# Patient Record
Sex: Male | Born: 1955 | Race: White | Hispanic: No | Marital: Married | Smoking: Never smoker
Health system: Southern US, Community
[De-identification: ages and names within clinical notes are randomized; demographics above are authoritative.]

## PROBLEM LIST (undated history)

## (undated) DIAGNOSIS — E782 Mixed hyperlipidemia: Secondary | ICD-10-CM

## (undated) DIAGNOSIS — I10 Essential (primary) hypertension: Secondary | ICD-10-CM

## (undated) DIAGNOSIS — E669 Obesity, unspecified: Secondary | ICD-10-CM

## (undated) DIAGNOSIS — K219 Gastro-esophageal reflux disease without esophagitis: Secondary | ICD-10-CM

## (undated) DIAGNOSIS — I2510 Atherosclerotic heart disease of native coronary artery without angina pectoris: Secondary | ICD-10-CM

## (undated) HISTORY — DX: Obesity, unspecified: E66.9

## (undated) HISTORY — DX: Atherosclerotic heart disease of native coronary artery without angina pectoris: I25.10

## (undated) HISTORY — DX: Essential (primary) hypertension: I10

## (undated) HISTORY — DX: Mixed hyperlipidemia: E78.2

## (undated) HISTORY — DX: Gastro-esophageal reflux disease without esophagitis: K21.9

---

## 2008-01-18 ENCOUNTER — Ambulatory Visit: Payer: Self-pay | Admitting: Cardiology

## 2008-01-18 ENCOUNTER — Inpatient Hospital Stay (HOSPITAL_COMMUNITY): Admission: EM | Admit: 2008-01-18 | Discharge: 2008-01-21 | Payer: Self-pay | Admitting: Emergency Medicine

## 2008-01-28 ENCOUNTER — Ambulatory Visit: Payer: Self-pay | Admitting: Cardiology

## 2008-02-29 ENCOUNTER — Ambulatory Visit: Payer: Self-pay | Admitting: Cardiology

## 2008-02-29 LAB — CONVERTED CEMR LAB
Bilirubin, Direct: 0.1 mg/dL (ref 0.0–0.3)
Calcium: 9.5 mg/dL (ref 8.4–10.5)
Creatinine, Ser: 0.9 mg/dL (ref 0.4–1.5)
Direct LDL: 87 mg/dL
GFR calc Af Amer: 114 mL/min
Glucose, Bld: 192 mg/dL — ABNORMAL HIGH (ref 70–99)
HDL: 32.4 mg/dL — ABNORMAL LOW (ref >39.0)
Hgb A1c MFr Bld: 10.8 % — ABNORMAL HIGH (ref 4.6–6.0)
Sodium: 139 meq/L (ref 135–145)
Total Bilirubin: 0.9 mg/dL (ref 0.3–1.2)
Total CHOL/HDL Ratio: 4.7
Total Protein: 7 g/dL (ref 6.0–8.3)
Triglycerides: 249 mg/dL (ref 0–149)

## 2008-03-16 ENCOUNTER — Ambulatory Visit: Payer: Self-pay | Admitting: Cardiology

## 2008-06-22 ENCOUNTER — Ambulatory Visit: Payer: Self-pay | Admitting: Cardiology

## 2008-06-27 ENCOUNTER — Ambulatory Visit: Payer: Self-pay | Admitting: Cardiology

## 2008-06-27 ENCOUNTER — Ambulatory Visit: Payer: Self-pay

## 2008-06-27 LAB — CONVERTED CEMR LAB
ALT: 29 units/L (ref 0–53)
AST: 24 units/L (ref 0–37)
Albumin: 3.8 g/dL (ref 3.5–5.2)
Alkaline Phosphatase: 66 units/L (ref 39–117)
BUN: 17 mg/dL (ref 6–23)
Chloride: 101 meq/L (ref 96–112)
Cholesterol: 118 mg/dL (ref 0–200)
Creatinine, Ser: 0.7 mg/dL (ref 0.4–1.5)
GFR calc non Af Amer: 126 mL/min
Glucose, Bld: 212 mg/dL — ABNORMAL HIGH (ref 70–99)
Hgb A1c MFr Bld: 9.1 % — ABNORMAL HIGH (ref 4.6–6.0)
Total Protein: 6.7 g/dL (ref 6.0–8.3)
VLDL: 42 mg/dL — ABNORMAL HIGH (ref 0–40)

## 2008-09-28 ENCOUNTER — Ambulatory Visit: Payer: Self-pay | Admitting: Cardiology

## 2008-10-28 DIAGNOSIS — D72829 Elevated white blood cell count, unspecified: Secondary | ICD-10-CM | POA: Insufficient documentation

## 2008-10-28 DIAGNOSIS — E785 Hyperlipidemia, unspecified: Secondary | ICD-10-CM

## 2008-10-28 DIAGNOSIS — E669 Obesity, unspecified: Secondary | ICD-10-CM

## 2008-10-28 DIAGNOSIS — K219 Gastro-esophageal reflux disease without esophagitis: Secondary | ICD-10-CM

## 2008-10-28 DIAGNOSIS — E1169 Type 2 diabetes mellitus with other specified complication: Secondary | ICD-10-CM | POA: Insufficient documentation

## 2008-12-22 ENCOUNTER — Encounter: Payer: Self-pay | Admitting: Internal Medicine

## 2008-12-22 ENCOUNTER — Inpatient Hospital Stay (HOSPITAL_COMMUNITY): Admission: EM | Admit: 2008-12-22 | Discharge: 2008-12-23 | Payer: Self-pay | Admitting: Emergency Medicine

## 2008-12-22 ENCOUNTER — Ambulatory Visit: Payer: Self-pay | Admitting: Internal Medicine

## 2008-12-22 HISTORY — PX: CARDIAC CATHETERIZATION: SHX172

## 2009-01-06 ENCOUNTER — Ambulatory Visit: Payer: Self-pay | Admitting: Cardiology

## 2009-03-01 ENCOUNTER — Encounter: Payer: Self-pay | Admitting: Cardiology

## 2009-03-02 ENCOUNTER — Telehealth (INDEPENDENT_AMBULATORY_CARE_PROVIDER_SITE_OTHER): Payer: Self-pay | Admitting: *Deleted

## 2009-03-07 ENCOUNTER — Telehealth: Payer: Self-pay | Admitting: Cardiology

## 2009-03-09 ENCOUNTER — Telehealth: Payer: Self-pay | Admitting: Cardiology

## 2009-03-23 ENCOUNTER — Encounter: Payer: Self-pay | Admitting: Cardiology

## 2009-03-29 ENCOUNTER — Ambulatory Visit: Payer: Self-pay | Admitting: Cardiology

## 2009-03-30 LAB — CONVERTED CEMR LAB
Albumin: 4.2 g/dL (ref 3.5–5.2)
BUN: 16 mg/dL (ref 6–23)
CO2: 30 meq/L (ref 19–32)
Calcium: 9.7 mg/dL (ref 8.4–10.5)
Creatinine, Ser: 1 mg/dL (ref 0.4–1.5)
GFR calc non Af Amer: 83.29 mL/min (ref >60)
Glucose, Bld: 225 mg/dL — ABNORMAL HIGH (ref 70–99)
Hgb A1c MFr Bld: 9 % — ABNORMAL HIGH (ref 4.6–6.5)
Sodium: 138 meq/L (ref 135–145)
Total Protein: 7.1 g/dL (ref 6.0–8.3)

## 2009-05-22 ENCOUNTER — Encounter: Payer: Self-pay | Admitting: Cardiology

## 2009-06-08 ENCOUNTER — Telehealth (INDEPENDENT_AMBULATORY_CARE_PROVIDER_SITE_OTHER): Payer: Self-pay | Admitting: *Deleted

## 2009-06-23 DIAGNOSIS — I2510 Atherosclerotic heart disease of native coronary artery without angina pectoris: Secondary | ICD-10-CM

## 2009-07-12 ENCOUNTER — Telehealth (INDEPENDENT_AMBULATORY_CARE_PROVIDER_SITE_OTHER): Payer: Self-pay | Admitting: *Deleted

## 2009-08-24 ENCOUNTER — Ambulatory Visit: Payer: Self-pay | Admitting: Cardiology

## 2009-09-26 ENCOUNTER — Telehealth (INDEPENDENT_AMBULATORY_CARE_PROVIDER_SITE_OTHER): Payer: Self-pay | Admitting: *Deleted

## 2009-12-21 ENCOUNTER — Ambulatory Visit: Payer: Self-pay | Admitting: Cardiology

## 2010-01-05 ENCOUNTER — Telehealth (INDEPENDENT_AMBULATORY_CARE_PROVIDER_SITE_OTHER): Payer: Self-pay | Admitting: *Deleted

## 2010-03-22 ENCOUNTER — Telehealth (INDEPENDENT_AMBULATORY_CARE_PROVIDER_SITE_OTHER): Payer: Self-pay | Admitting: *Deleted

## 2010-04-05 ENCOUNTER — Telehealth: Payer: Self-pay | Admitting: Cardiology

## 2010-07-12 ENCOUNTER — Ambulatory Visit: Payer: Self-pay | Admitting: Cardiology

## 2010-07-12 ENCOUNTER — Encounter: Payer: Self-pay | Admitting: Cardiology

## 2010-07-12 DIAGNOSIS — I10 Essential (primary) hypertension: Secondary | ICD-10-CM

## 2010-08-21 NOTE — Progress Notes (Signed)
   Walk in Patient Form Recieved " Pt left [employer] form for completion" sent to Maryan Puls Mesiemore  March 22, 2010 12:39 PM

## 2010-08-21 NOTE — Assessment & Plan Note (Signed)
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  History of Present Illness: Mr. Mccarrick returns today for evaluation and management of his coronary disease.  Was driving back from [place] and began to have problems with feeling hot and cold and had some nausea. He was admitted to the hospital and told this was probably a virus. He had a stress dobutamine echo which showed ejection fraction of 60% that increased to 85% with greater than 85% of his maximum predicted heart rate being obtained. There was no evidence of ischemia or infarction.  His blood sugars were averaging around 200+ in the hospital. His last hemoglobin A1c that I obtained in September 2002 and was 9%. I reviewed this thoroughly with him today and the importance of tightness. His other risk factors other than his weight seemed to be well treated.  He currently has no symptoms of angina or ischemia  Current Medications (verified): 1)  Aspirin 81 Mg Tbec (Aspirin) .... Take One Tablet By Mouth Daily 2)  Metoprolol Tartrate 25 Mg Tabs (Metoprolol Tartrate) .[name].. 1 Tab Two Times A Day 3)  Plavix 75 Mg Tabs (Clopidogrel Bisulfate) .[name].. 1 Tab Once Daily 4)  Lisinopril 20 Mg Tabs (Lisinopril) .[name].. 1 Tab Once Daily 5)  Glipizide Xl 10 Mg Xr24h-Tab (Glipizide) .[name].. 1 Tab Once Daily 6)  Crestor 40 Mg Tabs (Rosuvastatin Calcium) .... Take One Tablet By Mouth Daily 7)  Metformin Hcl 500 Mg Xr24h-Tab (Metformin Hcl) .... 2 Tabs Two Times A Day  Allergies: 1)  ! Pcn  Past History:  Past Medical History: Last updated: 01/06/2009 CAD      a.  S/P NSTEMI JULY 2009      b.  S/P BMS TO RCA  JULY 2009      c.  S/P XIENCE DES TO OM2 JUNE 2010.  OTW NONOBS DZS AND PATENT RCA STENT, EF 65% HYPERLIPIDEMIA-MIXED (ICD-272.4) OBESITY (ICD-278.00) GERD (ICD-530.81) DM (ICD-250.00) HTN    Family History: Last updated: 01/04/2009  Mother is deceased at the age 9 with the cerebral   aneurysm and history of diabetes.  Father died at the age of 48 with   history of COPD and bone and lung cancer.  He has one sister, who is   currently being treated for lymphoma.   Social History: Last updated: 2009-01-04 + h/o tobacco, now quit  Review of Systems       negative other than history of present illness  Vital Signs:  Patient profile:   55 year old male Height:      70 inches Weight:      267 pounds BMI:     38.45 Pulse rate:   72 / minute Pulse rhythm:   regular BP sitting:   100 / 66  (left arm) Cuff size:   large  Vitals Entered By: Danielle Rankin, CMA (December 21, 2009 8:47 AM)  Physical Exam  General:  anxious, no acute distress otherwise. Head:  long beard Eyes:  PERRLA/EOM intact; conjunctiva and lids normal. Neck:  Neck supple, no JVD. No masses, thyromegaly or abnormal cervical nodes.  Chest Evian Derringer:  no deformities or breast masses noted Lungs:  Clear bilaterally to auscultation and percussion. Heart:  Non-displaced PMI, chest non-tender; regular rate and rhythm, S1, S2 without murmurs, rubs or gallops. Carotid upstroke normal, no bruit. Normal abdominal aortic size, no bruits. Femorals normal pulses, no bruits. Pedals normal pulses. No edema, no varicosities. Msk:  Back normal, normal gait. Muscle strength and tone normal. Pulses:  pulses normal in all 4 extremities Extremities:  No clubbing or cyanosis. Neurologic:  Alert and oriented x 3. Skin:  Intact without lesions or rashes. Psych:  Normal affect.   Impression & Recommendations:  Problem # 1:  CAD, NATIVE VESSEL (ICD-414.01) Assessment Unchanged I reviewed his outside dobutamine stress echo report with him.  His ejection fraction was 60% increasing to 85%. The test was adequate. There was no evidence of ischemia or infarction or Seyed Heffley motion abnormality. The biggest issues continue to try to control his blood sugar. He will continue to work with his primary care to get his hemoglobin A1c at goal. Per his history, his lipids are really low. He is compliant with his medications. No changes made. I will see him back in 6 months. His updated medication list for this problem includes:    Aspirin 81 Mg Tbec (Aspirin) .[name]... Take one tablet by mouth daily    Metoprolol Tartrate 25 Mg Tabs (Metoprolol tartrate) .[name]... 1 tab two times a day    Plavix 75 Mg Tabs (Clopidogrel bisulfate) .[name]... 1 tab once daily    Lisinopril 20 Mg Tabs (Lisinopril) .[name]... 1 tab once daily  Problem # 2:  HYPERLIPIDEMIA-MIXED (ICD-272.4)  His updated medication list for this problem includes:    Crestor 40 Mg Tabs (Rosuvastatin calcium) .[name]... Take one tablet by mouth daily  Problem # 3:  OBESITY (ICD-278.00) Assessment: Unchanged  Problem # 4:  DM (ICD-250.00) Assessment: Deteriorated  His updated medication list for this problem includes:    Aspirin 81 Mg Tbec (Aspirin) .[name]... Take one tablet by mouth daily    Lisinopril 20 Mg Tabs (Lisinopril) .[name]... 1 tab once daily    Glipizide Xl 10 Mg Xr24h-tab (Glipizide) .[name]... 1 tab once daily    Metformin Hcl 500 Mg Xr24h-tab (Metformin hcl) .[name]... 2 tabs two times a day  Patient Instructions: 1)  Your physician recommends that you schedule a follow-up appointment in: 6 months with dr Bush Murdoch 2)  Your physician recommends that you continue on your current medications as directed. Please refer to the Current Medication list given to you today.

## 2010-08-21 NOTE — Progress Notes (Signed)
Summary: calling back   Phone Note Outgoing Call   Call placed by: Ollen Gross, RN, BSN,  March 22, 2010 3:18 PM Call placed to: Patient Details for Reason: Walk-in patient note. Summary of Call: Needs forms completed asap. Initial call taken by: Ollen Gross, RN, BSN,  March 22, 2010 3:19 PM  Follow-up for Phone Call        Spoke with pt's wife to let pt/wife know that we received the forms for requesting assistance with two medications. I will give the forms to Debbie Dr. Vern Claude nurse. She and MD will be here tomorrow. Pt's wife verbalized understanding. Follow-up by: Ollen Gross, RN, BSN,  March 22, 2010 3:24 PM  Additional Follow-up for Phone Call Additional follow up Details #1::        Per pt wife calling back sylbil Charo [phone] .cell [phone] Lorne Skeens  March 23, 2010 10:26 AM    Mr. Peerson wife will pick up forms today. Mylo Red RN

## 2010-08-21 NOTE — Progress Notes (Signed)
   Phone Note Outgoing Call   Call placed by: Scherrie Bateman, LPN,  September 26, 2009 2:55 PM Call placed to: Patient'S  WIFE Summary of Call: CALL PLACED LEFT WORD WITH PT'S WIFE PLAVIX HERE WILL LEAVE AT FRONT DESK FOR PICK UP. Initial call taken by: Scherrie Bateman, LPN,  September 26, 2009 2:56 PM

## 2010-08-21 NOTE — Progress Notes (Signed)
   Phone Note Outgoing Call   Call placed by: Scherrie Bateman, LPN,  January 05, 2010 3:34 PM Call placed to: Patient's wife Summary of Call: PT'S WIFE AWARE [employer] LEFT AT FRONT DESK FOR PICK UP. Initial call taken by: Scherrie Bateman, LPN,  January 05, 2010 3:35 PM

## 2010-08-21 NOTE — Assessment & Plan Note (Signed)
Summary: [id number]/[id number]      Allergies Added: ! PCN  Visit Type:  3 mo f/u Primary Provider:  Yvonne Kendall  CC:  no cardiac complaints today.  History of Present Illness: Mr Nadeem returns today for evaluation manages coronary artery disease.  He's working down in [place] as he says he's bless with work. He is having no symptoms of angina or ischemia. He remains on Plavix. I thought he had a bare-metal stent placed in June but with a drug-eluting stent. A nurse in the hospital followed up with him and told him to stay on his Plavix.  He is a previous bare-metal stent in the right coronary artery and a drug-eluting stent in the circumflex marginal. He will need to be on Plavix until June of 2011.  His last cholesterol was 122, HDL 34 LDL 43. His hemoglobin A1c is high and his physician in [place] is working on this.  Current Medications (verified): 1)  Aspirin 81 Mg Tbec (Aspirin) .... Take One Tablet By Mouth Daily 2)  Metoprolol Tartrate 25 Mg Tabs (Metoprolol Tartrate) .[name].. 1 Tab Two Times A Day 3)  Plavix 75 Mg Tabs (Clopidogrel Bisulfate) .[name].. 1 Tab Once Daily 4)  Lisinopril 20 Mg Tabs (Lisinopril) .[name].. 1 Tab Once Daily 5)  Glipizide Xl 10 Mg Xr24h-Tab (Glipizide) .[name].. 1 Tab Once Daily 6)  Crestor 40 Mg Tabs (Rosuvastatin Calcium) .... Take One Tablet By Mouth Daily 7)  Metformin Hcl 500 Mg Xr24h-Tab (Metformin Hcl) .... Take One Tablet By Mouth Twice A Day  Allergies (verified): 1)  ! Pcn  Past History:  Past Medical History: Last updated: 01/06/2009 CAD      a.  S/P NSTEMI JULY 2009      b.  S/P BMS TO RCA JULY 2009      c.  S/P XIENCE DES TO OM2 JUNE 2010.  OTW NONOBS DZS AND PATENT RCA STENT, EF 65% HYPERLIPIDEMIA-MIXED (ICD-272.4) OBESITY (ICD-278.00) GERD (ICD-530.81) DM (ICD-250.00) HTN    Family History: Last updated: 01-03-2009  Mother is deceased at the age 34 with the cerebral   aneurysm and history of diabetes.  Father died at the age of 40 with   history  of COPD and bone and lung cancer.  He has one sister, who is   currently being treated for lymphoma.   Social History: Last updated: 01/03/09 + h/o tobacco, now quit  Review of Systems       negative other than history of present illness  Vital Signs:  Patient profile:   55 year old male Height:      70 inches Weight:      272 pounds BMI:     39.17 Pulse rate:   66 / minute Pulse rhythm:   regular BP sitting:   100 / 70  (left arm) Cuff size:   large  Vitals Entered By: Danielle Rankin, CMA (August 24, 2009 2:34 PM)  Physical Exam  General:  obese.  long beard Head:  normocephalic and atraumatic Eyes:  PERRLA/EOM intact; conjunctiva and lids normal. Neck:  Neck supple, no JVD. No masses, thyromegaly or abnormal cervical nodes. Lungs:  Clear bilaterally to auscultation and percussion. Heart:  Non-displaced PMI, chest non-tender; regular rate and rhythm, S1, S2 without murmurs, rubs or gallops. Carotid upstroke normal, no bruit. Normal abdominal aortic size, no bruits. Femorals normal pulses, no bruits. Pedals normal pulses. No edema, no varicosities. Msk:  Back normal, normal gait. Muscle strength and tone normal. Pulses:  pulses normal in  all 4 extremities Extremities:  No clubbing or cyanosis. Neurologic:  Alert and oriented x 3. Skin:  Intact without lesions or rashes. Psych:  Normal affect.   Impression & Recommendations:  Problem # 1:  CAD, NATIVE VESSEL (ICD-414.01)  His updated medication list for this problem includes:    Aspirin 81 Mg Tbec (Aspirin) .[name]... Take one tablet by mouth daily    Metoprolol Tartrate 25 Mg Tabs (Metoprolol tartrate) .[name]... 1 tab two times a day    Plavix 75 Mg Tabs (Clopidogrel bisulfate) .[name]... 1 tab once daily    Lisinopril 20 Mg Tabs (Lisinopril) .[name]... 1 tab once daily  Patient Instructions: 1)  Your physician recommends that you schedule a follow-up appointment in: JUNE 2011 WITH DR Jeffrey Graefe 2)  Your physician recommends that you  continue on your current medications as directed. Please refer to the Current Medication list given to you today.

## 2010-08-21 NOTE — Progress Notes (Signed)
Summary: Plavix- BMS   Phone Note Outgoing Call   Call placed by: Sherri Rad, RN, BSN,  April 05, 2010 3:42 PM Call placed to: Patient Summary of Call: I spoke with the pt's wife. I made her aware that the pt's Plavix has been received from the company and is at the front desk for p/u.  Initial call taken by: Sherri Rad, RN, BSN,  April 05, 2010 3:43 PM

## 2010-08-23 NOTE — Assessment & Plan Note (Signed)
Summary: 6 MO F/U ./CY    Visit Type:  6 mo f/u Primary Chad Maddox:  Yvonne Kendall  CC:  pt denies any cardiac complaints today.  History of Present Illness: Mr Word comes in today for followup of his coronary disease. He has had no angina or ischemic symptoms. He states he comply with his medications. His weight is not changed. He is in [place] most of time working. He has not been in 9 weeks. He is going back to day.  He denies any orthopnea, PND or edema  Clinical Reports Reviewed:  Cardiac Cath:  12/22/2008: Cardiac Cath Findings:   Left ventricular angiogram was performed in the RAO projection       shows normal left ventricular systolic function with no wall motion       abnormalities.  Ejection fraction is 65%.      IMPRESSION:   1. Triple-vessel coronary artery disease.   2. Patent stent in the proximal right coronary artery.   3. Severe stenosis of the second obtuse marginal branch.   4. Successful percutaneous coronary intervention with placement of a       Xience drug-eluting stent in the second obtuse marginal branch.   5. Normal left ventricular systolic function.      RECOMMENDATIONS:  I recommend continue aspirin and Plavix for at least   the next year.  The patient will also be continued on his statin and his   beta blocker.  He will be monitored closely in the telemetry unit   tonight and will be discharged home tomorrow if he is stable.               Verne Carrow, MD   Electronically Signed         01/20/2008: Cardiac Cath Findings:   Cardiac Findings:  , LVEF Status: LV Gram - 65 % EF, LV Wall Motion: Normal  Dominance: Right  Tubular 50% proximal lesion in LAD  Tubular 70% mid lesion in LAD  Tubular 95% ostial lesion in DIAG2  Tubular 40% proximal lesion in CIRC  Tubular 70% mid lesion in CIRC  Tubular 95% proximal lesion in RCA  Tubular 30% distal lesion in RCA  Conclusions:  1.  NSTEMI with 3V CAD wtih 50% proximal and 70% mid LAD  stensosis with 90%  subbranch Dx2, 40% proximal and 70% distal CFX stenosis, and 95% proximal and  30% distal RCA stenosis.  2.  Normal LVF with EF 65%  3.  Successfull PCI proximal RCA lesion with a BMS with improvement in  stenosis from 95% to 0%.  Recommendations:  Plavix for one year.  Intensive secondary prevention -- large plaque burden.  Signed By: Charlies Constable  MD On 01/20/2008 10:08:46  Charlies Constable  MD  cc Dr Juanito Doom  [facility]  Dr Charlies Constable  Nuclear Study:  06/27/2008:  Excerise capacity: Adenosine with low level exercise  Blood Pressure response:  Clinical symptoms:  ECG impression: Baseline: NSR; No significant ST segment change with adenosine and low-level exercise.  Overall impression: Normal stress nuclear study  Arvilla Meres, MD   Current Medications (verified): 1)  Aspirin 81 Mg Tbec (Aspirin) .... Take One Tablet By Mouth Daily 2)  Metoprolol Tartrate 25 Mg Tabs (Metoprolol Tartrate) .[name].. 1 Tab Two Times A Day 3)  Plavix 75 Mg Tabs (Clopidogrel Bisulfate) .[name].. 1 Tab Once Daily 4)  Lisinopril 20 Mg Tabs (Lisinopril) .[name].. 1 Tab Once Daily 5)  Glipizide Xl 10 Mg Xr24h-Tab (Glipizide) .Marland KitchenMarland KitchenMarland Kitchen  1 Tab Once Daily 6)  Crestor 40 Mg Tabs (Rosuvastatin Calcium) .... Take One Tablet By Mouth Daily 7)  Metformin Hcl 500 Mg Xr24h-Tab (Metformin Hcl) .... 2 Tabs Two Times A Day 8)  Nitrostat 0.4 Mg Subl (Nitroglycerin) .[name].. 1 Tablet Under Tongue At Onset of Chest Pain; You May Repeat Every 5 Minutes For Up To 3 Doses.  Allergies: 1)  ! Pcn  Past History:  Past Medical History: Last updated: 01/06/2009 CAD      a.  S/P NSTEMI JULY 2009      b.  S/P BMS TO RCA JULY 2009      c.  S/P XIENCE DES TO OM2 JUNE 2010.  OTW NONOBS DZS AND PATENT RCA STENT, EF 65% HYPERLIPIDEMIA-MIXED (ICD-272.4) OBESITY (ICD-278.00) GERD (ICD-530.81) DM (ICD-250.00) HTN    Family History: Last updated: Jan 09, 2009  Mother is deceased at the age 26 with the cerebral    aneurysm and history of diabetes.  Father died at the age of 67 with   history of COPD and bone and lung cancer.  He has one sister, who is   currently being treated for lymphoma.   Social History: Last updated: January 09, 2009 + h/o tobacco, now quit  Review of Systems       negative other than history of present illness  Vital Signs:  Patient profile:   55 year old male Height:      70 inches Weight:      276.25 pounds BMI:     39.78 Pulse rate:   85 / minute Pulse rhythm:   regular BP sitting:   132 / 86  (left arm) Cuff size:   large  Vitals Entered By: Danielle Rankin, CMA (July 12, 2010 10:09 AM)  Physical Exam  General:  obese.  no acute distress Head:  long beard Eyes:  PERRLA/EOM intact; conjunctiva and lids normal. Neck:  Neck supple, no JVD. No masses, thyromegaly or abnormal cervical nodes. Lungs:  Clear bilaterally to auscultation and percussion. Heart:  PMI nondisplaced, regular rate and rhythm, normal S1-S2, no carotid bruits Msk:  Back normal, normal gait. Muscle strength and tone normal. Pulses:  pulses normal in all 4 extremities Extremities:  No clubbing or cyanosis. Neurologic:  Alert and oriented x 3. Skin:  Intact without lesions or rashes. Psych:  Normal affect.   Problems:  Medical Problems Added: 1)  Dx of Hypertension, Benign  (ICD-401.1)  Impression & Recommendations:  Problem # 1:  CAD, NATIVE VESSEL (ICD-414.01) Assessment Unchanged  His updated medication list for this problem includes:    Aspirin 81 Mg Tbec (Aspirin) .[name]... Take one tablet by mouth daily    Metoprolol Tartrate 25 Mg Tabs (Metoprolol tartrate) .[name]... 1 tab two times a day    Plavix 75 Mg Tabs (Clopidogrel bisulfate) .[name]... 1 tab once daily    Lisinopril 20 Mg Tabs (Lisinopril) .[name]... 1 tab once daily    Nitrostat 0.4 Mg Subl (Nitroglycerin) .[name]... 1 tablet under tongue at onset of chest pain; you may repeat every 5 minutes for up to 3 doses.  Orders: EKG w/ Interpretation  (93000)  Problem # 2:  OBESITY (ICD-278.00) Assessment: Unchanged  Problem # 3:  HYPERTENSION, BENIGN (ICD-401.1) Assessment: Unchanged  His updated medication list for this problem includes:    Aspirin 81 Mg Tbec (Aspirin) .[name]... Take one tablet by mouth daily    Metoprolol Tartrate 25 Mg Tabs (Metoprolol tartrate) .[name]... 1 tab two times a day    Lisinopril 20 Mg Tabs (  Lisinopril) .[name]... 1 tab once daily  Patient Instructions: 1)  Your physician recommends that you schedule a follow-up appointment in: 6 months with Dr. Daleen Squibb 2)  Your physician recommends that you continue on your current medications as directed. Please refer to the Current Medication list given to you today.

## 2010-10-29 LAB — COMPREHENSIVE METABOLIC PANEL
ALT: 30 U/L (ref 0–53)
AST: 21 U/L (ref 0–37)
CO2: 30 mEq/L (ref 19–32)
Calcium: 9.5 mg/dL (ref 8.4–10.5)
Chloride: 98 mEq/L (ref 96–112)
Creatinine, Ser: 1.07 mg/dL (ref 0.4–1.5)
GFR calc Af Amer: >60 mL/min (ref >60)
GFR calc non Af Amer: >60 mL/min (ref >60)
Glucose, Bld: 160 mg/dL — ABNORMAL HIGH (ref 70–99)
Sodium: 134 mEq/L — ABNORMAL LOW (ref 135–145)
Total Bilirubin: 0.7 mg/dL (ref 0.3–1.2)

## 2010-10-29 LAB — BASIC METABOLIC PANEL
BUN: 17 mg/dL (ref 6–23)
Calcium: 9.3 mg/dL (ref 8.4–10.5)
GFR calc non Af Amer: >60 mL/min (ref >60)
Glucose, Bld: 197 mg/dL — ABNORMAL HIGH (ref 70–99)

## 2010-10-29 LAB — CBC
HCT: 48.9 % (ref 39.0–52.0)
Hemoglobin: 15.7 g/dL (ref 13.0–17.0)
MCHC: 32.1 g/dL (ref 30.0–36.0)
MCV: 80.2 fL (ref 78.0–100.0)
Platelets: 216 10*3/uL (ref 150–400)
RDW: 14.6 % (ref 11.5–15.5)

## 2010-10-29 LAB — GLUCOSE, CAPILLARY
Glucose-Capillary: 143 mg/dL — ABNORMAL HIGH (ref 70–99)
Glucose-Capillary: 193 mg/dL — ABNORMAL HIGH (ref 70–99)
Glucose-Capillary: 205 mg/dL — ABNORMAL HIGH (ref 70–99)

## 2010-10-29 LAB — POCT I-STAT, CHEM 8
Chloride: 101 mEq/L (ref 96–112)
Glucose, Bld: 205 mg/dL — ABNORMAL HIGH (ref 70–99)
HCT: 49 % (ref 39.0–52.0)
Hemoglobin: 16.7 g/dL (ref 13.0–17.0)
Potassium: 4.2 mEq/L (ref 3.5–5.1)
Sodium: 132 mEq/L — ABNORMAL LOW (ref 135–145)

## 2010-10-29 LAB — LIPID PANEL: VLDL: 53 mg/dL — ABNORMAL HIGH (ref 0–40)

## 2010-10-29 LAB — CARDIAC PANEL(CRET KIN+CKTOT+MB+TROPI)
Relative Index: INVALID (ref 0.0–2.5)
Troponin I: 0.02 ng/mL (ref 0.00–0.06)
Troponin I: 0.02 ng/mL (ref 0.00–0.06)

## 2010-12-04 NOTE — Discharge Summary (Signed)
NAME:  Chad Maddox, Chad Maddox NO.:  [id number]   MEDICAL RECORD NO.:  [id number]          PATIENT TYPE:  INP   LOCATION:  3713                         FACILITY:  [facility]   PHYSICIAN:  Ladell Pier, M.D.   DATE OF BIRTH:  [birth year]   DATE OF ADMISSION:  01/18/2008  DATE OF DISCHARGE:  01/21/2008                               DISCHARGE SUMMARY   DISCHARGE DIAGNOSES:  1. Chest pain/coronary artery disease.  2. New-onset diabetes.  3. Hypertension.  4. Dyslipidemia.  5. Remote tobacco use.  6. Obesity.  7. Leukocytosis.   DISCHARGE MEDICATIONS:  1. Aspirin 81 mg daily.  2. Crestor 40 mg nightly.  3. Metoprolol 25 mg b.i.d.  4. Plavix 75 mg daily.  5. Lisinopril 20 mg daily.  6. Glucotrol XL 10 mg daily.  7. Prilosec OTC 20 mg daily.  8. Plavix for one year.   FOLLOW-UP APPOINTMENTS:  The patient will follow up with his wife and  primary care doctor.  He has not seen a doctor in 30 years.  Follow-up  with cardiology to be made.   PROCEDURES:  The patient had cardiac catheterization on July 1 by Dr.  Juanda Chance.  Cardiac catheterization showed three-vessel coronary artery  disease, 50% proximal and 70% mid LAD stenosis, 90% sub branch, 40%  proximal, 70% distal circumflex stenosis, and 95% proximal, and 30%  distal RCA stenosis, normal LVEF.  PCI of the proximal RCA lesion with  BMS bare metal stent, with improvement in stenosis from 95 to 0%.   CONSULTANTS:  Cardiology, Dr. Daleen Squibb.   HISTORY OF PRESENT ILLNESS:  The patient is a 55 year old white male  with no significant past medical history presented to the emergency room  with chest pain.  The symptoms have been on and off for 2 days.  He had  no cough, fevers, or chills.   PAST MEDICAL HISTORY, FAMILY HISTORY, SOCIAL HISTORY, MEDICATIONS,  ALLERGIES, REVIEW OF SYSTEMS:  Per admission H&P.   PHYSICAL EXAMINATION ON DISCHARGE:  Temperature 97.7, pulse 76,  respirations 20, blood pressure 126/85, pulse ox 98% on  room air.  CBG  199.  HEENT:  Head is normocephalic, atraumatic.  Pupils equal, round, and  reactive to light.  Throat without erythema.  CARDIOVASCULAR:  Regular rate and rhythm.  LUNGS:  Clear bilaterally.  ABDOMEN:  Positive bowel sounds.  EXTREMITIES:  Without edema.   HOSPITAL COURSE:  1. Chest pain:  The patient was admitted to the hospital, had blood      work done that showed elevated cardiac enzymes.  Cardiology was      consulted.  He was placed on the appropriate medication, had      cardiac catheterization with stenting done.  The patient was doing      fine with procedures and will be discharged home to follow up with      primary care physician and cardiology outpatient.  2. Dyslipidemia:  On admitting the patient and doing lab work, he was      found to have elevated cholesterol, triglycerides with 716, total      cholesterol was  306.  The patient was started on Crestor.  He      should follow up with his primary care doctor and cardiology for      further management and to recheck his liver function tests.  3. Hypertension:  The patient's blood pressure was also noted to be      elevated inpatient.  He was started on metoprolol and ACE inhibitor      as well.  His blood pressure is well controlled prior to discharge.  4. Obesity:  Discussed with him diet and exercise.  5. Gastroesophageal reflux disease:  He had some problem with      indigestion and was told to take over-the-counter Prilosec.  6. Leukocytosis.  The lab also showed some mild leukocytosis.  He will      follow up for CBC check with primary care physician.   DISCHARGE LABORATORIES:  Sodium 137, potassium 4.24102, CO2 28, glucose  237, BUN 12, and creatinine 21, WBC 12.8, hemoglobin 14.9, and platelets  231.  Blood cultures negative x2.  TSH 1.620, hemoglobin A1c of 12.2.  Homocysteine level of 10.5.  total cholesterol 306, triglyceride of 716,  HDL of 35.  D-dimer less than 0.22.  Cardiac panel, CK 86,  MB 5.0,  troponin 0.09.  Chest x-ray showed no active cardiopulmonary process.      Ladell Pier, M.D.  Electronically Signed     [place]/MEDQ  D:  01/21/2008  T:  01/22/2008  Job:  [id number]   cc:   Thomas C. Wall, MD, [facility]

## 2010-12-04 NOTE — H&P (Signed)
NAME:  Chad Maddox, Chad Maddox NO.:  [id number]   MEDICAL RECORD NO.:  [id number]          PATIENT TYPE:  OBV   LOCATION:  4733                         FACILITY:  [facility]   PHYSICIAN:  Beckey Rutter, MD  DATE OF BIRTH:  1955-07-29   DATE OF ADMISSION:  01/18/2008  DATE OF DISCHARGE:                              HISTORY & PHYSICAL   PRIMARY CARE PHYSICIAN:  Unassigned [facility].   CHIEF COMPLAINT:  Chest tightness.   HISTORY OF PRESENT ILLNESS:  This is a 55 year old pleasant Caucasian  male with no significant past medical history who presented today with  chest discomfort.  The patient could not give a good account to his  chest discomfort, but he described it as if he expanded his lungs and he  feels discomfort afterwards.  These symptoms have been on and off for  the last 2 days, it stayed for 30 minutes, has no association whatsoever  and definitely not associated with exertion.  The patient denied cough,  fever, or any other symptoms.   PAST MEDICAL HISTORY:  The patient has not seen a doctor for the last 30  years.  He remembered he has walking pneumonia at that time in his  early 49s, but otherwise, no new medical history.   FAMILY HISTORY:  His father died of lung cancer, mother of brain  aneurysm.   SOCIAL HISTORY:  Lives with his wife.  Denied drug abuse, ethanol abuse,  or smoking.   MEDICATION ALLERGIES:  PENICILLIN.   MEDICATIONS:  None.   REVIEW OF SYSTEMS:  Twelve-point review of system is noncontributory.   PHYSICAL EXAMINATION:  VITAL SIGNS:  On examination, temperature 97.2,  pulse 112, and blood pressure 159/95.  HEAD:  Atraumatic and normocephalic.  EYES:  PERRL.  MOUTH:  Moist.  No ulcer.  NECK:  Supple.  No JVD.  CARDIAC:  First and second heart sounds audible.  No added sounds.  LUNGS:  Bilateral air entry.  No rhonchi or wheezes.  No adventitious  sounds.  ABDOMEN:  Obese and nontender.  Bowel sounds present.  EXTREMITIES:  No  lower extremities edema.  NEUROLOGIC:  Alert and oriented x3.  Moving all his extremities and  given history.  GENITOURINARY:  No CVA tenderness.   LABS AND X-RAY:  His troponin is 0.05, myoglobin is 44.5, and CK-MB is  2.6.  Microscopic urine showing granular cast with RBCs 0-5.  Urine is  yellow and clear.  Negative nitrate and leukocyte esterase.  D-dimer is  less than 0.22 negative.  Second cardiac markers showing 0.05 troponin  and myoglobin is 50.9.  White blood count is 16.5, hemoglobin is 16.4,  hematocrit 50.5, platelet count is 663, and neutrophil percentage is 82.  Sodium is 133, potassium 4.0, chloride 102, glucose 313, BUN 11, and  creatinine 0.7.  Chest x-ray was showing heart, mediastinal contours  normal.  Lungs, clear except for probable small calcified granuloma  projecting over the right midlung.  Osseous structure intact in one  view.   ASSESSMENT:  1. This is 55 year old with chest pressure.  Need to be ruled out  for      acute coronary syndrome since he has no stratification or history      of chest pain like this before.  2. Obesity.  3. Hypertension.  4. Diabetes seems to be newly discovered/new onset.   ASSESSMENT:  1. The patient will be admitted to telemetry to rule out acute      coronary syndrome/myocardial infarction.  2. For diabetes, we will check hemoglobin A1c.  We will have the      patient see registered dietitian and diabetic educator during the      hospital course.  3. Hypertension.  We will probably start the patient on beta-      blocker/ACE inhibitor during this hospital and we will continue to      monitor the blood pressure and adjust the management according to      the hospital course.  4. Obesity.  The patient is counseled and he will have registered      dietitian consultation during the stay.  5. For gastrointestinal prophylaxis, I will consider Protonix.  6. For deep venous thrombosis prophylaxis, I will start Lovenox.  The       patient will be started on aspirin today.      Beckey Rutter, MD  Electronically Signed     EME/MEDQ  D:  01/18/2008  T:  01/19/2008  Job:  [id number]
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
## 2010-12-04 NOTE — Assessment & Plan Note (Signed)
[facility]                            CARDIOLOGY OFFICE NOTE   ZEKIEL, TORIAN                       MRN:          [id number]  DATE:06/22/2008                            DOB:          1955-10-14    Mr. Glander comes in today for followup.  He is extremely frustrated  that he cannot lose weight.  He went over his diet in detail today with  me and he obviously is being extremely careful.  Unfortunately, his  weight has increased from 267 to 272.   He had initial drop in his hemoglobin A1c from 12.2 to 10.8.  He is due  a repeat.   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  He  states he is blessed to have a job.   He has had no angina and no further dyspnea on exertion.  Please see my  note from March 16, 2008 for problem list.   His meds were unchanged since last visit.  Please refer to his  medication list and to my note.   His blood pressure is currently 118/80, pulse is 84 and regular.  His  weight is 272.  HEENT, he has his full beard.  No change, otherwise.  Carotids are full without bruits.  Lungs are clear to auscultation and  percussion.  Heart reveals soft S1 and S2, poorly appreciated PMI.  Abdominal exam is protuberant and good bowel sounds.  Organomegaly could  not be adequately assessed.  Extremities reveal no edema.  Pulses are  intact.   He has had an excellent response to Crestor 40, going from 306 total  cholesterol to 152.  His triglycerides dropped from 716 to 249.  His HDL  stayed low at 32.4, and his LDL dropped to 87.   ASSESSMENT AND PLAN:  1. Adenosine rest stress Myoview.  Follow up on patency of his bare-      metal stent.  2. Continue Plavix for now.  If cost becomes an issue, we can probably      stop it, if his stress Myoview is negative.  3. The morning he comes, obtain fasting CMP, lipids, and hemoglobin      A1c.  4. Continue to emphasize diet.  5. See me back again in 6  months.     Thomas C. Daleen Squibb, MD, [facility]  Electronically Signed    TCW/MedQ  DD: 06/22/2008  DT: 06/22/2008  Job #: [id number]

## 2010-12-04 NOTE — Discharge Summary (Signed)
NAME:  Chad Maddox, Chad Maddox NO.:  [id number]   MEDICAL RECORD NO.:  [id number]          PATIENT TYPE:  INP   LOCATION:  4740                         FACILITY:  [facility]   PHYSICIAN:  Thomas C. Wall, MD, FACCDATE OF BIRTH:  [birth year]   DATE OF ADMISSION:  12/22/2008  DATE OF DISCHARGE:  12/23/2008                               DISCHARGE SUMMARY   DISCHARGE DIAGNOSES:  1. Coronary artery disease, status post cardiac catheterization this      admission.  The patient found to have triple-vessel coronary artery      disease with a patent stent in the proximal right coronary artery      and severe stenosis of the second obtuse marginal branch.  He      underwent successful percutaneous coronary intervention with      placement of a Xience drug-eluting stent in the second obtuse      marginal branch.  The patient was noted to have normal left      ventricular systolic function.  2. Poorly controlled diabetes with the initiation of metformin at time      of discharge.  The patient instructed to start medication on June      2010, secondary to recent dye use.   PAST MEDICAL HISTORY:  1. Coronary artery disease.  2. Previous bare metal stent to the RCA.  3. Dyslipidemia.  4. Obesity.  5. GERD.  6. Diabetes.  7. Hypertension.   [name] was seen in the office by Dr. Johney Frame on December 22, 2008, for  chest discomfort.  The patient walked in for evaluation of chest  discomfort and was transferred to [facility] for further evaluation.  The patient's EKG without change from previous EKGs consistent with ST  elevation in II, III, aVF, and V4 through V6.  Sinus rhythm at 76.  The  patient's pain was stable.  EKG without change from previous, therefore,  a code STEMI was not called.   Lab work confirmed no myocardial infarction event.  The patient  proceeded to the cath lab with results as stated above.  The patient  tolerated procedure without complications.  Dr. Daleen Squibb in to see  the  patient on day of discharge.  The patient's hemoglobin A1c back in March  was 9.5, and CBGs this admission showing numbers close to the 200,  therefore cardiac rehabilitation education for diabetic teaching was  provided along with heart-healthy diet and Dr. Daleen Squibb prescribed metformin  500 mg b.i.d. for treatment.  The patient to follow up in the office  with one of the physician extenders as Dr. Daleen Squibb does not have an  appointment available until late August, so he will be seen on January 06, 2009, at 8:45.  He has been given the post cardiac catheterization  discharge instructions.  Prescription for  metformin 500 mg b.i.d. to start on December 25, 2008.  To resume Plavix 75,  aspirin 81, Crestor 80, glipizide 10, lisinopril 20, metoprolol 25  b.i.d., and nitroglycerin as needed.  There is not a primary care  physician listed to CC to.   Time  of dictation is over 30 minutes with the patient education.      Dorian Pod, ACNP      Jesse Sans. Daleen Squibb, MD, [facility]  Electronically Signed    MB/MEDQ  D:  12/23/2008  T:  12/24/2008  Job:  [id number]

## 2010-12-04 NOTE — Assessment & Plan Note (Signed)
[facility]                            CARDIOLOGY OFFICE NOTE   Chad Maddox, Chad Maddox                       MRN:          [id number]  DATE:09/28/2008                            DOB:          03/08/1956    Chad Maddox comes in today for followup.  He has been working jobs down  in [place] and [place].  He says I am blessed to have work, and  he is right.   He is having no symptoms of angina.  His stress Myoview on June 27, 2008, showed no ischemia, EF of 57%.  He remains on Plavix which he says  he is able to afford.   His medicines are unchanged since his last visit.   He says his blood sugars have been under better control.  His weight has  not changed; however, he has not lost any weight.   PHYSICAL EXAMINATION:  VITAL SIGNS:  His blood pressure today is 122/84,  his pulse is 82 and regular, weight is 273.  HEENT:  Unchanged.  He has a heavy beard.  NECK:  Could not be assessed because of his beard except he has no  carotid bruits.  Thyroid is not enlarged.  LUNGS:  Clear to auscultation and percussion.  HEART:  A regular rate and rhythm.  No gallop.  ABDOMEN:  Soft, good bowel sounds.  EXTREMITIES:  No cyanosis, clubbing, or edema.  Pulses are intact.   ASSESSMENT AND PLAN:  Chad Maddox is doing well.  We will check a  hemoglobin A1c.  I have made no changes in his medical program.  We will  plan on seeing him back again in 6 months.     Thomas C. Daleen Squibb, MD, [facility]  Electronically Signed    TCW/MedQ  DD: 09/28/2008  DT: 09/28/2008  Job #: [id number]   cc:   Dewayne Shorter, MD

## 2010-12-04 NOTE — Cardiovascular Report (Signed)
NAME:  Chad Maddox, Chad Maddox NO.:  [id number]   MEDICAL RECORD NO.:  [id number]          PATIENT TYPE:  INP   LOCATION:  4740                         FACILITY:  [facility]   PHYSICIAN:  Verne Carrow, MDDATE OF BIRTH:  1955-12-14   DATE OF PROCEDURE:  12/22/2008  DATE OF DISCHARGE:                            CARDIAC CATHETERIZATION   PRIMARY CARDIOLOGIST:  Maisie Fus C. Wall, MD, [facility]   PROCEDURES PERFORMED:  1. Left heart catheterization.  2. Selective coronary angiography.  3. Left ventricular angiogram.  4. Percutaneous coronary intervention with placement of a single drug-      eluting stent in the second obtuse marginal coronary artery.  5. Placement of an Angio-Seal femoral artery closure device.   OPERATOR:  Verne Carrow, MD   INDICATIONS:  Chest pain in a patient with known coronary artery disease  status post placement of a bare metal stent in the right coronary artery  in July 2009.   PROCEDURE IN DETAIL:  The patient was brought to the inpatient cardiac  catheterization laboratory after signing informed consent for the  procedure.  The right groin was prepped and draped in a sterile fashion.  Lidocaine 1% was used for local anesthesia.  A 5-French sheath was  inserted into the right femoral artery without difficulty.  Standard  diagnostic catheters were used to perform selective coronary  angiography.  A pigtail catheter was used to perform a left ventricular  angiogram.   At this point of the procedure, we elected to proceed to intervention of  the 99% stenosis of the second obtuse marginal coronary artery.  The  sheath was up-sized to a 6-French sheath.  The patient was given a bolus  of Angiomax and was started on a drip.  He was given an additional 300  mg of Plavix on the cath table.  The patient has been taking Plavix on a  daily basis for the last year.  An XB 3.5 guiding catheter was used to  selectively engage the left main coronary  artery.  Once the ACT was  greater than 200 a cougar intracoronary wire was passed down into the  circumflex artery and into the second obtuse marginal branch beyond the  area of tightest stenosis.  A 2.0 x 15-mm balloon was inflated in the  area of tightest stenosis.  A 2.5 x 23 mm Xience drug-eluting stent was  then deployed in the second obtuse marginal coronary artery.  A 2.5 x 20-  mm noncompliant balloon was then inflated inside the stent to 14  atmospheres.  This gave [place] a diameter of 2.55 mm.  There was an  excellent angiographic result.  The patient tolerated the procedure  well.  An Angio-Seal femoral artery closure device was placed in the  right femoral artery for hemostasis.  The patient was taken to the  holding area in stable condition.   HEMODYNAMIC FINDINGS:  Central aortic pressure 113/72, left ventricular  pressure 103/6, left ventricular end-diastolic pressure 12.   ANGIOGRAPHIC FINDINGS:  1. The left main coronary artery had no evidence of disease.  2. The left anterior descending coronary artery  is a large vessel that      courses to the apex and has diffuse disease.  There is a 50% lesion      in the proximal LAD and a 60% lesion in the mid LAD.  The distal      LAD has diffuse plaque of 20-30%.  The first diagonal is a large      bifurcating vessel that has plaque throughout its entire portion.      There is an inferior subbranch off of this vessel but has an ostial      95% stenosis.  This appears grossly unchanged since his last      catheterization.  3. The circumflex artery has an ostial 30% stenosis and a mid 30%      stenosis.  The first obtuse marginal is very small in caliber.  The      second obtuse marginal is a moderate-sized vessel that has a long      99% stenosis in the midportion of the vessel.  There are two small      sub branches that come off from the area of tightest stenosis in      the second obtuse marginal branch.  There was TIMI I flow  down the      most distal branch prior to the intervention.  4. The right coronary artery has a stent in the proximal segment that      shows minimal in-stent restenosis of 20%.  The mid and distal right      coronary artery have serial 30% lesions.  The posterior descending      artery is a small vessel that has an ostial 70% stenosis.  The      posterolateral segment is large and bifurcating with minimal plaque      disease only.  5. Left ventricular angiogram was performed in the RAO projection      shows normal left ventricular systolic function with no wall motion      abnormalities.  Ejection fraction is 65%.   IMPRESSION:  1. Triple-vessel coronary artery disease.  2. Patent stent in the proximal right coronary artery.  3. Severe stenosis of the second obtuse marginal branch.  4. Successful percutaneous coronary intervention with placement of a      Xience drug-eluting stent in the second obtuse marginal branch.  5. Normal left ventricular systolic function.   RECOMMENDATIONS:  I recommend continue aspirin and Plavix for at least  the next year.  The patient will also be continued on his statin and his  beta blocker.  He will be monitored closely in the telemetry unit  tonight and will be discharged home tomorrow if he is stable.      Verne Carrow, MD  Electronically Signed     CM/MEDQ  D:  12/22/2008  T:  12/23/2008  Job:  [id number]   cc:   Thomas C. Wall, MD, [facility]

## 2010-12-04 NOTE — Assessment & Plan Note (Signed)
[facility]                            CARDIOLOGY OFFICE NOTE   RAKEEN, GAILLARD                       MRN:          [id number]  DATE:01/28/2008                            DOB:          12-22-1955    Mr. Nies comes today for followup after being discharged from the  hospital with a presentation of unstable angina and elevated cardiac  markers.   He underwent cardiac catheterization, which showed 3-vessel disease with  a 50% proximal, 77% mid LAD, 90% sub-branch, 40% proximal, 70% distal  circumflex, and 95% proximal right coronary artery with a 30% distal  right coronary artery stenosis.  He had normal left ventricular systolic  function.  He underwent a bare metal stent placement to the right  coronary artery with success.   He was also found out to be a new diabetic with a hemoglobin A1c of  12.2.  He had a severe mixed hyperlipidemia with a total cholesterol of  306, triglycerides of 716, and HDL of 35.  He is grossly overweight as  well.  He had not been to a doctor in 30 years.   He has really developed a much more serious approach to his heath since  discharge.  He has not lost much weight yet.  However, his blood sugars  are running about 190.  He brings in a daily log.  In addition, he is  taking all of his medications.  Fortunately, he does not smoke.   He denies any angina or recurrent symptoms.  He fell like he had balloon  blew up inside and he could get a deep breath.   He is currently on aspirin 81 mg a day, Crestor 40 mg daily, metoprolol  25 mg p.o. b.i.d., Plavix 75 mg a day, lisinopril 20 mg a day, Glucotrol  XL 10 mg a day, Prilosec over the counter which he would like not to be  taking, and Plavix 75 mg a day for a year.   His review of systems otherwise are negative.   He looks remarkably better today than he did in the hospital.  His blood  pressure is 141/92.  His pulse is 77 and regular.  His weight is 266.  HEENT:  He has a long heavy beard.  PERRL.  Extraocular is intact.  Sclerae are clear.  Face symmetry is normal.  He is extremely pleasant.  Carotid upstrokes are equal bilaterally without bruits.  No JVD.  Thyroid is not enlarged.  Trachea is midline.  Lungs are clear.  Heart  reveals a poorly appreciated PMI.  There is normal S1 and S2.  Abdominal  exam is protuberant.  Good bowel sounds.  No midline bruit.  Extremities  reveal no edema.  Pulses are intact.  Neuro exam is intact.  Skin shows  no ecchymosis.   I have had a 20-minute plus discussion with Mr. Njie and his wife.  They seemed very serious about improving his health.   PLAN:  1. Continue current medications.  2. Continue weight reduction.  3. Continued blood sugar log.  4. Comprehensive metabolic panel, hemoglobin  A1c, and fasting lipids      in about 4 weeks.  5. I will have him return to see me in about 6 weeks.  6. We will probably have to uptitrate some of his medications      depending on his response to diet, sodium restriction and taking      his current meds.   All questions were answered.  In addition, EKG was obtained, which  showed no significant change and no acute changes.     Thomas C. Daleen Squibb, MD, [facility]  Electronically Signed    TCW/MedQ  DD: 01/28/2008  DT: 01/29/2008  Job #: [id number]

## 2010-12-04 NOTE — Assessment & Plan Note (Signed)
[facility]                            CARDIOLOGY OFFICE NOTE   DAK, SZUMSKI                       MRN:          [id number]  DATE:03/16/2008                            DOB:          10/31/1955    Mr. Graybeal comes in today for further management of the following  issues.  1. Multivessel coronary artery disease.  2. Status post bare-metal stent in the right coronary artery.  3. Sustained left ventricular function.  4. New onset diabetes.  5. Obesity.  6. Severe mixed hyperlipidemia.   He had a brain response to Crestor 40.  Specifically, his cholesterol  dropped from 306-152, his triglycerides went from 716-249, HDL is still  low at 32.4, his LDL cholesterol was 87.  Because of watching his diet  extremely closely, his hemoglobin A1c has dropped from 12.2-10.8.  His  LFTs were normal.  His chemistry profile was normal.  Otherwise his  blood sugar 192.   He is currently having no angina.  He has no orthopnea, PND, or  peripheral edema.  He is very frustrated.  His weight has remained  stable at 267.   PHYSICAL EXAMINATION:  His blood pressure is remarkably better today at  127/87, his pulse 74 and regular.   Dr. Yvonne Kendall had switched him from Crestor to simvastatin 80 to save him  some money.  He is having muscle aches in his hips and down his legs and  shins.   MEDICATIONS:  He is currently on,  1. Aspirin 81 mg a day.  2. Metoprolol 25 b.i.d.  3. Plavix 75 mg a day.  4. Lisinopril 20 mg a day.  5. Simvastatin 80 mg a day.  6. Glipizide 10 mg a day.   PHYSICAL EXAMINATION:  VITAL SIGNS:  His blood pressure today is 127/87,  his pulse is 74 and regular, his weight is 267.  HEENT:  Unchanged.  NECK:  Carotids are full without bruits.  No JVD.  Thyroid is not  enlarged.  Trachea is midline.  LUNGS:  Clear.  HEART:  Poorly appreciated PMI.  He has normal S1 and S2.  ABDOMEN:  Protuberant, good bowel sounds.  There is no  tenderness.  EXTREMITIES:  No edema.  Pulses are intact.  There is no muscle  tenderness.  NEURO:  Intact.   Mr. Wenker is doing remarkably well with therapeutic lifestyle  changes.  I have asked to continue with his diet though he is not losing  weight and is discouraged.  He does feel lot better in general.  Cause  of his myalgias, I will switch him from simvastatin back to Crestor 40  mg per day.  I have not made any other changes in his medications.  I  will plan on seeing him back in 3 months.     Thomas C. Daleen Squibb, MD, [facility]  Electronically Signed    TCW/MedQ  DD: 03/16/2008  DT: 03/17/2008  Job #: [id number]   cc:   Adelene Amas. Yvonne Kendall, MD

## 2011-01-22 ENCOUNTER — Telehealth: Payer: Self-pay | Admitting: *Deleted

## 2011-01-22 NOTE — Telephone Encounter (Signed)
Wife is aware Plavix is here. Mylo Red RN

## 2011-02-25 ENCOUNTER — Other Ambulatory Visit: Payer: Self-pay | Admitting: *Deleted

## 2011-04-18 LAB — CBC
HCT: 45.4
Hemoglobin: 15.6
Hemoglobin: 14.9
MCHC: 32.5
MCHC: 33.1
MCHC: 33.6
MCHC: 32.8
MCHC: 32.8
MCV: 77.6 — ABNORMAL LOW
MCV: 77.7 — ABNORMAL LOW
Platelets: 252
Platelets: 262
RBC: 5.8
RBC: 6.54 — ABNORMAL HIGH
RBC: 5.85 — ABNORMAL HIGH
RDW: 13.8
RDW: 14
RDW: 13.7
WBC: 16.5 — ABNORMAL HIGH

## 2011-04-18 LAB — POCT CARDIAC MARKERS
CKMB, poc: 2.6
CKMB, poc: 2.7
Myoglobin, poc: 44.5
Myoglobin, poc: 50.9
Troponin i, poc: <0.05

## 2011-04-18 LAB — CARDIAC PANEL(CRET KIN+CKTOT+MB+TROPI)
CK, MB: 4.6 — ABNORMAL HIGH
CK, MB: 4.6 — ABNORMAL HIGH
CK, MB: 5 — ABNORMAL HIGH
Relative Index: INVALID
Relative Index: INVALID
Relative Index: INVALID
Total CK: 78
Total CK: 78
Total CK: 86
Troponin I: 0.15 — ABNORMAL HIGH

## 2011-04-18 LAB — D-DIMER, QUANTITATIVE: D-Dimer, Quant: <0.22

## 2011-04-18 LAB — POCT I-STAT, CHEM 8
Glucose, Bld: 313 — ABNORMAL HIGH
HCT: 55 — ABNORMAL HIGH
Hemoglobin: 18.7 — ABNORMAL HIGH
Potassium: 4

## 2011-04-18 LAB — BASIC METABOLIC PANEL
CO2: 28
Chloride: 102
GFR calc Af Amer: >60
Glucose, Bld: 237 — ABNORMAL HIGH
Potassium: 4.2
Sodium: 137

## 2011-04-18 LAB — COMPREHENSIVE METABOLIC PANEL
ALT: 26
Albumin: 3.4 — ABNORMAL LOW
Calcium: 8.9
GFR calc Af Amer: >60
Glucose, Bld: 270 — ABNORMAL HIGH
Sodium: 136
Total Protein: 6

## 2011-04-18 LAB — PROTIME-INR
INR: 1
Prothrombin Time: 13.1

## 2011-04-18 LAB — DIFFERENTIAL
Basophils Relative: 0
Lymphocytes Relative: 13
Lymphs Abs: 2.2
Monocytes Relative: 4
Neutro Abs: 13.6 — ABNORMAL HIGH
Neutrophils Relative %: 82 — ABNORMAL HIGH

## 2011-04-18 LAB — URINALYSIS, ROUTINE W REFLEX MICROSCOPIC
Bilirubin Urine: NEGATIVE
Hgb urine dipstick: NEGATIVE
Protein, ur: NEGATIVE
Urobilinogen, UA: 0.2

## 2011-04-18 LAB — LIPID PANEL
HDL: 35 — ABNORMAL LOW
LDL Cholesterol: UNDETERMINED
Total CHOL/HDL Ratio: 8.7
Triglycerides: 716 — ABNORMAL HIGH
VLDL: UNDETERMINED

## 2011-04-18 LAB — CULTURE, BLOOD (ROUTINE X 2): Culture: NO GROWTH

## 2011-04-18 LAB — PHOSPHORUS: Phosphorus: 3.6

## 2011-04-18 LAB — HEPARIN LEVEL (UNFRACTIONATED): Heparin Unfractionated: 0.14 — ABNORMAL LOW

## 2011-04-18 LAB — MAGNESIUM: Magnesium: 2

## 2011-05-02 ENCOUNTER — Telehealth: Payer: Self-pay | Admitting: Cardiology

## 2011-05-02 NOTE — Telephone Encounter (Signed)
Please refill metoprolol tart 25 mg to [facility] in [employer] [place] [phone] That is where he is at working

## 2011-05-03 ENCOUNTER — Other Ambulatory Visit: Payer: Self-pay | Admitting: Cardiology

## 2011-05-06 ENCOUNTER — Telehealth: Payer: Self-pay | Admitting: Cardiology

## 2011-05-06 ENCOUNTER — Other Ambulatory Visit: Payer: Self-pay | Admitting: *Deleted

## 2011-05-06 MED ORDER — LISINOPRIL 20 MG PO TABS: 20.0000 mg | ORAL_TABLET | Freq: Every day | ORAL | Status: DC

## 2011-05-06 MED ORDER — METOPROLOL TARTRATE 25 MG PO TABS: 25.0000 mg | ORAL_TABLET | Freq: Two times a day (BID) | ORAL | Status: DC

## 2011-05-06 NOTE — Telephone Encounter (Signed)
Requesting refill of lisinopril, [facility]

## 2011-07-11 ENCOUNTER — Encounter: Payer: Self-pay | Admitting: *Deleted

## 2011-07-12 ENCOUNTER — Ambulatory Visit (INDEPENDENT_AMBULATORY_CARE_PROVIDER_SITE_OTHER): Payer: Self-pay | Admitting: Cardiology

## 2011-07-12 ENCOUNTER — Other Ambulatory Visit: Payer: Self-pay | Admitting: *Deleted

## 2011-07-12 ENCOUNTER — Encounter: Payer: Self-pay | Admitting: Cardiology

## 2011-07-12 VITALS — BP 118/86 | HR 74 | Ht 70.0 in | Wt 266.0 lb

## 2011-07-12 DIAGNOSIS — E119 Type 2 diabetes mellitus without complications: Secondary | ICD-10-CM

## 2011-07-12 DIAGNOSIS — I10 Essential (primary) hypertension: Secondary | ICD-10-CM

## 2011-07-12 DIAGNOSIS — E669 Obesity, unspecified: Secondary | ICD-10-CM

## 2011-07-12 DIAGNOSIS — E785 Hyperlipidemia, unspecified: Secondary | ICD-10-CM

## 2011-07-12 DIAGNOSIS — I2510 Atherosclerotic heart disease of native coronary artery without angina pectoris: Secondary | ICD-10-CM

## 2011-07-12 MED ORDER — ROSUVASTATIN CALCIUM 40 MG PO TABS: 40.0000 mg | ORAL_TABLET | Freq: Every day | ORAL | Status: DC

## 2011-07-12 MED ORDER — CLOPIDOGREL BISULFATE 75 MG PO TABS: 75.0000 mg | ORAL_TABLET | Freq: Every day | ORAL | Status: DC

## 2011-07-12 NOTE — Patient Instructions (Signed)
Your physician recommends that you schedule a follow-up appointment in: 1 year  

## 2011-07-12 NOTE — Assessment & Plan Note (Signed)
Stable. No change in treatment. Get hemoglobin A1c below 7%. See [place] back in the office in a year.

## 2011-07-12 NOTE — Progress Notes (Signed)
HPI Mr. Becvar comes in today for his annual evaluation and management of his coronary disease. In working manually in [place] on here. His had no angina or ischemic symptoms. He is compliant with his medications. His primary care physician checks his blood work once a year. His hemoglobin A1c is and then above 7%. He is very sensitive to any kind of natural sugar including fruit. He avoids complex carbs.  Past Medical History  Diagnosis Date  . Coronary artery disease     s/p NSTEMI 01/2008  -- s/p BMS to RCA 01/2008  --  s/p XIENCE DES TO OM2 12/2008  --  OTW NONOBS DZS AND PATENT RCA STENT, EF 60%   . Hypertension   . Hyperlipidemia, mixed   . Diabetes mellitus   . Obesity   . GERD (gastroesophageal reflux disease)     Current Outpatient Prescriptions  Medication Sig Dispense Refill  . aspirin 81 MG tablet Take 81 mg by mouth daily.        . clopidogrel (PLAVIX) 75 MG tablet Take 1 tablet (75 mg total) by mouth daily.  30 tablet  11  . glipiZIDE (GLUCOTROL XL) 10 MG 24 hr tablet Take 10 mg by mouth daily.        [name] lisinopril (PRINIVIL,ZESTRIL) 20 MG tablet Take 1 tablet (20 mg total) by mouth daily.  90 tablet  3  . metFORMIN (GLUCOPHAGE-XR) 500 MG 24 hr tablet Take 1,000 mg by mouth 2 (two) times daily.        . metoprolol tartrate (LOPRESSOR) 25 MG tablet Take 1 tablet (25 mg total) by mouth 2 (two) times daily.  180 tablet  3  . nitroGLYCERIN (NITROSTAT) 0.4 MG SL tablet Place 0.4 mg under the tongue every 5 (five) minutes as needed.        . rosuvastatin (CRESTOR) 40 MG tablet Take 40 mg by mouth daily.          Allergies  Allergen Reactions  . Penicillins     Family History  Problem Relation Age of Onset  . Cerebral aneurysm Mother   . Diabetes Mother   . COPD Father   . Bone cancer Father   . Lung cancer Father   . Lymphoma Sister     History   Social History  . Marital Status: Married    Spouse Name: N/A    Number of Children: N/A  . Years of Education: N/A    Occupational History  . Not on file.   Social History Main Topics  . Smoking status: Never Smoker   . Smokeless tobacco: Not on file  . Alcohol Use: No  . Drug Use: No  . Sexually Active: Not on file   Other Topics Concern  . Not on file   Social History Narrative  . No narrative on file    ROS ALL NEGATIVE EXCEPT THOSE NOTED IN HPI  PE  General Appearance: well developed, well nourished in no acute distress, overweight HEENT: symmetrical face, PERRLA, good dentition  Neck: no JVD, thyromegaly, or adenopathy, trachea midline Chest: symmetric without deformity Cardiac: PMI non-displaced, RRR, Soft S.1, S2, no gallop or murmur Lung: clear to ausculation and percussion Vascular: all pulses full without bruits  Abdominal: nondistended, nontender, good bowel sounds, no HSM, no bruits Extremities: no cyanosis, clubbing or edema, no sign of DVT, no varicosities  Skin: normal color, no rashes Neuro: alert and oriented x 3, non-focal Pysch: normal affect  EKG Normal sinus rhythm, normal EKG BMET  Component Value Date/Time   NA 138 03/29/2009 0931   K 5.1 03/29/2009 0931   CL 100 03/29/2009 0931   CO2 30 03/29/2009 0931   GLUCOSE 225* 03/29/2009 0931   BUN 16 03/29/2009 0931   CREATININE 1.0 03/29/2009 0931   CALCIUM 9.7 03/29/2009 0931   GFRNONAA 83.29 03/29/2009 0931   GFRAA  Value: >60        The eGFR has been calculated using the MDRD equation. This calculation has not been validated in all clinical situations. eGFR's persistently <60 mL/min signify possible Chronic Kidney Disease. 12/23/2008 0604    Lipid Panel     Component Value Date/Time   CHOL  Value: 158        ATP III CLASSIFICATION:  <200     mg/dL   Desirable  981-191  mg/dL   Borderline High  >=478    mg/dL   High        08/31/5619 0604   TRIG 265* 12/23/2008 0604   HDL 29* 12/23/2008 0604   CHOLHDL 5.4 12/23/2008 0604   VLDL 53* 12/23/2008 0604   LDLCALC  Value: 76        Total Cholesterol/HDL:CHD Risk Coronary Heart Disease  Risk Table                     Men   Women  1/2 Average Risk   3.4   3.3  Average Risk       5.0   4.4  2 X Average Risk   9.6   7.1  3 X Average Risk  23.4   11.0        Use the calculated Patient Ratio above and the CHD Risk Table to determine the patient's CHD Risk.        ATP III CLASSIFICATION (LDL):  <100     mg/dL   Optimal  308-657  mg/dL   Near or Above                    Optimal  130-159  mg/dL   Borderline  846-962  mg/dL   High  >952     mg/dL   Very High 02/23/1323 4010    CBC    Component Value Date/Time   WBC 13.9* 12/23/2008 0604   RBC 6.09* 12/23/2008 0604   HGB 15.7 12/23/2008 0604   HCT 48.9 12/23/2008 0604   PLT 216 12/23/2008 0604   MCV 80.2 12/23/2008 0604   MCHC 32.1 12/23/2008 0604   RDW 14.6 12/23/2008 0604   LYMPHSABS 2.2 01/18/2008 1315   MONOABS 0.7 01/18/2008 1315   EOSABS 0.0 01/18/2008 1315   BASOSABS 0.0 01/18/2008 1315

## 2011-07-19 ENCOUNTER — Other Ambulatory Visit: Payer: Self-pay | Admitting: Cardiology

## 2011-07-19 MED ORDER — ROSUVASTATIN CALCIUM 40 MG PO TABS: 40.0000 mg | ORAL_TABLET | Freq: Every day | ORAL | Status: DC

## 2011-07-19 NOTE — Telephone Encounter (Signed)
I will forward this message to [facility] LPN who is doing follow-up for Dr Daleen Squibb.

## 2011-07-19 NOTE — Telephone Encounter (Signed)
New problem:  Status of paperwork for assistance on crestor.

## 2011-07-19 NOTE — Telephone Encounter (Signed)
Paperwork completed and sent in with new Rx.

## 2012-04-15 ENCOUNTER — Other Ambulatory Visit: Payer: Self-pay | Admitting: Cardiology

## 2012-04-15 MED ORDER — CLOPIDOGREL BISULFATE 75 MG PO TABS: 75.0000 mg | ORAL_TABLET | Freq: Every day | ORAL | Status: DC

## 2012-04-16 MED ORDER — CLOPIDOGREL BISULFATE 75 MG PO TABS: 75.0000 mg | ORAL_TABLET | Freq: Every day | ORAL | Status: DC

## 2012-06-24 ENCOUNTER — Ambulatory Visit: Payer: Self-pay | Admitting: Cardiology

## 2012-07-06 ENCOUNTER — Other Ambulatory Visit: Payer: Self-pay | Admitting: Cardiology

## 2012-07-06 MED ORDER — METOPROLOL TARTRATE 25 MG PO TABS: 25.0000 mg | ORAL_TABLET | Freq: Two times a day (BID) | ORAL | Status: DC

## 2012-07-06 MED ORDER — LISINOPRIL 20 MG PO TABS: 20.0000 mg | ORAL_TABLET | Freq: Every day | ORAL | Status: DC

## 2012-07-21 ENCOUNTER — Telehealth: Payer: Self-pay | Admitting: Cardiology

## 2012-07-21 NOTE — Telephone Encounter (Signed)
Pt needs reorder of crestor with the patient assistance program fax [phone] pt id# [phone]

## 2012-08-07 ENCOUNTER — Encounter: Payer: Self-pay | Admitting: Cardiology

## 2012-08-13 MED ORDER — ROSUVASTATIN CALCIUM 40 MG PO TABS: 40.0000 mg | ORAL_TABLET | Freq: Every day | ORAL | Status: DC

## 2012-08-13 NOTE — Telephone Encounter (Signed)
I spoke with [employer] representative and new order for Crestor faxed to them Pt has already reapplied LMOVM does pt need samples? Mylo Red RN

## 2012-08-25 ENCOUNTER — Ambulatory Visit: Payer: Self-pay | Admitting: Cardiology

## 2012-09-09 ENCOUNTER — Telehealth: Payer: Self-pay | Admitting: Cardiology

## 2012-09-29 ENCOUNTER — Telehealth: Payer: Self-pay | Admitting: *Deleted

## 2012-09-29 NOTE — Telephone Encounter (Signed)
Wife is aware [employer] forms are completed and will be faxed in today Mylo Red RN

## 2012-10-05 ENCOUNTER — Other Ambulatory Visit: Payer: Self-pay | Admitting: Cardiology

## 2012-10-08 ENCOUNTER — Ambulatory Visit (INDEPENDENT_AMBULATORY_CARE_PROVIDER_SITE_OTHER): Payer: Self-pay | Admitting: Cardiology

## 2012-10-08 ENCOUNTER — Encounter: Payer: Self-pay | Admitting: Cardiology

## 2012-10-08 VITALS — BP 120/80 | HR 81 | Ht 69.0 in | Wt 261.4 lb

## 2012-10-08 DIAGNOSIS — I10 Essential (primary) hypertension: Secondary | ICD-10-CM

## 2012-10-08 DIAGNOSIS — E669 Obesity, unspecified: Secondary | ICD-10-CM

## 2012-10-08 DIAGNOSIS — I2510 Atherosclerotic heart disease of native coronary artery without angina pectoris: Secondary | ICD-10-CM

## 2012-10-08 DIAGNOSIS — E119 Type 2 diabetes mellitus without complications: Secondary | ICD-10-CM

## 2012-10-08 DIAGNOSIS — E785 Hyperlipidemia, unspecified: Secondary | ICD-10-CM

## 2012-10-08 MED ORDER — LISINOPRIL 20 MG PO TABS: 20.0000 mg | ORAL_TABLET | Freq: Every day | ORAL | Status: DC

## 2012-10-08 NOTE — Patient Instructions (Addendum)
Your physician discussed the importance of regular exercise and recommended that you start or continue a regular exercise program for good health.  Your physician encouraged you to lose weight for better health.  Your physician recommends that you continue on your current medications as directed. Please refer to the Current Medication list given to you today.  Your physician wants you to follow-up in: 1 year with Dr. Daleen Squibb. You will receive a reminder letter in the mail two months in advance. If you don't receive a letter, please call our office to schedule the follow-up appointment.  Decrease your sweets, and white carbohydrates such as: pastas, rice, and potatoes.   Basic Carbohydrate Counting Basic carbohydrate counting is a way to plan meals. It is done by counting the amount of carbohydrate in foods. Foods that have carbohydrates are starches (grains, beans, starchy vegetables) and sweets. Eating carbohydrates increases blood glucose (sugar) levels. People with diabetes use carbohydrate counting to help keep their blood glucose at a normal level.  COUNTING CARBOHYDRATES IN FOODS The first step in counting carbohydrates is to learn how many carbohydrate servings you should have in every meal. A dietitian can plan this for you. After learning the amount of carbohydrates to include in your meal plan, you can start to choose the carbohydrate-containing foods you want to eat.  There are 2 ways to identify the amount of carbohydrates in the foods you eat.  Read the Nutrition Facts panel on food labels. You need 2 pieces of information from the Nutrition Facts panel to count carbohydrates this way:  Serving size.  Total carbohydrate (in grams). Decide how many servings you will be eating. If it is 1 serving, you will be eating the amount of carbohydrate listed on the panel. If you will be eating 2 servings, you will be eating double the amount of carbohydrate listed on the panel.   Learn serving  sizes. A serving size of most carbohydrate-containing foods is about 15 grams (g). Listed below are single serving sizes of common carbohydrate-containing foods:  1 slice bread.   cup unsweetened, dry cereal.   cup hot cereal.   cup rice.   cup mashed potatoes.   cup pasta.  1 cup fresh fruit.   cup canned fruit.  1 cup milk (whole, 2%, or skim).   cup starchy vegetables (peas, corn, or potatoes). Counting carbohydrates this way is similar to looking on the Nutrition Facts panel. Decide how many servings you will eat first. Multiply the number of servings you eat by 15 g. For example, if you have 2 cups of strawberries, you had 2 servings. That means you had 30 g of carbohydrate (2 servings x 15 g = 30 g). CALCULATING CARBOHYDRATES IN A MEAL Sample dinner  3 oz chicken breast.   cup brown rice.   cup corn.  1 cup fat-free milk.  1 cup strawberries with sugar-free whipped topping. Carbohydrate calculation First, identify the foods that contain carbohydrate:  Rice.  Corn.  Milk.  Strawberries. Calculate the number of servings eaten:  2 servings rice.  1 serving corn.  1 serving milk.  1 serving strawberries. Multiply the number of servings by 15 g:  2 servings rice x 15 g = 30 g.  1 serving corn x 15 g = 15 g.  1 serving milk x 15 g = 15 g.  1 serving strawberries x 15 g = 15 g. Add the amounts to find the total carbohydrates eaten: 30 g + 15 g + 15  g + 15 g = 75 g carbohydrate eaten at dinner. Document Released: 07/08/2005 Document Revised: 09/30/2011 Document Reviewed: 05/24/2011 [facility] Patient Information 2013 [place], [place].

## 2012-10-08 NOTE — Progress Notes (Signed)
HPI [name] returns today for evaluation and management his coronary artery disease. He has been working [place] [employer] [place]. Unfortunately he's been very sedentary and has gained 20 pounds.  His hemoglobin A1c runs about 8%. He talks a good game but I'm not sure he is compliant with his diet.  His labs are followed by primary care with Dr Yvonne Kendall.  He denies any angina or chest pain. Denies any claudication. He seems to be compliant with his meds.  Past Medical History  Diagnosis Date  . Coronary artery disease     s/p NSTEMI 01/2008  -- s/p BMS to RCA 01/2008  --  s/p XIENCE DES TO OM2 12/2008  --  OTW NONOBS DZS AND PATENT RCA STENT, EF 60%   . Hypertension   . Hyperlipidemia, mixed   . Diabetes mellitus   . Obesity   . GERD (gastroesophageal reflux disease)     Current Outpatient Prescriptions  Medication Sig Dispense Refill  . aspirin 81 MG tablet Take 81 mg by mouth daily.        . clopidogrel (PLAVIX) 75 MG tablet Take 1 tablet (75 mg total) by mouth daily.  30 tablet  5  . glipiZIDE (GLUCOTROL XL) 10 MG 24 hr tablet Take 10 mg by mouth daily.        [name] lisinopril (PRINIVIL,ZESTRIL) 20 MG tablet TAKE ONE TABLET BY MOUTH EVERY DAY  30 tablet  0  . metFORMIN (GLUCOPHAGE-XR) 500 MG 24 hr tablet Take 1,000 mg by mouth 2 (two) times daily.        . metoprolol tartrate (LOPRESSOR) 25 MG tablet Take 1 tablet (25 mg total) by mouth 2 (two) times daily.  180 tablet  0  . nitroGLYCERIN (NITROSTAT) 0.4 MG SL tablet Place 0.4 mg under the tongue every 5 (five) minutes as needed.        . rosuvastatin (CRESTOR) 40 MG tablet Take 1 tablet (40 mg total) by mouth daily.  90 tablet  3   No current facility-administered medications for this visit.    Allergies  Allergen Reactions  . Penicillins     Family History  Problem Relation Age of Onset  . Cerebral aneurysm Mother   . Diabetes Mother   . COPD Father   . Bone cancer Father   . Lung cancer Father   . Lymphoma  Sister     History   Social History  . Marital Status: Married    Spouse Name: N/A    Number of Children: N/A  . Years of Education: N/A   Occupational History  . Not on file.   Social History Main Topics  . Smoking status: Never Smoker   . Smokeless tobacco: Not on file  . Alcohol Use: No  . Drug Use: No  . Sexually Active: Not on file   Other Topics Concern  . Not on file   Social History Narrative  . No narrative on file    ROS ALL NEGATIVE EXCEPT THOSE NOTED IN HPI  PE  General Appearance: well developed, well nourished in no acute distress, overweight HEENT: symmetrical face, PERRLA, good dentition  Neck: no JVD, thyromegaly, or adenopathy, trachea midline Chest: symmetric without deformity Cardiac: PMI non-displaced, RRR, normal S1, S2, no gallop or murmur Lung: clear to ausculation and percussion Vascular: all pulses full without bruits  Abdominal: nondistended, nontender, good bowel sounds, no HSM, no bruits Extremities: no cyanosis, clubbing or edema, no sign of DVT, no varicosities  Skin: normal color, no rashes Neuro: alert and oriented x 3, non-focal Pysch: normal affect  EKG Normal sinus rhythm, mild earlier polarization.  BMET    Component Value Date/Time   NA 138 03/29/2009 0931   K 5.1 03/29/2009 0931   CL 100 03/29/2009 0931   CO2 30 03/29/2009 0931   GLUCOSE 225* 03/29/2009 0931   BUN 16 03/29/2009 0931   CREATININE 1.0 03/29/2009 0931   CALCIUM 9.7 03/29/2009 0931   GFRNONAA 83.29 03/29/2009 0931   GFRAA  Value: >60        The eGFR has been calculated using the MDRD equation. This calculation has not been validated in all clinical situations. eGFR's persistently <60 mL/min signify possible Chronic Kidney Disease. 12/23/2008 0604    Lipid Panel     Component Value Date/Time   CHOL  Value: 158        ATP III CLASSIFICATION:  <200     mg/dL   Desirable  161-096  mg/dL   Borderline High  >=045    mg/dL   High        4/0/9811 0604   TRIG 265* 12/23/2008 0604    HDL 29* 12/23/2008 0604   CHOLHDL 5.4 12/23/2008 0604   VLDL 53* 12/23/2008 0604   LDLCALC  Value: 76        Total Cholesterol/HDL:CHD Risk Coronary Heart Disease Risk Table                     Men   Women  1/2 Average Risk   3.4   3.3  Average Risk       5.0   4.4  2 X Average Risk   9.6   7.1  3 X Average Risk  23.4   11.0        Use the calculated Patient Ratio above and the CHD Risk Table to determine the patient's CHD Risk.        ATP III CLASSIFICATION (LDL):  <100     mg/dL   Optimal  914-782  mg/dL   Near or Above                    Optimal  130-159  mg/dL   Borderline  956-213  mg/dL   High  >086     mg/dL   Very High 11/26/8467 6295    CBC    Component Value Date/Time   WBC 13.9* 12/23/2008 0604   RBC 6.09* 12/23/2008 0604   HGB 15.7 12/23/2008 0604   HCT 48.9 12/23/2008 0604   PLT 216 12/23/2008 0604   MCV 80.2 12/23/2008 0604   MCHC 32.1 12/23/2008 0604   RDW 14.6 12/23/2008 0604   LYMPHSABS 2.2 01/18/2008 1315   MONOABS 0.7 01/18/2008 1315   EOSABS 0.0 01/18/2008 1315   BASOSABS 0.0 01/18/2008 1315

## 2012-10-08 NOTE — Assessment & Plan Note (Signed)
Stable. Continue secondary preventative therapy. I have urged him to lose weight and get his hemoglobin A1c below 7%. No change in medications. Return the office in a year.

## 2013-01-05 ENCOUNTER — Other Ambulatory Visit: Payer: Self-pay | Admitting: Cardiology

## 2013-04-27 ENCOUNTER — Other Ambulatory Visit: Payer: Self-pay | Admitting: Cardiology

## 2013-04-27 ENCOUNTER — Other Ambulatory Visit: Payer: Self-pay | Admitting: *Deleted

## 2013-04-27 MED ORDER — CLOPIDOGREL BISULFATE 75 MG PO TABS: ORAL_TABLET | ORAL | Status: DC

## 2013-10-19 ENCOUNTER — Encounter: Payer: Self-pay | Admitting: Cardiology

## 2013-10-19 ENCOUNTER — Ambulatory Visit (INDEPENDENT_AMBULATORY_CARE_PROVIDER_SITE_OTHER): Payer: Self-pay | Admitting: Cardiology

## 2013-10-19 VITALS — BP 126/83 | HR 78 | Ht 69.0 in | Wt 261.4 lb

## 2013-10-19 DIAGNOSIS — E785 Hyperlipidemia, unspecified: Secondary | ICD-10-CM

## 2013-10-19 DIAGNOSIS — I10 Essential (primary) hypertension: Secondary | ICD-10-CM

## 2013-10-19 DIAGNOSIS — I2510 Atherosclerotic heart disease of native coronary artery without angina pectoris: Secondary | ICD-10-CM

## 2013-10-19 DIAGNOSIS — E669 Obesity, unspecified: Secondary | ICD-10-CM

## 2013-10-19 MED ORDER — METOPROLOL TARTRATE 25 MG PO TABS: 25.0000 mg | ORAL_TABLET | Freq: Two times a day (BID) | ORAL | Status: DC

## 2013-10-19 MED ORDER — ROSUVASTATIN CALCIUM 40 MG PO TABS: 40.0000 mg | ORAL_TABLET | Freq: Every day | ORAL | Status: DC

## 2013-10-19 MED ORDER — CLOPIDOGREL BISULFATE 75 MG PO TABS: ORAL_TABLET | ORAL | Status: DC

## 2013-10-19 MED ORDER — LISINOPRIL 20 MG PO TABS: 20.0000 mg | ORAL_TABLET | Freq: Every day | ORAL | Status: DC

## 2013-10-19 NOTE — Progress Notes (Signed)
Chad Maddox Date of Birth: 12/01/1955 Medical Record #[id number]  History of Present Illness: [name] is seen to establish cardiac care. He is a former patient of Dr. Daleen SquibbWall. He has a history of CAD s/p NSTEMI in 2009 with BMS of the RCA. In 2010 he had stenting of the OM2 with a DES. He reports he has done very well since then without recurrent chest pain or SOB. He is concerned about his inactivity. His job requires him to sit for 12 hours/day. His walking is limited by hip pain from an old rodeo accident. His labs are followed by his primary care in [place], Dr. Dewayne ShorterJames Foushee. He also has a history of DM type 2, HTN, and hyperlipidemia. He reports good control of BP and lipids.   Current Outpatient Prescriptions on File Prior to Visit  Medication Sig Dispense Refill  . aspirin 81 MG tablet Take 81 mg by mouth daily.        [name]. glipiZIDE (GLUCOTROL XL) 10 MG 24 hr tablet Take 10 mg by mouth daily.        . metFORMIN (GLUCOPHAGE-XR) 500 MG 24 hr tablet Take 1,000 mg by mouth 2 (two) times daily.        . nitroGLYCERIN (NITROSTAT) 0.4 MG SL tablet Place 0.4 mg under the tongue every 5 (five) minutes as needed.         No current facility-administered medications on file prior to visit.    Allergies  Allergen Reactions  . Penicillins     Past Medical History  Diagnosis Date  . Coronary artery disease     s/p NSTEMI 01/2008  -- s/p BMS to RCA 01/2008  --  s/p XIENCE DES TO OM2 12/2008  --  OTW NONOBS DZS AND PATENT RCA STENT, EF 60%   . Hypertension   . Hyperlipidemia, mixed   . Diabetes mellitus   . Obesity   . GERD (gastroesophageal reflux disease)     Past Surgical History  Procedure Laterality Date  . Cardiac catheterization  12/22/2008     Triple-vessel coronary artery disease -- Patent stent in the proximal right coronary artery -- Severe stenosis of the second obtuse marginal branch -- Successful percutaneous coronary intervention with placement of a Xience  drug-eluting stent in the second obtuse marginal branch.  -- Normal left ventricular systolic function    History  Smoking status  . Never Smoker   Smokeless tobacco  . Not on file    History  Alcohol Use No    Family History  Problem Relation Age of Onset  . Cerebral aneurysm Mother   . Diabetes Mother   . COPD Father   . Bone cancer Father   . Lung cancer Father   . Lymphoma Sister     Review of Systems: The review of systems is positive for chronic right hip pain.  All other systems were reviewed and are negative.  Physical Exam: BP 126/83  Pulse 78  Ht 5\' 9"  (1.753 m)  Wt 261 lb 6.4 oz (118.57 kg)  BMI 38.58 kg/m2 He is a pleasant obese WM in NAD.  HEENT: [place]/AT, PERRLA, EOMI, sclera are clear. Oropharynx is clear. He has a heavy beard. Neck: no JVD, adenopathy, thyromegaly, or bruits. Lungs: clear. CV: RRR, normal S1-2, no gallop, rub, click. No murmur. PMI is normal. Abd: obese soft , NT, BS+, no masses or HSM. Ext: no cyanosis or edema. Pulses 2+ Skin: warm and dry Neuro: alert and oriented x 3, CN  II-XII intact.  LABORATORY DATA: Ecg: NSR rate 78, Mild diffuse ST elevation-chronic.  Assessment / Plan: 1. CAD s/p BMS of RCA 2009, s/p DES OM2 2010. Asymptomatic. Continue risk factor modification. Needs to increase aerobic activity and lose weight. I will follow up in 1 year.  2. HTN- controlled.  3. DM type 2  4. Hyperlipidemia. Will request last lab results from Dr. Yvonne Kendall.  Prescriptions refilled today.

## 2013-10-19 NOTE — Patient Instructions (Signed)
Continue your current therapy  You need to focus on increasing your aerobic activity and lose weight.  I will see you in one year.

## 2013-10-20 ENCOUNTER — Telehealth: Payer: Self-pay | Admitting: Cardiology

## 2013-10-20 NOTE — Telephone Encounter (Signed)
Returned call to patient's wife she will check with husband on best time for him to come to office for fasting lab work.Stated she will call me back later today.

## 2013-10-20 NOTE — Telephone Encounter (Signed)
New message  ° ° °Returning Cheryls call  °

## 2013-11-01 ENCOUNTER — Other Ambulatory Visit: Payer: Self-pay

## 2013-11-01 DIAGNOSIS — E785 Hyperlipidemia, unspecified: Secondary | ICD-10-CM

## 2013-11-01 DIAGNOSIS — I10 Essential (primary) hypertension: Secondary | ICD-10-CM

## 2013-11-01 DIAGNOSIS — I2510 Atherosclerotic heart disease of native coronary artery without angina pectoris: Secondary | ICD-10-CM

## 2013-11-01 NOTE — Telephone Encounter (Signed)
Patient called spoke to wife Dr.Jordan received lab work from Dr.Fooshee,only lab Hgba1c 2012.Dr.Jordan advised needs fasting lipid,hepaticpanels,bmet.Wife stated husband out of town travels a lot.Wife stated she will have husband call me back to schedule fasting lab appointment.

## 2014-02-23 ENCOUNTER — Telehealth: Payer: Self-pay | Admitting: Cardiology

## 2014-02-23 NOTE — Telephone Encounter (Signed)
Walk In pt Form " [place] & [place]" paper Dropped off this was Sent interoffice to Chery/Dr/Jordan [facility]  8.5.15/km

## 2014-03-23 ENCOUNTER — Telehealth: Payer: Self-pay | Admitting: *Deleted

## 2014-03-23 ENCOUNTER — Telehealth: Payer: Self-pay | Admitting: Cardiology

## 2014-03-23 NOTE — Telephone Encounter (Signed)
Patients wife called and she stated that she just spoke with an [employer] representative on the phone and they stated that they never received the patient assistance forms for crestor. Please advise. Thanks, MI

## 2014-03-23 NOTE — Telephone Encounter (Signed)
Mrs.Paulhus is calling to follow up on an application for her husband Crestor thru [employer] and has not heard anything as of yet . The application was at the  [facility] , but Angelica Chessman states that she sent it to you on 02/23/14. Pleae cll her at Home -[phone] after 1:50pm she says call her cell phone which is [phone]..  Thanks

## 2014-03-23 NOTE — Telephone Encounter (Signed)
See previous 03/23/14 note.

## 2014-03-23 NOTE — Telephone Encounter (Signed)
Returned call to patient's wife [employer] patient assistant form for crestor refaxed to fax # [phone].

## 2014-03-30 ENCOUNTER — Telehealth: Payer: Self-pay

## 2014-03-30 NOTE — Telephone Encounter (Signed)
Received call from patient's wife.She stated [employer] needed a new prescription for crestor 40 mg faxed to them.Crestor 40 mg daily # 90 refills x 3 faxed to [employer] at fax # [phone].

## 2014-04-01 ENCOUNTER — Telehealth: Payer: Self-pay | Admitting: Cardiology

## 2014-04-01 NOTE — Telephone Encounter (Signed)
Returned call to patient's wife Music therapist paper work re faxed to [phone].

## 2014-04-01 NOTE — Telephone Encounter (Signed)
Pt's wife called in stating that [employer] will be faxed over a form for Chad Maddox to fill out and Ms. Bridge was told that the doctor HAS to sign off on the form , a stamp is not acceptable. Please call  Thanks

## 2014-04-06 NOTE — Telephone Encounter (Signed)
Received call from patient's wife she stated [employer] received paper work and medication being shipped.

## 2014-07-18 ENCOUNTER — Other Ambulatory Visit: Payer: Self-pay | Admitting: *Deleted

## 2014-07-18 MED ORDER — CLOPIDOGREL BISULFATE 75 MG PO TABS: ORAL_TABLET | ORAL | Status: DC

## 2014-10-13 ENCOUNTER — Ambulatory Visit: Payer: Self-pay | Admitting: Cardiology

## 2014-10-20 ENCOUNTER — Ambulatory Visit: Payer: Self-pay | Admitting: Cardiology

## 2014-12-29 ENCOUNTER — Other Ambulatory Visit: Payer: Self-pay | Admitting: *Deleted

## 2014-12-29 MED ORDER — METOPROLOL TARTRATE 25 MG PO TABS: 25.0000 mg | ORAL_TABLET | Freq: Two times a day (BID) | ORAL | Status: DC

## 2015-01-10 ENCOUNTER — Other Ambulatory Visit: Payer: Self-pay | Admitting: Cardiology

## 2015-01-10 MED ORDER — LISINOPRIL 20 MG PO TABS: 20.0000 mg | ORAL_TABLET | Freq: Every day | ORAL | Status: DC

## 2015-01-27 ENCOUNTER — Encounter: Payer: Self-pay | Admitting: Cardiology

## 2015-01-27 ENCOUNTER — Ambulatory Visit (INDEPENDENT_AMBULATORY_CARE_PROVIDER_SITE_OTHER): Payer: Self-pay | Admitting: Cardiology

## 2015-01-27 VITALS — BP 110/80 | HR 72 | Ht 70.0 in | Wt 237.7 lb

## 2015-01-27 DIAGNOSIS — E119 Type 2 diabetes mellitus without complications: Secondary | ICD-10-CM

## 2015-01-27 DIAGNOSIS — I2510 Atherosclerotic heart disease of native coronary artery without angina pectoris: Secondary | ICD-10-CM

## 2015-01-27 DIAGNOSIS — I10 Essential (primary) hypertension: Secondary | ICD-10-CM

## 2015-01-27 DIAGNOSIS — E669 Obesity, unspecified: Secondary | ICD-10-CM

## 2015-01-27 DIAGNOSIS — E1169 Type 2 diabetes mellitus with other specified complication: Secondary | ICD-10-CM

## 2015-01-27 MED ORDER — ROSUVASTATIN CALCIUM 40 MG PO TABS: 40.0000 mg | ORAL_TABLET | Freq: Every day | ORAL | Status: DC

## 2015-01-27 NOTE — Progress Notes (Signed)
Chad Maddox Date of Birth: 05-29-1956 Medical Record #[id number]  History of Present Illness: [name] is seen for follow up CAD. He has a history of CAD s/p NSTEMI in 2009 with BMS of the RCA. In 2010 he had stenting of the OM2 with a DES. He reports he has done very well since then without recurrent chest pain or SOB.  His labs are followed by his primary care in [place], Dr. Dewayne Shorter. He also has a history of DM type 2, HTN, and hyperlipidemia. He reports his A1c was over 10. He was started on Invokana. Since then he states he has felt great. He has lost 11 lbs. He is sleeping better and his aches have resolved. No complaints today.  Current Outpatient Prescriptions on File Prior to Visit  Medication Sig Dispense Refill  . aspirin 81 MG tablet Take 81 mg by mouth daily.      . clopidogrel (PLAVIX) 75 MG tablet TAKE ONE TABLET BY MOUTH EVERY DAY 30 tablet 5  . glipiZIDE (GLUCOTROL XL) 10 MG 24 hr tablet Take 10 mg by mouth daily.      [name] lisinopril (PRINIVIL,ZESTRIL) 20 MG tablet Take 1 tablet (20 mg total) by mouth daily. 90 tablet 0  . metFORMIN (GLUCOPHAGE-XR) 500 MG 24 hr tablet Take 1,000 mg by mouth 2 (two) times daily.      . metoprolol tartrate (LOPRESSOR) 25 MG tablet Take 1 tablet (25 mg total) by mouth 2 (two) times daily. 180 tablet 0  . nitroGLYCERIN (NITROSTAT) 0.4 MG SL tablet Place 0.4 mg under the tongue every 5 (five) minutes as needed.       No current facility-administered medications on file prior to visit.    Allergies  Allergen Reactions  . Penicillins     Past Medical History  Diagnosis Date  . Coronary artery disease     s/p NSTEMI 01/2008  -- s/p BMS to RCA 01/2008  --  s/p XIENCE DES TO OM2 12/2008  --  OTW NONOBS DZS AND PATENT RCA STENT, EF 60%   . Hypertension   . Hyperlipidemia, mixed   . Diabetes mellitus   . Obesity   . GERD (gastroesophageal reflux disease)     Past Surgical History  Procedure Laterality Date  . Cardiac  catheterization  12/22/2008     Triple-vessel coronary artery disease -- Patent stent in the proximal right coronary artery -- Severe stenosis of the second obtuse marginal branch -- Successful percutaneous coronary intervention with placement of a Xience drug-eluting stent in the second obtuse marginal branch.  -- Normal left ventricular systolic function    History  Smoking status  . Never Smoker   Smokeless tobacco  . Not on file    History  Alcohol Use No    Family History  Problem Relation Age of Onset  . Cerebral aneurysm Mother   . Diabetes Mother   . COPD Father   . Bone cancer Father   . Lung cancer Father   . Lymphoma Sister     Review of Systems: The review of systems is positive for chronic right hip pain.  All other systems were reviewed and are negative.  Physical Exam: BP 110/80 mmHg  Pulse 72  Ht  (1.778 m)  Wt 107.82 kg (237 lb 11.2 oz)  BMI 34.11 kg/m2 He is a pleasant obese WM in NAD.  HEENT: [place]/AT, PERRLA, EOMI, sclera are clear. Oropharynx is clear. He has a heavy beard. Neck: no JVD, adenopathy,  thyromegaly, or bruits. Lungs: clear. CV: RRR, normal S1-2, no gallop, rub, click. No murmur. PMI is normal. Abd: obese soft , NT, BS+, no masses or HSM. Ext: no cyanosis or edema. Pulses 2+ Skin: warm and dry Neuro: alert and oriented x 3, CN II-XII intact.  LABORATORY DATA:  Ecg today shows NSR with a normal Ecg. I have personally reviewed and interpreted this study.   Assessment / Plan: 1. CAD s/p BMS of RCA 2009, s/p DES OM2 2010. Asymptomatic. Continue risk factor modification. Continue current medications.  I will follow up in 1 year.  2. HTN- controlled.  3. DM type 2- it sounds like he has had an excellent response to Invokana.   4. Hyperlipidemia. Continue high dose Crestor.

## 2015-01-27 NOTE — Patient Instructions (Signed)
Continue your current therapy  I will see you in one year   

## 2015-01-30 ENCOUNTER — Other Ambulatory Visit: Payer: Self-pay

## 2015-01-30 MED ORDER — ROSUVASTATIN CALCIUM 40 MG PO TABS: 40.0000 mg | ORAL_TABLET | Freq: Every day | ORAL | Status: DC

## 2015-02-20 ENCOUNTER — Other Ambulatory Visit: Payer: Self-pay | Admitting: Cardiology

## 2015-04-01 ENCOUNTER — Other Ambulatory Visit: Payer: Self-pay | Admitting: Cardiology

## 2015-04-11 ENCOUNTER — Other Ambulatory Visit: Payer: Self-pay | Admitting: Cardiology

## 2015-05-26 ENCOUNTER — Telehealth: Payer: Self-pay

## 2015-05-30 NOTE — Telephone Encounter (Signed)
Fax received regarding Patient Assistance for Crestor. [employer] is eliminating this program completely, as of Nov 1. Patient will need to get the generic through his private insurance.

## 2015-11-27 ENCOUNTER — Other Ambulatory Visit: Payer: Self-pay | Admitting: Cardiology

## 2015-11-27 NOTE — Telephone Encounter (Signed)
Rx Refill

## 2016-02-09 ENCOUNTER — Other Ambulatory Visit: Payer: Self-pay | Admitting: Cardiology

## 2016-03-27 ENCOUNTER — Other Ambulatory Visit: Payer: Self-pay | Admitting: Cardiology

## 2016-04-02 ENCOUNTER — Telehealth: Payer: Self-pay | Admitting: *Deleted

## 2016-04-02 ENCOUNTER — Other Ambulatory Visit: Payer: Self-pay | Admitting: *Deleted

## 2016-04-02 MED ORDER — LISINOPRIL 20 MG PO TABS: 20.0000 mg | ORAL_TABLET | Freq: Every day | ORAL | 0 refills | Status: DC

## 2016-04-02 MED ORDER — CLOPIDOGREL BISULFATE 75 MG PO TABS: 75.0000 mg | ORAL_TABLET | Freq: Every day | ORAL | 0 refills | Status: DC

## 2016-04-02 NOTE — Telephone Encounter (Signed)
To clarify, protocol is to not fill for more then 30 days due to being overdue for his yearly appt UNLESS we get clearance from the Doctor, thus why these calls are sent to the RN's for them to make the decision or to verify with the Doctor and give us the green light, otherwise no we will not fill it. Just wanted to clear that up, thanks :)

## 2016-04-02 NOTE — Telephone Encounter (Signed)
Spoke with wife and per wife they pay out of pocket for Clopidogrel fpr 30 days is $12 and $25 for 6 months supply. Patient scheduled for his 1 year appointment in May but nothing available until later this month. Refilled for 6 month supply, patient has no noted no shows

## 2016-04-02 NOTE — Telephone Encounter (Signed)
Approved    Disp Refills Start End  clopidogrel (PLAVIX) 75 MG tablet 180 tablet 0 04/02/2016   Sig - Route:  Take 1 tablet (75 mg total) by mouth daily. - Oral  Class:  Normal  DAW:  No  Comment:  D/C 30 day Rx  Authorizing Provider:  Peter M SwazilandJordan, MD  Ordering User:  Burnell BlanksMelinda B Pratt

## 2016-04-02 NOTE — Telephone Encounter (Signed)
EXPLAINED THAT PT CAN ONLY GET A 30 DAY REFILL AND ASK FOR MORE REFILLS AT APPT FOR PLAVIX, SHE WAS NOT HAPPY ABOUT THAT, I SENT HER TO SCHEDULING TO SEE IF SHE COULD GET HIS APPT EARLIER. SHE CAN CHOOSE THEN ABOUT HOW SHE WANTS TO PAY FOR PT'S MEDICATION. I HAVE EXPLAINED THE POLICY TO HER AND THAT WE CAN ONLY GIVE PT 30 DAYS UNTIL HIS APPT. FOR HE IS OVERDUE HIS YEARLY.

## 2016-04-02 NOTE — Telephone Encounter (Signed)
Awilda BillBrandy J Delanda Bulluck, CMA   Conversation  (Newest Message First)  Me      04/02/16 12:13 PM  Note    EXPLAINED THAT PT CAN ONLY GET A 30 DAY REFILL AND ASK FOR MORE REFILLS AT APPT FOR PLAVIX, SHE WAS NOT HAPPY ABOUT THAT, I SENT HER TO SCHEDULING TO SEE IF SHE COULD GET HIS APPT EARLIER. SHE CAN CHOOSE THEN ABOUT HOW SHE WANTS TO PAY FOR PT'S MEDICATION. I HAVE EXPLAINED THE POLICY TO HER AND THAT WE CAN ONLY GIVE PT 30 DAYS UNTIL HIS APPT. FOR HE IS OVERDUE HIS YEARLY.

## 2016-04-02 NOTE — Telephone Encounter (Signed)
called to follow up about appt & refill, they couldnt get the appt moved, i have sent messages to the RN and pt aware of this, she expressed understanding.

## 2016-04-08 ENCOUNTER — Encounter: Payer: Self-pay | Admitting: Cardiology

## 2016-04-18 NOTE — Progress Notes (Signed)
[name] Date of Birth: 08/14/1955 Medical Record #[id number]  History of Present Illness: [name] is seen for follow up CAD. He has a history of CAD s/p NSTEMI in 2009 with BMS of the RCA. In 2010 he had stenting of the OM2 with a DES. He reports he has done very well since then without recurrent chest pain or SOB.  His labs are followed by his primary care in [place], Dr. Dewayne Shorter. He also has a history of DM type 2, HTN, and hyperlipidemia. He reports his A1c was  9. He was started on Invokana. He did have right shoulder surgery 3 weeks ago but states they we unable to fix it.   Current Outpatient Prescriptions on File Prior to Visit  Medication Sig Dispense Refill  . aspirin 81 MG tablet Take 81 mg by mouth daily.      . canagliflozin (INVOKANA) 100 MG TABS tablet Take 100 mg by mouth daily.    . clopidogrel (PLAVIX) 75 MG tablet Take 1 tablet (75 mg total) by mouth daily. 180 tablet 0  . glipiZIDE (GLUCOTROL XL) 10 MG 24 hr tablet Take 10 mg by mouth daily.      [name] lisinopril (PRINIVIL,ZESTRIL) 20 MG tablet Take 1 tablet (20 mg total) by mouth daily. 30 tablet 0  . metFORMIN (GLUCOPHAGE-XR) 500 MG 24 hr tablet Take 1,000 mg by mouth 2 (two) times daily.      . metoprolol tartrate (LOPRESSOR) 25 MG tablet TAKE ONE TABLET BY MOUTH TWICE DAILY 180 tablet 3  . nitroGLYCERIN (NITROSTAT) 0.4 MG SL tablet Place 0.4 mg under the tongue every 5 (five) minutes as needed.      . rosuvastatin (CRESTOR) 40 MG tablet TAKE 1 TABLET BY MOUTH ONCE DAILY 90 tablet 0   No current facility-administered medications on file prior to visit.     Allergies  Allergen Reactions  . Penicillins     Past Medical History:  Diagnosis Date  . Coronary artery disease    s/p NSTEMI 01/2008  -- s/p BMS to RCA 01/2008  --  s/p XIENCE DES TO OM2 12/2008  --  OTW NONOBS DZS AND PATENT RCA STENT, EF 60%   . Diabetes mellitus   . GERD (gastroesophageal reflux disease)   . Hyperlipidemia, mixed   .  Hypertension   . Obesity     Past Surgical History:  Procedure Laterality Date  . CARDIAC CATHETERIZATION  12/22/2008    Triple-vessel coronary artery disease -- Patent stent in the proximal right coronary artery -- Severe stenosis of the second obtuse marginal branch -- Successful percutaneous coronary intervention with placement of a Xience drug-eluting stent in the second obtuse marginal branch.  -- Normal left ventricular systolic function    History  Smoking Status  . Never Smoker  Smokeless Tobacco  . Never Used    History  Alcohol Use No    Family History  Problem Relation Age of Onset  . Cerebral aneurysm Mother   . Diabetes Mother   . COPD Father   . Bone cancer Father   . Lung cancer Father   . Lymphoma Sister     Review of Systems: The review of systems is positive for chronic right hip pain.  All other systems were reviewed and are negative.  Physical Exam: BP 124/90   Pulse 89   Ht 5\' 10"  (1.778 m)   Wt 232 lb (105.2 kg)   BMI 33.29 kg/m  He is a pleasant obese WM  in NAD.  HEENT: [place]/AT, PERRLA, EOMI, sclera are clear. Oropharynx is clear. He has a heavy beard. Neck: no JVD, adenopathy, thyromegaly, or bruits. Lungs: clear. CV: RRR, normal S1-2, no gallop, rub, click. No murmur. PMI is normal. Abd: obese soft , NT, BS+, no masses or HSM. Ext: no cyanosis or edema. Pulses 2+ Skin: warm and dry Neuro: alert and oriented x 3, CN II-XII intact.  LABORATORY DATA:  Ecg today shows NSR with PACs. Nonspecific TWA.  I have personally reviewed and interpreted this study.   Assessment / Plan: 1. CAD s/p BMS of RCA 2009, s/p DES OM2 2010. Asymptomatic. Continue risk factor modification. Continue current medications except he may stop Plavix.  I will follow up in 1 year.  2. HTN- controlled.  3. DM type 2-per primary care.   4. Hyperlipidemia. Continue high dose Crestor. Labs are followed by primary care.

## 2016-04-19 ENCOUNTER — Other Ambulatory Visit: Payer: Self-pay

## 2016-04-19 ENCOUNTER — Encounter: Payer: Self-pay | Admitting: Cardiology

## 2016-04-19 ENCOUNTER — Ambulatory Visit (INDEPENDENT_AMBULATORY_CARE_PROVIDER_SITE_OTHER): Payer: Self-pay | Admitting: Cardiology

## 2016-04-19 VITALS — BP 124/90 | HR 89 | Ht 70.0 in | Wt 232.0 lb

## 2016-04-19 DIAGNOSIS — I10 Essential (primary) hypertension: Secondary | ICD-10-CM

## 2016-04-19 DIAGNOSIS — E785 Hyperlipidemia, unspecified: Secondary | ICD-10-CM | POA: Insufficient documentation

## 2016-04-19 DIAGNOSIS — I2510 Atherosclerotic heart disease of native coronary artery without angina pectoris: Secondary | ICD-10-CM

## 2016-04-19 DIAGNOSIS — E669 Obesity, unspecified: Secondary | ICD-10-CM

## 2016-04-19 DIAGNOSIS — E1169 Type 2 diabetes mellitus with other specified complication: Secondary | ICD-10-CM

## 2016-04-19 DIAGNOSIS — E119 Type 2 diabetes mellitus without complications: Secondary | ICD-10-CM

## 2016-04-19 MED ORDER — NITROGLYCERIN 0.4 MG SL SUBL: 0.4000 mg | SUBLINGUAL_TABLET | SUBLINGUAL | 11 refills | Status: AC | PRN

## 2016-04-19 MED ORDER — ROSUVASTATIN CALCIUM 40 MG PO TABS: 40.0000 mg | ORAL_TABLET | Freq: Every day | ORAL | 3 refills | Status: DC

## 2016-04-19 MED ORDER — METOPROLOL TARTRATE 25 MG PO TABS: 25.0000 mg | ORAL_TABLET | Freq: Two times a day (BID) | ORAL | 3 refills | Status: DC

## 2016-04-19 MED ORDER — LISINOPRIL 20 MG PO TABS: 20.0000 mg | ORAL_TABLET | Freq: Every day | ORAL | 3 refills | Status: DC

## 2016-04-19 NOTE — Patient Instructions (Addendum)
Stop taking Plavix  Continue your current therapy  I will see you in one year

## 2017-04-28 ENCOUNTER — Ambulatory Visit: Payer: Self-pay | Admitting: Cardiology

## 2017-05-02 ENCOUNTER — Other Ambulatory Visit: Payer: Self-pay | Admitting: Cardiology

## 2017-05-02 NOTE — Telephone Encounter (Signed)
REFILL 

## 2017-05-21 NOTE — Progress Notes (Deleted)
Chad Maddox Date of Birth: [birth year] Medical Record #[id number]  History of Present Illness: [name] is seen for follow up CAD. He has a history of CAD s/p NSTEMI in 2009 with BMS of the RCA. In 2010 he had stenting of the OM2 with a DES. He reports he has done very well since then without recurrent chest pain or SOB.  His labs are followed by his primary care in [place], Dr. Dewayne Shorter. He also has a history of DM type 2, HTN, and hyperlipidemia. He reports his A1c was  9. He was started on Invokana. He did have right shoulder surgery 3 weeks ago but states they we unable to fix it.   Current Outpatient Prescriptions on File Prior to Visit  Medication Sig Dispense Refill  . aspirin 81 MG tablet Take 81 mg by mouth daily.      . canagliflozin (INVOKANA) 100 MG TABS tablet Take 100 mg by mouth daily.    [name] glipiZIDE (GLUCOTROL XL) 10 MG 24 hr tablet Take 10 mg by mouth daily.      [name] lisinopril (PRINIVIL,ZESTRIL) 20 MG tablet Take 1 tablet (20 mg total) by mouth daily. 90 tablet 3  . metFORMIN (GLUCOPHAGE-XR) 500 MG 24 hr tablet Take 1,000 mg by mouth 2 (two) times daily.      . metoprolol tartrate (LOPRESSOR) 25 MG tablet Take 1 tablet (25 mg total) by mouth 2 (two) times daily. 180 tablet 3  . nitroGLYCERIN (NITROSTAT) 0.4 MG SL tablet Place 1 tablet (0.4 mg total) under the tongue every 5 (five) minutes as needed. 25 tablet 11  . rosuvastatin (CRESTOR) 40 MG tablet Take 1 tablet by mouth every day 90 tablet 0   No current facility-administered medications on file prior to visit.     Allergies  Allergen Reactions  . Penicillins     Past Medical History:  Diagnosis Date  . Coronary artery disease    s/p NSTEMI 01/2008  -- s/p BMS to RCA 01/2008  --  s/p XIENCE DES TO OM2 12/2008  --  OTW NONOBS DZS AND PATENT RCA STENT, EF 60%   . Diabetes mellitus   . GERD (gastroesophageal reflux disease)   . Hyperlipidemia, mixed   . Hypertension   . Obesity     Past Surgical  History:  Procedure Laterality Date  . CARDIAC CATHETERIZATION  12/22/2008    Triple-vessel coronary artery disease -- Patent stent in the proximal right coronary artery -- Severe stenosis of the second obtuse marginal branch -- Successful percutaneous coronary intervention with placement of a Xience drug-eluting stent in the second obtuse marginal branch.  -- Normal left ventricular systolic function    History  Smoking Status  . Never Smoker  Smokeless Tobacco  . Never Used    History  Alcohol Use No    Family History  Problem Relation Age of Onset  . Cerebral aneurysm Mother   . Diabetes Mother   . COPD Father   . Bone cancer Father   . Lung cancer Father   . Lymphoma Sister     Review of Systems: The review of systems is positive for chronic right hip pain.  All other systems were reviewed and are negative.  Physical Exam: There were no vitals taken for this visit. He is a pleasant obese WM in NAD.  HEENT: [place]/AT, PERRLA, EOMI, sclera are clear. Oropharynx is clear. He has a heavy beard. Neck: no JVD, adenopathy, thyromegaly, or bruits. Lungs: clear. CV: RRR, normal  S1-2, no gallop, rub, click. No murmur. PMI is normal. Abd: obese soft , NT, BS+, no masses or HSM. Ext: no cyanosis or edema. Pulses 2+ Skin: warm and dry Neuro: alert and oriented x 3, CN II-XII intact.  LABORATORY DATA:  Ecg today shows NSR with PACs. Nonspecific TWA.  I have personally reviewed and interpreted this study.   Assessment / Plan: 1. CAD s/p BMS of RCA 2009, s/p DES OM2 2010. Asymptomatic. Continue risk factor modification. Continue current medications except he may stop Plavix.  I will follow up in 1 year.  2. HTN- controlled.  3. DM type 2-per primary care.   4. Hyperlipidemia. Continue high dose Crestor. Labs are followed by primary care.

## 2017-05-27 ENCOUNTER — Ambulatory Visit: Payer: Self-pay | Admitting: Cardiology

## 2017-06-02 ENCOUNTER — Ambulatory Visit: Payer: Self-pay | Admitting: Physician Assistant

## 2017-06-02 NOTE — Progress Notes (Deleted)
Cardiology Office Note    Date:  06/02/2017   ID:  Chad Maddox, DOB 14-Feb-1956, MRN [id number]  PCP:  System, Pcp Not In  Cardiologist:  ***   No chief complaint on file.   History of Present Illness:  Chad DrainDavid Nissley is a 61 y.o. male ***  Yes EKG  Past Medical History:  Diagnosis Date  . Coronary artery disease    s/p NSTEMI 01/2008  -- s/p BMS to RCA 01/2008  --  s/p XIENCE DES TO OM2 12/2008  --  OTW NONOBS DZS AND PATENT RCA STENT, EF 60%   . Diabetes mellitus   . GERD (gastroesophageal reflux disease)   . Hyperlipidemia, mixed   . Hypertension   . Obesity     Past Surgical History:  Procedure Laterality Date  . CARDIAC CATHETERIZATION  12/22/2008    Triple-vessel coronary artery disease -- Patent stent in the proximal right coronary artery -- Severe stenosis of the second obtuse marginal branch -- Successful percutaneous coronary intervention with placement of a Xience drug-eluting stent in the second obtuse marginal branch.  -- Normal left ventricular systolic function    Current Medications: Outpatient Medications Prior to Visit  Medication Sig Dispense Refill  . aspirin 81 MG tablet Take 81 mg by mouth daily.      . canagliflozin (INVOKANA) 100 MG TABS tablet Take 100 mg by mouth daily.    [name]. glipiZIDE (GLUCOTROL XL) 10 MG 24 hr tablet Take 10 mg by mouth daily.      [name]. lisinopril (PRINIVIL,ZESTRIL) 20 MG tablet Take 1 tablet (20 mg total) by mouth daily. 90 tablet 3  . metFORMIN (GLUCOPHAGE-XR) 500 MG 24 hr tablet Take 1,000 mg by mouth 2 (two) times daily.      . metoprolol tartrate (LOPRESSOR) 25 MG tablet Take 1 tablet (25 mg total) by mouth 2 (two) times daily. 180 tablet 3  . nitroGLYCERIN (NITROSTAT) 0.4 MG SL tablet Place 1 tablet (0.4 mg total) under the tongue every 5 (five) minutes as needed. 25 tablet 11  . rosuvastatin (CRESTOR) 40 MG tablet Take 1 tablet by mouth every day 90 tablet 0   No facility-administered medications prior to visit.       Allergies:   Penicillins   Social History   Socioeconomic History  . Marital status: Married    Spouse name: Not on file  . Number of children: Not on file  . Years of education: Not on file  . Highest education level: Not on file  Social Needs  . Financial resource strain: Not on file  . Food insecurity - worry: Not on file  . Food insecurity - inability: Not on file  . Transportation needs - medical: Not on file  . Transportation needs - non-medical: Not on file  Occupational History  . Not on file  Tobacco Use  . Smoking status: Never Smoker  . Smokeless tobacco: Never Used  Substance and Sexual Activity  . Alcohol use: No  . Drug use: No  . Sexual activity: Not on file  Other Topics Concern  . Not on file  Social History Narrative  . Not on file     Family History:  The patient's ***family history includes Bone cancer in his father; COPD in his father; Cerebral aneurysm in his mother; Diabetes in his mother; Lung cancer in his father; Lymphoma in his sister.   ROS:   Please see the history of present illness.    ROS All other systems reviewed  and are negative.   PHYSICAL EXAM:   VS:  There were no vitals taken for this visit.   GEN: Well nourished, well developed, in no acute distress HEENT: normal Neck: no JVD, carotid bruits, or masses Cardiac: ***RRR; no murmurs, rubs, or gallops,no edema  Respiratory:  clear to auscultation bilaterally, normal work of breathing GI: soft, nontender, nondistended, + BS MS: no deformity or atrophy Skin: warm and dry, no rash Neuro:  Alert and Oriented x 3, Strength and sensation are intact Psych: euthymic mood, full affect  Wt Readings from Last 3 Encounters:  04/19/16 232 lb (105.2 kg)  01/27/15 237 lb 11.2 oz (107.8 kg)  10/19/13 261 lb 6.4 oz (118.6 kg)      Studies/Labs Reviewed:   EKG:  EKG is*** ordered today.  The ekg ordered today demonstrates ***  Recent Labs: No results found for requested labs within  last 8760 hours.   Lipid Panel    Component Value Date/Time   CHOL  12/23/2008 0604    158        ATP III CLASSIFICATION:  <200     mg/dL   Desirable  [id number]  mg/dL   Borderline High  >=045>=240    mg/dL   High          TRIG 409265 (H) 12/23/2008 0604   HDL 29 (L) 12/23/2008 0604   CHOLHDL 5.4 12/23/2008 0604   VLDL 53 (H) 12/23/2008 0604   LDLCALC  12/23/2008 0604    76        Total Cholesterol/HDL:CHD Risk Coronary Heart Disease Risk Table                     Men   Women  1/2 Average Risk   3.4   3.3  Average Risk       5.0   4.4  2 X Average Risk   9.6   7.1  3 X Average Risk  23.4   11.0        Use the calculated Patient Ratio above and the CHD Risk Table to determine the patient's CHD Risk.        ATP III CLASSIFICATION (LDL):  <100     mg/dL   Optimal  [id number]  mg/dL   Near or Above                    Optimal  130-159  mg/dL   Borderline  [id number]  mg/dL   High  >213>190     mg/dL   Very High   LDLDIRECT 64.6 06/27/2008 0925    Additional studies/ records that were reviewed today include:  ***    ASSESSMENT:    No diagnosis found.   PLAN:  In order of problems listed above:  1. ***    Medication Adjustments/Labs and Tests Ordered: Current medicines are reviewed at length with the patient today.  Concerns regarding medicines are outlined above.  Medication changes, Labs and Tests ordered today are listed in the Patient Instructions below. There are no Patient Instructions on file for this visit.   Ramond DialSigned, Kaiden Dardis, [place]  06/02/2017 10:17 AM    [facility] [address], [place], [place]  [address] Phone: [phone]; Fax: [phone]

## 2017-06-03 ENCOUNTER — Encounter: Payer: Self-pay | Admitting: *Deleted

## 2017-08-26 NOTE — Progress Notes (Deleted)
Chad Maddox Date of Birth: 1956/06/05 Medical Record #[id number]  History of Present Illness: [name] is seen for follow up CAD. He has a history of CAD s/p NSTEMI in 2009 with BMS of the RCA. In 2010 he had stenting of the OM2 with a DES. He reports he has done very well since then without recurrent chest pain or SOB.  His labs are [place] in [place], Dr. Dewayne ShorterJames Maddox. He also has a history of DM type 2, HTN, and hyperlipidemia. He reports his A1c was  9. He was started on Invokana. He did have right shoulder surgery 3 weeks ago but states they we unable to fix it.   Current Outpatient Medications on File Prior to Visit  Medication Sig Dispense Refill  . aspirin 81 MG tablet Take 81 mg by mouth daily.      . canagliflozin (INVOKANA) 100 MG TABS tablet Take 100 mg by mouth daily.    [name]. glipiZIDE (GLUCOTROL XL) 10 MG 24 hr tablet Take 10 mg by mouth daily.      [name]. lisinopril (PRINIVIL,ZESTRIL) 20 MG tablet Take 1 tablet (20 mg total) by mouth daily. 90 tablet 3  . metFORMIN (GLUCOPHAGE-XR) 500 MG 24 hr tablet Take 1,000 mg by mouth 2 (two) times daily.      . metoprolol tartrate (LOPRESSOR) 25 MG tablet Take 1 tablet (25 mg total) by mouth 2 (two) times daily. 180 tablet 3  . nitroGLYCERIN (NITROSTAT) 0.4 MG SL tablet Place 1 tablet (0.4 mg total) under the tongue every 5 (five) minutes as needed. 25 tablet 11  . rosuvastatin (CRESTOR) 40 MG tablet Take 1 tablet by mouth every day 90 tablet 0   No current facility-administered medications on file prior to visit.     Allergies  Allergen Reactions  . Penicillins     Past Medical History:  Diagnosis Date  . Coronary artery disease    s/p NSTEMI 01/2008  -- s/p BMS to RCA 01/2008  --  s/p XIENCE DES TO OM2 12/2008  --  OTW NONOBS DZS AND PATENT RCA STENT, EF 60%   . Diabetes mellitus   . GERD (gastroesophageal reflux disease)   . Hyperlipidemia, mixed   . Hypertension   . Obesity     Past Surgical  History:  Procedure Laterality Date  . CARDIAC CATHETERIZATION  12/22/2008    Triple-vessel coronary artery disease -- Patent stent in the proximal right coronary artery -- Severe stenosis of the second obtuse marginal branch -- Successful percutaneous coronary intervention with placement of a Xience drug-eluting stent in the second obtuse marginal branch.  -- Normal left ventricular systolic function    Social History   Tobacco Use  Smoking Status Never Smoker  Smokeless Tobacco Never Used    Social History   Substance and Sexual Activity  Alcohol Use No    Family History  Problem Relation Age of Onset  . Cerebral aneurysm Mother   . Diabetes Mother   . COPD Father   . Bone cancer Father   . Lung cancer Father   . Lymphoma Sister     Review of Systems: The review of systems is positive for chronic right hip pain.  All other systems were reviewed and are negative.  Physical Exam: There were no vitals taken for this visit. He is a pleasant obese WM in NAD.  HEENT: [place]/AT, PERRLA, EOMI, sclera are clear. Oropharynx is clear. He has a heavy beard. Neck: no JVD, adenopathy,  thyromegaly, or bruits. Lungs: clear. CV: RRR, normal S1-2, no gallop, rub, click. No murmur. PMI is normal. Abd: obese soft , NT, BS+, no masses or HSM. Ext: no cyanosis or edema. Pulses 2+ Skin: warm and dry Neuro: alert and oriented x 3, CN II-XII intact.  LABORATORY DATA:  Ecg today shows NSR with PACs. Nonspecific TWA.  I have personally reviewed and interpreted this study.   Assessment / Plan: 1. CAD s/p BMS of RCA 2009, s/p DES OM2 2010. Asymptomatic. Continue risk factor modification. Continue current medications except he may stop Plavix.  I will follow up in 1 year.  2. HTN- controlled.  3. DM type 2-per primary care.   4. Hyperlipidemia. Continue high dose Crestor. Labs are followed by primary care.

## 2017-08-28 ENCOUNTER — Ambulatory Visit: Payer: Self-pay | Admitting: Cardiology

## 2017-11-18 NOTE — Progress Notes (Signed)
[name] Date of Birth: 1955-08-07 Medical Record #[id number]  History of Present Illness: [name] is seen for follow up CAD. Last seen July 2016. He has a history of CAD s/p NSTEMI in 2009 with BMS of the RCA. In 2010 he had stenting of the OM2 with a DES.   He reports he has done very well since then without recurrent chest pain or SOB. He is still working some as a Musician. He is limited by a bad hip and right shoulder.  His labs are followed by his primary care in [place], Dr. Dewayne Shorter. He also has a history of DM type 2, HTN, and hyperlipidemia. He reports his A1c was  9. Never had typical angina before.  Current Outpatient Medications on File Prior to Visit  Medication Sig Dispense Refill  . aspirin 81 MG tablet Take 81 mg by mouth daily.      . canagliflozin (INVOKANA) 100 MG TABS tablet Take 100 mg by mouth daily.    [name] glipiZIDE (GLUCOTROL XL) 10 MG 24 hr tablet Take 10 mg by mouth daily.      [name] lisinopril (PRINIVIL,ZESTRIL) 20 MG tablet Take 1 tablet (20 mg total) by mouth daily. 90 tablet 3  . metFORMIN (GLUCOPHAGE-XR) 500 MG 24 hr tablet Take 1,000 mg by mouth 2 (two) times daily.      . metoprolol tartrate (LOPRESSOR) 25 MG tablet Take 1 tablet (25 mg total) by mouth 2 (two) times daily. 180 tablet 3  . nitroGLYCERIN (NITROSTAT) 0.4 MG SL tablet Place 1 tablet (0.4 mg total) under the tongue every 5 (five) minutes as needed. 25 tablet 11  . rosuvastatin (CRESTOR) 40 MG tablet Take 1 tablet by mouth every day 90 tablet 0   No current facility-administered medications on file prior to visit.     Allergies  Allergen Reactions  . Penicillins     Past Medical History:  Diagnosis Date  . Coronary artery disease    s/p NSTEMI 01/2008  -- s/p BMS to RCA 01/2008  --  s/p XIENCE DES TO OM2 12/2008  --  OTW NONOBS DZS AND PATENT RCA STENT, EF 60%   . Diabetes mellitus   . GERD (gastroesophageal reflux disease)   . Hyperlipidemia, mixed   . Hypertension   .  Obesity     Past Surgical History:  Procedure Laterality Date  . CARDIAC CATHETERIZATION  12/22/2008    Triple-vessel coronary artery disease -- Patent stent in the proximal right coronary artery -- Severe stenosis of the second obtuse marginal branch -- Successful percutaneous coronary intervention with placement of a Xience drug-eluting stent in the second obtuse marginal branch.  -- Normal left ventricular systolic function    Social History   Tobacco Use  Smoking Status Never Smoker  Smokeless Tobacco Never Used    Social History   Substance and Sexual Activity  Alcohol Use No    Family History  Problem Relation Age of Onset  . Cerebral aneurysm Mother   . Diabetes Mother   . COPD Father   . Bone cancer Father   . Lung cancer Father   . Lymphoma Sister     Review of Systems: The review of systems is positive for chronic right hip pain.  All other systems were reviewed and are negative.  Physical Exam: BP 116/80 (BP Location: Left Arm, Patient Position: Sitting, Cuff Size: Normal)   Pulse 69   Ht  (1.778 m)   Wt 228 lb (103.4 kg)  BMI 32.71 kg/m  GENERAL:  Well appearing, obese WM in NAD HEENT:  PERRL, EOMI, sclera are clear. Oropharynx is clear. NECK:  No jugular venous distention, carotid upstroke brisk and symmetric, no bruits, no thyromegaly or adenopathy LUNGS:  Clear to auscultation bilaterally CHEST:  Unremarkable HEART:  RRR,  PMI not displaced or sustained,S1 and S2 within normal limits, no S3, no S4: no clicks, no rubs, no murmurs ABD:  Soft, nontender. BS +, no masses or bruits. No hepatomegaly, no splenomegaly EXT:  2 + pulses throughout, no edema, no cyanosis no clubbing. Tremor right arm SKIN:  Warm and dry.  No rashes NEURO:  Alert and oriented x 3. Cranial nerves II through XII intact. PSYCH:  Cognitively intact    LABORATORY DATA:  Ecg today shows NSR with first degree AV block  I have personally reviewed and interpreted this  study.   Assessment / Plan: 1. CAD s/p BMS of RCA 2009, s/p DES OM2 2010. Asymptomatic but never had typical symptoms. He is on ASA, beta blocker, and high dose statin. Continue risk factor modification. We discussed follow up stress testing. He is unable to walk on a treadmill so we would have to have a pharmacologic nuclear stress test. He has no insurance and cannot afford this. Given lack of symptoms we will continue to monitor clinically.  2. HTN- controlled.  3. DM type 2-per primary care.   4. Hyperlipidemia. Continue high dose Crestor. Labs are followed by primary care.

## 2017-11-20 ENCOUNTER — Other Ambulatory Visit: Payer: Self-pay

## 2017-11-20 ENCOUNTER — Encounter: Payer: Self-pay | Admitting: Cardiology

## 2017-11-20 ENCOUNTER — Ambulatory Visit (INDEPENDENT_AMBULATORY_CARE_PROVIDER_SITE_OTHER): Payer: Self-pay | Admitting: Cardiology

## 2017-11-20 VITALS — BP 116/80 | HR 69 | Ht 70.0 in | Wt 228.0 lb

## 2017-11-20 DIAGNOSIS — I10 Essential (primary) hypertension: Secondary | ICD-10-CM

## 2017-11-20 DIAGNOSIS — I2510 Atherosclerotic heart disease of native coronary artery without angina pectoris: Secondary | ICD-10-CM

## 2017-11-20 DIAGNOSIS — E7800 Pure hypercholesterolemia, unspecified: Secondary | ICD-10-CM

## 2017-11-20 DIAGNOSIS — E669 Obesity, unspecified: Secondary | ICD-10-CM

## 2017-11-20 DIAGNOSIS — E1169 Type 2 diabetes mellitus with other specified complication: Secondary | ICD-10-CM

## 2017-11-20 MED ORDER — ROSUVASTATIN CALCIUM 40 MG PO TABS: 40.0000 mg | ORAL_TABLET | Freq: Every day | ORAL | 3 refills | Status: DC

## 2017-11-20 NOTE — Patient Instructions (Signed)
Continue your current therapy  Keep trying to lose weight and eliminate sugar in your diet.  I will see you in about one year.

## 2018-07-27 ENCOUNTER — Other Ambulatory Visit: Payer: Self-pay

## 2018-07-27 MED ORDER — ROSUVASTATIN CALCIUM 40 MG PO TABS: 40.0000 mg | ORAL_TABLET | Freq: Every day | ORAL | 1 refills | Status: DC

## 2018-07-27 NOTE — Telephone Encounter (Signed)
Rx(s) sent to pharmacy electronically.  

## 2018-11-10 ENCOUNTER — Telehealth: Payer: Self-pay

## 2018-11-10 NOTE — Telephone Encounter (Signed)
Virtual Visit Pre-Appointment Phone Call  Chad Maddox, I am calling you today to discuss your upcoming appointment. We are currently trying to limit exposure to the virus that causes COVID-19 by seeing patients at home rather than in the office."  1. "What is the BEST phone number to call the day of the visit? [phone]  2. "Do you have or have access to (through a family member/friend) a smartphone with video capability that we can use for your visit?" a. If no - list the appointment type as a PHONE visit in appointment notes  3. Confirm consent - "In the setting of the current Covid19 crisis, you are scheduled for a (phone) visit with your provider on ( May 4 ) at (1:20pm).  Just as we do with many in-office visits, in order for you to participate in this visit, we must obtain consent.  If you'd like, I can send this to your mychart (if signed up) or email for you to review.  Otherwise, I can obtain your verbal consent now.  All virtual visits are billed to your insurance company just like a normal visit would be.  By agreeing to a virtual visit, we'd like you to understand that the technology does not allow for your provider to perform an examination, and thus may limit your provider's ability to fully assess your condition. If your provider identifies any concerns that need to be evaluated in person, we will make arrangements to do so.  Finally, though the technology is pretty good, we cannot assure that it will always work on either your or our end, and in the setting of a video visit, we may have to convert it to a phone-only visit.  In either situation, we cannot ensure that we have a secure connection.  Are you willing to proceed?" STAFF: Did the patient verbally acknowledge consent to telehealth visit? Document YES/NO here: YES  4. Advise patient to be prepared - "Two hours prior to your appointment, go ahead and check your blood pressure, pulse, oxygen saturation, and your weight (if  you have the equipment to check those) and write them all down. When your visit starts, your provider will ask you for this information. If you have an Apple Watch or Kardia device, please plan to have heart rate information ready on the day of your appointment. Please have a pen and paper handy nearby the day of the visit as well."  5. Give patient instructions for MyChart download to smartphone OR Doximity/Doxy.me as below if video visit (depending on what platform provider is using)  6. Inform patient they will receive a phone call 15 minutes prior to their appointment time (may be from unknown caller ID) so they should be prepared to answer    TELEPHONE CALL NOTE  Chad Maddox has been deemed a candidate for a follow-up tele-health visit to limit community exposure during the Covid-19 pandemic. I spoke with the patient via phone to ensure availability of phone/video source, confirm preferred email & phone number, and discuss instructions and expectations.  I reminded Chad Maddox to be prepared with any vital sign and/or heart rhythm information that could potentially be obtained via home monitoring, at the time of his visit. I reminded Chad Maddox to expect a phone call prior to his visit.  Benjamine Mola, CMA 11/10/2018 3:12 PM   INSTRUCTIONS FOR DOWNLOADING THE MYCHART APP TO SMARTPHONE  - The patient must first make sure to have activated MyChart and know their login  information - If Apple, go to App Store and type in MyChart in the search bar and download the app. If [employer], ask patient to go to [employer] Play Store and type in [facility] in the search bar and download the app. The app is free but as with any other app downloads, their phone may require them to verify saved payment information or Apple/Android password.  - The patient will need to then log into the app with their MyChart username and password, and select [facility] as their healthcare provider to link the account.  When it is time for your visit, go to the MyChart app, find appointments, and click Begin Video Visit. Be sure to Select Allow for your device to access the Microphone and Camera for your visit. You will then be connected, and your provider will be with you shortly.  **If they have any issues connecting, or need assistance please contact MyChart service desk [phone])**  **If using a computer, in order to ensure the best quality for their visit they will need to use either of the following Internet Browsers: [employer], or Google Chrome**  IF USING DOXIMITY or DOXY.ME - The patient will receive a link just prior to their visit by text.     FULL LENGTH CONSENT FOR TELE-HEALTH VISIT   I hereby voluntarily request, consent and authorize [facility] and its employed or contracted physicians, physician assistants, nurse practitioners or other licensed health care professionals ( Dr. [place] ), to provide me with telemedicine health care services (the "Services") as deemed necessary by the treating Practitioner. I acknowledge and consent to receive the Services by the Practitioner via telemedicine. I understand that the telemedicine visit will involve communicating with the Practitioner through live audiovisual communication technology and the disclosure of certain medical information by electronic transmission. I acknowledge that I have been given the opportunity to request an in-person assessment or other available alternative prior to the telemedicine visit and am voluntarily participating in the telemedicine visit.  I understand that I have the right to withhold or withdraw my consent to the use of telemedicine in the course of my care at any time, without affecting my right to future care or treatment, and that the Practitioner or I may terminate the telemedicine visit at any time. I understand that I have the right to inspect all information obtained and/or recorded in the  course of the telemedicine visit and may receive copies of available information for a reasonable fee.  I understand that some of the potential risks of receiving the Services via telemedicine include:  [name]. Delay or interruption in medical evaluation due to technological equipment failure or disruption; . Information transmitted may not be sufficient (e.g. poor resolution of images) to allow for appropriate medical decision making by the Practitioner; and/or  . In rare instances, security protocols could fail, causing a breach of personal health information.  Furthermore, I acknowledge that it is my responsibility to provide information about my medical history, conditions and care that is complete and accurate to the best of my ability. I acknowledge that Practitioner's advice, recommendations, and/or decision may be based on factors not within their control, such as incomplete or inaccurate data provided by me or distortions of diagnostic images or specimens that may result from electronic transmissions. I understand that the practice of medicine is not an exact science and that Practitioner makes no warranties or guarantees regarding treatment outcomes. I acknowledge that I will receive a copy of this consent  concurrently upon execution via email to the email address I last provided but may also request a printed copy by calling the office of [facility].    I understand that my insurance will be billed for this visit.   I have read or had this consent read to me. . I understand the contents of this consent, which adequately explains the benefits and risks of the Services being provided via telemedicine.  . I have been provided ample opportunity to ask questions regarding this consent and the Services and have had my questions answered to my satisfaction. . I give my informed consent for the services to be provided through the use of telemedicine in my medical care  By participating in this  telemedicine visit I agree to the above.

## 2018-11-18 NOTE — Progress Notes (Deleted)
{Choose 1 Note Type (Telehealth Visit or Telephone Visit):[phone]}   Evaluation Performed:  Follow-up visit  Date:  11/18/2018   ID:  Chad Maddox, DOB [birth year], MRN [id number]  Patient Location: Home Provider Location: Home  PCP:  System, Pcp Not In  Cardiologist:   [place] MD Electrophysiologist:  None   Chief Complaint:  Follow up CAD  History of Present Illness:    Chad Maddox is a 63 y.o. male with He has a history of CAD s/p NSTEMI in 2009 with BMS of the RCA. In 2010 he had stenting of the OM2 with a DES.   He is still working some as a Musician.  His labs are followed by his primary care in [place], Dr. Dewayne Shorter. He also has a history of DM type 2, HTN, and hyperlipidemia. He reports his A1c was  9. Never had typical angina before.  The patient {does/does not:200015} have symptoms concerning for COVID-19 infection (fever, chills, cough, or new shortness of breath).    Past Medical History:  Diagnosis Date  . Coronary artery disease    s/p NSTEMI 01/2008  -- s/p BMS to RCA 01/2008  --  s/p XIENCE DES TO OM2 12/2008  --  OTW NONOBS DZS AND PATENT RCA STENT, EF 60%   . Diabetes mellitus   . GERD (gastroesophageal reflux disease)   . Hyperlipidemia, mixed   . Hypertension   . Obesity    Past Surgical History:  Procedure Laterality Date  . CARDIAC CATHETERIZATION  12/22/2008    Triple-vessel coronary artery disease -- Patent stent in the proximal right coronary artery -- Severe stenosis of the second obtuse marginal branch -- Successful percutaneous coronary intervention with placement of a Xience drug-eluting stent in the second obtuse marginal branch.  -- Normal left ventricular systolic function     No outpatient medications have been marked as taking for the 11/23/18 encounter (Appointment) with [place],  M, MD.     Allergies:   Penicillins   Social History   Tobacco Use  . Smoking status: Never Smoker  . Smokeless tobacco: Never Used   Substance Use Topics  . Alcohol use: No  . Drug use: No     Family Hx: The patient's family history includes Bone cancer in his father; COPD in his father; Cerebral aneurysm in his mother; Diabetes in his mother; Lung cancer in his father; Lymphoma in his sister.  ROS:   Please see the history of present illness.    *** All other systems reviewed and are negative.   Prior CV studies:   The following studies were reviewed today:  none  Labs/Other Tests and Data Reviewed:    EKG:  {EKG/Telemetry Strips [phone]}  Recent Labs: No results found for requested labs within last 8760 hours.   Recent Lipid Panel Lab Results  Component Value Date/Time   CHOL  12/23/2008 06:04 AM    158        ATP III CLASSIFICATION:  <200     mg/dL   Desirable  681-157  mg/dL   Borderline High  >=262    mg/dL   High          TRIG 035 (H) 12/23/2008 06:04 AM   HDL 29 (L) 12/23/2008 06:04 AM   CHOLHDL 5.4 12/23/2008 06:04 AM   LDLCALC  12/23/2008 06:04 AM    76        Total Cholesterol/HDL:CHD Risk Coronary Heart Disease Risk Table  Men   Women  1/2 Average Risk   3.4   3.3  Average Risk       5.0   4.4  2 X Average Risk   9.6   7.1  3 X Average Risk  23.4   11.0        Use the calculated Patient Ratio above and the CHD Risk Table to determine the patient's CHD Risk.        ATP III CLASSIFICATION (LDL):  <100     mg/dL   Optimal  161-096100-129  mg/dL   Near or Above                    Optimal  130-159  mg/dL   Borderline  [id number]  mg/dL   High  >811>190     mg/dL   Very High   LDLDIRECT 64.6 06/27/2008 09:25 AM    Wt Readings from Last 3 Encounters:  11/20/17 228 lb (103.4 kg)  04/19/16 232 lb (105.2 kg)  01/27/15 237 lb 11.2 oz (107.8 kg)     Objective:    Vital Signs:  There were no vitals taken for this visit.   {[facility] Virtual Exam (Optional):[phone]::"VITAL SIGNS:  reviewed"}  ASSESSMENT & PLAN:    1. CAD s/p BMS of RCA 2009, s/p DES  OM2 2010. Asymptomatic but never had typical symptoms. He is on ASA, beta blocker, and high dose statin. Continue risk factor modification. We discussed follow up stress testing. He is unable to walk on a treadmill so we would have to have a pharmacologic nuclear stress test. He has no insurance and cannot afford this. Given lack of symptoms we will continue to monitor clinically.  2. HTN- controlled.  3. DM type 2-per primary care.   4. Hyperlipidemia. Continue high dose Crestor. Labs are followed by primary care.  COVID-19 Education: The signs and symptoms of COVID-19 were discussed with the patient and how to seek care for testing (follow up with PCP or arrange E-visit).  ***The importance of social distancing was discussed today.  Time:   Today, I have spent *** minutes with the patient with telehealth technology discussing the above problems.     Medication Adjustments/Labs and Tests Ordered: Current medicines are reviewed at length with the patient today.  Concerns regarding medicines are outlined above.   Tests Ordered: No orders of the defined types were placed in this encounter.   Medication Changes: No orders of the defined types were placed in this encounter.   Disposition:  Follow up {follow up:15908}  Signed,  [place], MD  11/18/2018 2:34 PM    [facility]

## 2018-11-20 ENCOUNTER — Telehealth: Payer: Self-pay | Admitting: Cardiology

## 2018-11-20 NOTE — Telephone Encounter (Signed)
Mychart pending, pre reg complete 11/20/18 AF

## 2018-11-23 ENCOUNTER — Telehealth: Payer: Medicare Other | Admitting: Cardiology

## 2019-01-21 ENCOUNTER — Telehealth: Payer: Self-pay | Admitting: Cardiology

## 2019-01-21 NOTE — Telephone Encounter (Signed)

## 2019-01-23 NOTE — Progress Notes (Signed)
Eyvonne Left Date of Birth: June 09, 1956 Medical Record #[id number]  History of Present Illness: Mr. Mcmurtrey is seen for follow up CAD. Last seen July 2016. He has a history of CAD s/p NSTEMI in 2009 with BMS of the RCA. In 2010 he had stenting of the OM2 with a DES.   He reports he has done very well since then without recurrent chest pain or SOB. He is still working some as a Production assistant, radio. He is limited by a bad hip and right shoulder. He has chronic pain. 2 recent injuries to his feet for which he is seeing a podiatrist.   His labs are followed by his primary care in [place], Dr. Alice Rieger. He also has a history of DM type 2, HTN, and hyperlipidemia. He reports his A1c was  9. BP has been well controlled.  Current Outpatient Medications on File Prior to Visit  Medication Sig Dispense Refill  . aspirin 81 MG tablet Take 81 mg by mouth daily.      . canagliflozin (INVOKANA) 100 MG TABS tablet Take 100 mg by mouth daily.    [name] glipiZIDE (GLUCOTROL XL) 10 MG 24 hr tablet Take 10 mg by mouth daily.      [name] lisinopril (PRINIVIL,ZESTRIL) 20 MG tablet Take 1 tablet (20 mg total) by mouth daily. 90 tablet 3  . metFORMIN (GLUCOPHAGE-XR) 500 MG 24 hr tablet Take 1,000 mg by mouth 2 (two) times daily.      . metoprolol tartrate (LOPRESSOR) 25 MG tablet Take 1 tablet (25 mg total) by mouth 2 (two) times daily. 180 tablet 3  . nitroGLYCERIN (NITROSTAT) 0.4 MG SL tablet Place 1 tablet (0.4 mg total) under the tongue every 5 (five) minutes as needed. 25 tablet 11  . rosuvastatin (CRESTOR) 40 MG tablet Take 1 tablet (40 mg total) by mouth daily. 90 tablet 1   No current facility-administered medications on file prior to visit.     Allergies  Allergen Reactions  . Penicillins     Past Medical History:  Diagnosis Date  . Coronary artery disease    s/p NSTEMI 01/2008  -- s/p BMS to RCA 01/2008  --  s/p XIENCE DES TO OM2 12/2008  --  OTW NONOBS DZS AND PATENT RCA STENT, EF 60%   . Diabetes mellitus    . GERD (gastroesophageal reflux disease)   . Hyperlipidemia, mixed   . Hypertension   . Obesity     Past Surgical History:  Procedure Laterality Date  . CARDIAC CATHETERIZATION  12/22/2008    Triple-vessel coronary artery disease -- Patent stent in the proximal right coronary artery -- Severe stenosis of the second obtuse marginal branch -- Successful percutaneous coronary intervention with placement of a Xience drug-eluting stent in the second obtuse marginal branch.  -- Normal left ventricular systolic function    Social History   Tobacco Use  Smoking Status Never Smoker  Smokeless Tobacco Never Used    Social History   Substance and Sexual Activity  Alcohol Use No    Family History  Problem Relation Age of Onset  . Cerebral aneurysm Mother   . Diabetes Mother   . COPD Father   . Bone cancer Father   . Lung cancer Father   . Lymphoma Sister     Review of Systems: The review of systems is positive for chronic right hip pain.  All other systems were reviewed and are negative.  Physical Exam: BP 99/65   Pulse 71   Ht 5'  10" (1.778 m)   Wt 231 lb (104.8 kg)   BMI 33.15 kg/m  GENERAL:  Well appearing, obese WM in NAD HEENT:  PERRL, EOMI, sclera are clear. Oropharynx is clear. NECK:  No jugular venous distention, carotid upstroke brisk and symmetric, no bruits, no thyromegaly or adenopathy LUNGS:  Clear to auscultation bilaterally CHEST:  Unremarkable HEART:  RRR,  PMI not displaced or sustained,S1 and S2 within normal limits, no S3, no S4: no clicks, no rubs, no murmurs ABD:  Soft, nontender. BS +, no masses or bruits. No hepatomegaly, no splenomegaly EXT:  2 + pulses throughout, no edema, no cyanosis no clubbing. Tremor right arm NEURO:  Alert and oriented x 3. Cranial nerves II through XII intact. PSYCH:  Cognitively intact    LABORATORY DATA:  Ecg today shows NSR with first degree AV block  Rate 71. Low voltage. I have personally reviewed and interpreted  this study.   Assessment / Plan: 1. CAD s/p BMS of RCA 2009, s/p DES OM2 2010. Asymptomatic. He is on ASA, beta blocker, and high dose statin. Continue risk factor modification. Follow up in one year.  2. HTN- controlled.  3. DM type 2-per primary care.   4. Hyperlipidemia. Continue high dose Crestor. Labs are followed by primary care. I have requested a copy.

## 2019-01-25 ENCOUNTER — Other Ambulatory Visit: Payer: Self-pay

## 2019-01-25 ENCOUNTER — Ambulatory Visit (INDEPENDENT_AMBULATORY_CARE_PROVIDER_SITE_OTHER): Payer: Medicare Other | Admitting: Cardiology

## 2019-01-25 ENCOUNTER — Encounter: Payer: Self-pay | Admitting: Cardiology

## 2019-01-25 VITALS — BP 99/65 | HR 71 | Ht 70.0 in | Wt 231.0 lb

## 2019-01-25 DIAGNOSIS — I10 Essential (primary) hypertension: Secondary | ICD-10-CM

## 2019-01-25 DIAGNOSIS — E7800 Pure hypercholesterolemia, unspecified: Secondary | ICD-10-CM | POA: Diagnosis not present

## 2019-01-25 DIAGNOSIS — I2510 Atherosclerotic heart disease of native coronary artery without angina pectoris: Secondary | ICD-10-CM

## 2019-02-16 ENCOUNTER — Other Ambulatory Visit: Payer: Self-pay | Admitting: Cardiology

## 2019-03-10 ENCOUNTER — Ambulatory Visit: Payer: Medicare Other | Admitting: Orthopedic Surgery

## 2019-04-19 ENCOUNTER — Ambulatory Visit: Payer: Self-pay

## 2019-04-19 ENCOUNTER — Ambulatory Visit (INDEPENDENT_AMBULATORY_CARE_PROVIDER_SITE_OTHER): Payer: Medicare Other | Admitting: Orthopedic Surgery

## 2019-04-19 ENCOUNTER — Encounter: Payer: Self-pay | Admitting: Orthopedic Surgery

## 2019-04-19 DIAGNOSIS — M19012 Primary osteoarthritis, left shoulder: Secondary | ICD-10-CM | POA: Diagnosis not present

## 2019-04-19 DIAGNOSIS — M19011 Primary osteoarthritis, right shoulder: Secondary | ICD-10-CM

## 2019-04-19 DIAGNOSIS — M25512 Pain in left shoulder: Secondary | ICD-10-CM

## 2019-04-19 DIAGNOSIS — M25511 Pain in right shoulder: Secondary | ICD-10-CM | POA: Diagnosis not present

## 2019-04-20 ENCOUNTER — Encounter: Payer: Self-pay | Admitting: Orthopedic Surgery

## 2019-04-20 DIAGNOSIS — M25512 Pain in left shoulder: Secondary | ICD-10-CM

## 2019-04-20 DIAGNOSIS — M19012 Primary osteoarthritis, left shoulder: Secondary | ICD-10-CM

## 2019-04-20 MED ORDER — LIDOCAINE HCL 1 % IJ SOLN
5.0000 mL | INTRAMUSCULAR | Status: AC | PRN
  Administered 2019-04-20: 5 mL

## 2019-04-20 MED ORDER — METHYLPREDNISOLONE ACETATE 40 MG/ML IJ SUSP
40.0000 mg | INTRAMUSCULAR | Status: AC | PRN
  Administered 2019-04-20: 40 mg via INTRA_ARTICULAR

## 2019-04-20 MED ORDER — BUPIVACAINE HCL 0.5 % IJ SOLN
9.0000 mL | INTRAMUSCULAR | Status: AC | PRN
  Administered 2019-04-20: 9 mL via INTRA_ARTICULAR

## 2019-04-20 NOTE — Progress Notes (Signed)
Office Visit Note   Patient: Chad Maddox           Date of Birth: Jan 06, 1956           MRN: [id number] Visit Date: 04/19/2019 Requested by: No referring provider defined for this encounter. PCP: System, Pcp Not In  Subjective: Chief Complaint  Patient presents with   Right Shoulder - Pain   Left Shoulder - Pain    HPI: Chad Maddox is a patient with bilateral shoulder pain left worse than right.  Injured his right shoulder 2 years ago trying to pull seen out of a car.  It sounds like he saw a physician elsewhere who did a surgery on him but told him that he could not fix any of the rotator cuff problems and he was sutured back up.  His right shoulder is currently manageable.  He does also describes left shoulder pain.  Injured it 3 weeks ago.  He is trying to get by until the next 2 weeks until he retires.  He works as a Production assistant, radio.  The pain will wake him from sleep at night.  He does have hemoglobin A1c of nine 1 month ago.  His teeth have no issues.  Unable to do MRI scan due to lumbar spine issues.  Majority of his pain is in the left shoulder.  He has been told that he needs shoulder replacement.              ROS: All systems reviewed are negative as they relate to the chief complaint within the history of present illness.  Patient denies  fevers or chills.   Assessment & Plan: Visit Diagnoses:  1. Bilateral shoulder pain, unspecified chronicity   2. Bilateral shoulder region arthritis     Plan: Impression is bilateral rotator cuff arthropathy in a patient who has significant arthritis and rotator cuff problems as well as functional limitations in both shoulders.  The left one is more painful than the right but both have limited forward flexion and abduction both below 90 degrees.  Plan is injection into the left shoulder with close monitoring of glucose.  CT scan left shoulder preop reverse shoulder replacement thin cut for patient specific instrumentation.  I did tell him he needs to  get his A1c below 9 prior to any type of surgical intervention.  We will see him back after the CT scan.  Follow-Up Instructions: No follow-ups on file.   Orders:  Orders Placed This Encounter  Procedures   XR Shoulder Right   XR Shoulder Left   CT SHOULDER LEFT WO CONTRAST   No orders of the defined types were placed in this encounter.     Procedures: Large Joint Inj: L glenohumeral on 04/20/2019 2:30 PM Indications: diagnostic evaluation and pain Details: 18 G 1.5 in needle, posterior approach  Arthrogram: No  Medications: 9 mL bupivacaine 0.5 %; 40 mg methylPREDNISolone acetate 40 MG/ML; 5 mL lidocaine 1 % Outcome: tolerated well, no immediate complications Procedure, treatment alternatives, risks and benefits explained, specific risks discussed. Consent was given by the patient. Immediately prior to procedure a time out was called to verify the correct patient, procedure, equipment, support staff and site/side marked as required. Patient was prepped and draped in the usual sterile fashion.       Clinical Data: No additional findings.  Objective: Vital Signs: There were no vitals taken for this visit.  Physical Exam:   Constitutional: Patient appears well-developed HEENT:  Head: Normocephalic Eyes:EOM are normal Neck:  Normal range of motion Cardiovascular: Normal rate Pulmonary/chest: Effort normal Neurologic: Patient is alert Skin: Skin is warm Psychiatric: Patient has normal mood and affect    Ortho Exam: Ortho exam demonstrates full active and passive range of motion of the cervical spine.  Forward flexion abduction actively is below 90 degrees in both shoulders.  Little bit worse on the left than the right.  Does have weakness to infraspinatus supraspinatus testing bilaterally.  Subscap strength pretty reasonable in both arms.  No masses lymphadenopathy or skin changes noted in the shoulder girdle region.  Deltoid is functional bilaterally.  Passive range  of motion is maintained above 90 degrees of forward flexion and abduction.  Specialty Comments:  No specialty comments available.  Imaging: No results found.   PMFS History: Patient Active Problem List   Diagnosis Date Noted   Hyperlipidemia 04/19/2016   HYPERTENSION, BENIGN 07/12/2010   CAD, NATIVE VESSEL 06/23/2009   Diabetes mellitus type 2 in obese ([facility]CC) 10/28/2008   OBESITY 10/28/2008   LEUKOCYTOSIS 10/28/2008   GERD 10/28/2008   Past Medical History:  Diagnosis Date   Coronary artery disease    s/p NSTEMI 01/2008  -- s/p BMS to RCA 01/2008  --  s/p XIENCE DES TO OM2 12/2008  --  OTW NONOBS DZS AND PATENT RCA STENT, EF 60%    Diabetes mellitus    GERD (gastroesophageal reflux disease)    Hyperlipidemia, mixed    Hypertension    Obesity     Family History  Problem Relation Age of Onset   Cerebral aneurysm Mother    Diabetes Mother    COPD Father    Bone cancer Father    Lung cancer Father    Lymphoma Sister     Past Surgical History:  Procedure Laterality Date   CARDIAC CATHETERIZATION  12/22/2008    Triple-vessel coronary artery disease -- Patent stent in the proximal right coronary artery -- Severe stenosis of the second obtuse marginal branch -- Successful percutaneous coronary intervention with placement of a Xience drug-eluting stent in the second obtuse marginal branch.  -- Normal left ventricular systolic function   Social History   Occupational History   Not on file  Tobacco Use   Smoking status: Never Smoker   Smokeless tobacco: Never Used  Substance and Sexual Activity   Alcohol use: No   Drug use: No   Sexual activity: Not on file

## 2019-04-29 ENCOUNTER — Other Ambulatory Visit: Payer: Medicare Other

## 2019-05-03 ENCOUNTER — Ambulatory Visit: Payer: Medicare Other | Admitting: Orthopedic Surgery

## 2019-05-04 ENCOUNTER — Ambulatory Visit
Admission: RE | Admit: 2019-05-04 | Discharge: 2019-05-04 | Disposition: A | Discharge status: Home / Self Care | Payer: Medicare Other | Source: Ambulatory Visit | Attending: Orthopedic Surgery | Admitting: Orthopedic Surgery

## 2019-05-04 ENCOUNTER — Other Ambulatory Visit: Payer: Self-pay

## 2019-05-04 DIAGNOSIS — M25511 Pain in right shoulder: Secondary | ICD-10-CM

## 2019-05-04 DIAGNOSIS — M25512 Pain in left shoulder: Secondary | ICD-10-CM

## 2019-05-05 ENCOUNTER — Ambulatory Visit (INDEPENDENT_AMBULATORY_CARE_PROVIDER_SITE_OTHER): Payer: Medicare Other | Admitting: Orthopedic Surgery

## 2019-05-05 ENCOUNTER — Encounter: Payer: Self-pay | Admitting: Orthopedic Surgery

## 2019-05-05 VITALS — Ht 70.0 in | Wt 220.0 lb

## 2019-05-05 DIAGNOSIS — M12812 Other specific arthropathies, not elsewhere classified, left shoulder: Secondary | ICD-10-CM

## 2019-05-05 MED ORDER — TRAMADOL HCL 50 MG PO TABS: 50.0000 mg | ORAL_TABLET | Freq: Three times a day (TID) | ORAL | 0 refills | Status: DC | PRN

## 2019-05-06 ENCOUNTER — Telehealth: Payer: Self-pay | Admitting: Orthopedic Surgery

## 2019-05-06 ENCOUNTER — Telehealth: Payer: Self-pay | Admitting: Surgical

## 2019-05-06 NOTE — Telephone Encounter (Signed)
Called into his [facility] and called [facility] to cancel Rx

## 2019-05-06 NOTE — Telephone Encounter (Signed)
Patient's wife called to request the RX for the Tramadol be sent to the [facility] on [address] in [place].  CB#[phone].  Thank you.

## 2019-05-06 NOTE — Telephone Encounter (Signed)
Received call from Truman Hayward from [employer] needing Diagnosis  code for Rx (Tramadol) The number to contact Truman Hayward is [phone]

## 2019-05-08 ENCOUNTER — Encounter: Payer: Self-pay | Admitting: Orthopedic Surgery

## 2019-05-08 NOTE — Progress Notes (Signed)
Office Visit Note   Patient: Chad Maddox           Date of Birth: [birth year]           MRN: [id number] Visit Date: 05/05/2019 Requested by: No referring provider defined for this encounter. PCP: System, Pcp Not In  Subjective: Chief Complaint  Patient presents with  . Left Shoulder - Follow-up    CT Left Shoulder Review    HPI: Chad Maddox is a patient with left shoulder pain.  Since have seen was had a CT scan which shows arthritis consistent with his diagnosis.  He states his blood glucose has been going up because he has been forced to drink to deal with the pain.  Reports pain all the time as well as pain radiating into the arm.  Denies any neck pain.  Hemoglobin A1c was 9.  Had an injection several weeks ago which did not help.              ROS: All systems reviewed are negative as they relate to the chief complaint within the history of present illness.  Patient denies  fevers or chills.   Assessment & Plan: Visit Diagnoses:  1. Rotator cuff arthropathy of left shoulder     Plan: Impression is left shoulder pain and severe arthritis with no real help with nonoperative measures.  I think Chad Maddox is heading for reverse shoulder replacement.  Hemoglobin A1c is elevated and that will need to be trending down before surgery.  Ultram prescribed.  Weakness in the scan to get patient specific guide constructed and plan for surgery likely within 2 to 3 months if his hemoglobin A1c can go lower.  Risk benefits of surgery are discussed including not limited to infection nerve vessel damage dislocation shoulder stiffness as well as potential need for revision surgery if possible.  Patient understands the risk and benefits as well as the activity and weightlifting restrictions which would be present with that type of implant.  Follow-Up Instructions: No follow-ups on file.   Orders:  No orders of the defined types were placed in this encounter.  Meds ordered this encounter  Medications  .  traMADol (ULTRAM) 50 MG tablet    Sig: Take 1 tablet (50 mg total) by mouth every 8 (eight) hours as needed.    Dispense:  42 tablet    Refill:  0      Procedures: No procedures performed   Clinical Data: No additional findings.  Objective: Vital Signs: Ht 5\' 10"  (1.778 m)   Wt 220 lb (99.8 kg)   BMI 31.57 kg/m   Physical Exam:   Constitutional: Patient appears well-developed HEENT:  Head: Normocephalic Eyes:EOM are normal Neck: Normal range of motion Cardiovascular: Normal rate Pulmonary/chest: Effort normal Neurologic: Patient is alert Skin: Skin is warm Psychiatric: Patient has normal mood and affect    Ortho Exam: Ortho exam demonstrates full active and passive range of motion of the elbow and wrist.  He has limited and painful range of motion of that left elbow but the axillary nerve and deltoid is functional.  Has some sores and scrapes on both arms which are in multiple stages of healing.  Motor sensory function left hand is intact.  Cervical spine range of motion is good.  Specialty Comments:  No specialty comments available.  Imaging: No results found.   PMFS History: Patient Active Problem List   Diagnosis Date Noted  . Hyperlipidemia 04/19/2016  . HYPERTENSION, BENIGN 07/12/2010  . CAD, NATIVE  VESSEL 06/23/2009  . Diabetes mellitus type 2 in obese ([place]) 10/28/2008  . OBESITY 10/28/2008  . LEUKOCYTOSIS 10/28/2008  . GERD 10/28/2008   Past Medical History:  Diagnosis Date  . Coronary artery disease    s/p NSTEMI 01/2008  -- s/p BMS to RCA 01/2008  --  s/p XIENCE DES TO OM2 12/2008  --  OTW NONOBS DZS AND PATENT RCA STENT, EF 60%   . Diabetes mellitus   . GERD (gastroesophageal reflux disease)   . Hyperlipidemia, mixed   . Hypertension   . Obesity     Family History  Problem Relation Age of Onset  . Cerebral aneurysm Mother   . Diabetes Mother   . COPD Father   . Bone cancer Father   . Lung cancer Father   . Lymphoma Sister     Past  Surgical History:  Procedure Laterality Date  . CARDIAC CATHETERIZATION  12/22/2008    Triple-vessel coronary artery disease -- Patent stent in the proximal right coronary artery -- Severe stenosis of the second obtuse marginal branch -- Successful percutaneous coronary intervention with placement of a Xience drug-eluting stent in the second obtuse marginal branch.  -- Normal left ventricular systolic function   Social History   Occupational History  . Not on file  Tobacco Use  . Smoking status: Never Smoker  . Smokeless tobacco: Never Used  Substance and Sexual Activity  . Alcohol use: No  . Drug use: No  . Sexual activity: Not on file

## 2019-08-18 ENCOUNTER — Encounter: Payer: Self-pay | Admitting: Orthopedic Surgery

## 2019-08-18 ENCOUNTER — Ambulatory Visit: Payer: Medicare Other | Admitting: Orthopedic Surgery

## 2019-08-18 ENCOUNTER — Other Ambulatory Visit: Payer: Self-pay

## 2019-08-18 DIAGNOSIS — M12812 Other specific arthropathies, not elsewhere classified, left shoulder: Secondary | ICD-10-CM | POA: Diagnosis not present

## 2019-08-18 DIAGNOSIS — E669 Obesity, unspecified: Secondary | ICD-10-CM | POA: Diagnosis not present

## 2019-08-18 DIAGNOSIS — E1169 Type 2 diabetes mellitus with other specified complication: Secondary | ICD-10-CM | POA: Diagnosis not present

## 2019-08-19 LAB — HEMOGLOBIN A1C
Hgb A1c MFr Bld: 11.7 % of total Hgb — ABNORMAL HIGH (ref <5.7)
Mean Plasma Glucose: 289 (calc)
eAG (mmol/L): 16 (calc)

## 2019-08-19 NOTE — Progress Notes (Signed)
Hemoglobin A1c 11.7.  Needs to be closer to 8 to have elective shoulder surgery done.

## 2019-08-22 ENCOUNTER — Encounter: Payer: Self-pay | Admitting: Orthopedic Surgery

## 2019-08-22 NOTE — Progress Notes (Signed)
Office Visit Note   Patient: Destine Ambroise           Date of Birth: 1955-08-27           MRN: [id number] Visit Date: 08/18/2019 Requested by: No referring provider defined for this encounter. PCP: System, Pcp Not In  Subjective: Chief Complaint  Patient presents with  . Left Shoulder - Pain    HPI: Tabor Denham is a 64 y.o. male who presents to the office complaining of left shoulder pain.  He has a history of severe left shoulder rotator cuff arthropathy.  He has been seen in the past and would benefit from shoulder replacement.  However patient has a history of diabetes and his last A1c was above 9.  He has been working to take care of his blood glucose level.  He notes that he has been eating salads among other foods to try and live a more healthy lifestyle.  However due to the pain he has had to drink 2 to 3 ounces of whiskey often for pain control in order to sleep at night.  He was having difficulty with a foot wound but notes that this is healing well and he has been cleared by his podiatrist.  He localizes the majority of his left shoulder pain to the anterior, posterior aspects of the shoulder with radiation to the bicep.  He has been taking tramadol which has not helped and has taken oxycodone 10 mg which has helped with his pain.  Pain wakes him up at night.  He presents today for recheck of A1c level.  He has had difficulty getting appointment with his primary care physician..                ROS:  All systems reviewed are negative as they relate to the chief complaint within the history of present illness.  Patient denies fevers or chills.  Assessment & Plan: Visit Diagnoses:  1. Diabetes mellitus type 2 in obese (HCC)   2. Rotator cuff arthropathy of left shoulder     Plan: Patient is a 64 year old male who presents complaining of left shoulder pain.  Has a history of severe rotator cuff arthropathy.  He has daily pain that wakes him up at night and makes life generally  miserable.  He would be a good candidate for shoulder placement but he has had difficulty with controlling his blood glucose level.  His last A1c was greater than 9.  We will check his A1c today and if it is below 8 we may proceed with shoulder placement.   However, his A1c resulted to be 11.7.  He will have to continue to work to control his diabetes before we can press forward with shoulder arthroplasty.   Follow-Up Instructions: No follow-ups on file.   Orders:  Orders Placed This Encounter  Procedures  . HgB A1c   No orders of the defined types were placed in this encounter.     Procedures: No procedures performed   Clinical Data: No additional findings.  Objective: Vital Signs: There were no vitals taken for this visit.  Physical Exam:  Constitutional: Patient appears well-developed HEENT:  Head: Normocephalic Eyes:EOM are normal Neck: Normal range of motion Cardiovascular: Normal rate Pulmonary/chest: Effort normal Neurologic: Patient is alert Skin: Skin is warm Psychiatric: Patient has normal mood and affect  Ortho Exam:  Left shoulder Exam Significant pain with passive range of motion of the left shoulder.   Axillary nerve is functioning and deltoid fires.  Bicep is functioning as well.  Limited active and passive range of motion of the left shoulder.  Specialty Comments:  No specialty comments available.  Imaging: No results found.   PMFS History: Patient Active Problem List   Diagnosis Date Noted  . Hyperlipidemia 04/19/2016  . HYPERTENSION, BENIGN 07/12/2010  . CAD, NATIVE VESSEL 06/23/2009  . Diabetes mellitus type 2 in obese ([place]) 10/28/2008  . OBESITY 10/28/2008  . LEUKOCYTOSIS 10/28/2008  . GERD 10/28/2008   Past Medical History:  Diagnosis Date  . Coronary artery disease    s/p NSTEMI 01/2008  -- s/p BMS to RCA 01/2008  --  s/p XIENCE DES TO OM2 12/2008  --  OTW NONOBS DZS AND PATENT RCA STENT, EF 60%   . Diabetes mellitus   . GERD  (gastroesophageal reflux disease)   . Hyperlipidemia, mixed   . Hypertension   . Obesity     Family History  Problem Relation Age of Onset  . Cerebral aneurysm Mother   . Diabetes Mother   . COPD Father   . Bone cancer Father   . Lung cancer Father   . Lymphoma Sister     Past Surgical History:  Procedure Laterality Date  . CARDIAC CATHETERIZATION  12/22/2008    Triple-vessel coronary artery disease -- Patent stent in the proximal right coronary artery -- Severe stenosis of the second obtuse marginal branch -- Successful percutaneous coronary intervention with placement of a Xience drug-eluting stent in the second obtuse marginal branch.  -- Normal left ventricular systolic function   Social History   Occupational History  . Not on file  Tobacco Use  . Smoking status: Never Smoker  . Smokeless tobacco: Never Used  Substance and Sexual Activity  . Alcohol use: No  . Drug use: No  . Sexual activity: Not on file

## 2019-11-29 ENCOUNTER — Ambulatory Visit (INDEPENDENT_AMBULATORY_CARE_PROVIDER_SITE_OTHER): Payer: Medicare Other | Admitting: Surgical

## 2019-11-29 ENCOUNTER — Other Ambulatory Visit: Payer: Self-pay

## 2019-11-29 ENCOUNTER — Encounter: Payer: Self-pay | Admitting: Surgical

## 2019-11-29 ENCOUNTER — Telehealth: Payer: Self-pay

## 2019-11-29 ENCOUNTER — Ambulatory Visit: Payer: Self-pay

## 2019-11-29 DIAGNOSIS — L97512 Non-pressure chronic ulcer of other part of right foot with fat layer exposed: Secondary | ICD-10-CM | POA: Diagnosis not present

## 2019-11-29 DIAGNOSIS — E1169 Type 2 diabetes mellitus with other specified complication: Secondary | ICD-10-CM

## 2019-11-29 DIAGNOSIS — M79671 Pain in right foot: Secondary | ICD-10-CM | POA: Diagnosis not present

## 2019-11-29 DIAGNOSIS — E669 Obesity, unspecified: Secondary | ICD-10-CM | POA: Diagnosis not present

## 2019-11-29 NOTE — Progress Notes (Signed)
Office Visit Note   Patient: Chad Maddox           Date of Birth: 1955-09-02           MRN: [id number] Visit Date: 11/29/2019 Requested by: No referring provider defined for this encounter. PCP: System, Pcp Not In  Subjective: Chief Complaint  Patient presents with  . Right Foot - Pain, Wound Check    HPI: Chad Maddox is a 64 y.o. male who presents to the office complaining of right foot pain.  Patient notes that he stepped on a screw in September of last year causing an ulcer over the plantar aspect of the medial forefoot, just under the sesamoid bones of the great toe.  He has been seeing a podiatrist about 3 times a month since then.  According to the patient, the podiatrist has been debriding the wound.  It initially healed up before recurring in the last month.  He denies any fevers or chills.  He does note some drainage.  He has not been taking any antibiotics.  He has severe rotator cuff arthropathy of the left shoulder for which he is pursuing shoulder replacement.  His last A1c was 11.7 however.  He has been using alcohol to deal with the pain from his shoulder at times..                ROS:  All systems reviewed are negative as they relate to the chief complaint within the history of present illness.  Patient denies fevers or chills.  Assessment & Plan: Visit Diagnoses:  1. Pain in right foot   2. Diabetes mellitus type 2 in obese (HCC)   3. 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  He has had an ulcer that has been healing and recurring since September of last year when he stepped on a screw.  Ordered right foot radiographs which do not show any compelling signs of osteomyelitis.  Discussed this patient's case with Dr. Lajoyce Corners who recommended a right foot MRI scan based on the longstanding nature of the ulcer as well as the depth of the  ulcer.  He does have sensation to the right foot but it is diminished.  He has a 1+ DP pulse of the right foot.  Hemoglobin A1c was checked about 3 months ago and was 11.7.  This will be rechecked today.  Hold off on any shoulder surgery until his A1c is below 8 and this foot ulcer is healed.  Follow-up with Dr. Lajoyce Corners to review right foot MRI scan.  Follow-Up Instructions: No follow-ups on file.   Orders:  Orders Placed This Encounter  Procedures  . XR Foot Complete Right  . MR Foot Right w/o contrast  . HgB A1c   No orders of the defined types were placed in this encounter.     Procedures: No procedures performed   Clinical Data: No additional findings.  Objective: Vital Signs: There were no vitals taken for this visit.  Physical Exam:  Constitutional: Patient appears well-developed HEENT:  Head: Normocephalic Eyes:EOM are normal Neck: Normal range of motion Cardiovascular: Normal rate Pulmonary/chest: Effort normal Neurologic: Patient is alert Skin: Skin is warm Psychiatric: Patient has normal mood and affect  Ortho Exam:  Right foot ulcer located on the plantar aspect of the medial forefoot.  No expressible drainage.  No significant surrounding  erythema.  Central tract that tracks down several millimeters.  Mild tenderness to palpation when probing with a Q-tip.  No significant tenderness to palpation over the overlying skin.  1+ DP pulse of the right foot.  Sensation intact but diminished throughout the right foot.  No palpable PT pulse.  Specialty Comments:  No specialty comments available.  Imaging: No results found.   PMFS History: Patient Active Problem List   Diagnosis Date Noted  . Hyperlipidemia 04/19/2016  . HYPERTENSION, BENIGN 07/12/2010  . CAD, NATIVE VESSEL 06/23/2009  . Diabetes mellitus type 2 in obese ([place]) 10/28/2008  . OBESITY 10/28/2008  . LEUKOCYTOSIS 10/28/2008  . GERD 10/28/2008   Past Medical History:  Diagnosis Date  . Coronary  artery disease    s/p NSTEMI 01/2008  -- s/p BMS to RCA 01/2008  --  s/p XIENCE DES TO OM2 12/2008  --  OTW NONOBS DZS AND PATENT RCA STENT, EF 60%   . Diabetes mellitus   . GERD (gastroesophageal reflux disease)   . Hyperlipidemia, mixed   . Hypertension   . Obesity     Family History  Problem Relation Age of Onset  . Cerebral aneurysm Mother   . Diabetes Mother   . COPD Father   . Bone cancer Father   . Lung cancer Father   . Lymphoma Sister     Past Surgical History:  Procedure Laterality Date  . CARDIAC CATHETERIZATION  12/22/2008    Triple-vessel coronary artery disease -- Patent stent in the proximal right coronary artery -- Severe stenosis of the second obtuse marginal branch -- Successful percutaneous coronary intervention with placement of a Xience drug-eluting stent in the second obtuse marginal branch.  -- Normal left ventricular systolic function   Social History   Occupational History  . Not on file  Tobacco Use  . Smoking status: Never Smoker  . Smokeless tobacco: Never Used  Substance and Sexual Activity  . Alcohol use: No  . Drug use: No  . Sexual activity: Not on file

## 2019-11-29 NOTE — Telephone Encounter (Signed)
Chad Maddox just advised he would like patient to follow up with Dr Lajoyce Corners after MRI scan of foot.

## 2019-11-30 LAB — HEMOGLOBIN A1C
Hgb A1c MFr Bld: 11 % of total Hgb — ABNORMAL HIGH (ref <5.7)
Mean Plasma Glucose: 269 (calc)
eAG (mmol/L): 14.9 (calc)

## 2019-11-30 NOTE — Telephone Encounter (Signed)
Noted and placed note in referral

## 2019-12-01 ENCOUNTER — Ambulatory Visit
Admission: RE | Admit: 2019-12-01 | Discharge: 2019-12-01 | Disposition: A | Discharge status: Home / Self Care | Payer: Medicare Other | Source: Ambulatory Visit | Attending: Surgical | Admitting: Surgical

## 2019-12-01 ENCOUNTER — Other Ambulatory Visit: Payer: Self-pay

## 2019-12-01 DIAGNOSIS — M79671 Pain in right foot: Secondary | ICD-10-CM

## 2019-12-09 ENCOUNTER — Ambulatory Visit: Payer: Medicare Other | Admitting: Orthopedic Surgery

## 2019-12-15 ENCOUNTER — Emergency Department (HOSPITAL_COMMUNITY): Payer: Medicare Other

## 2019-12-15 ENCOUNTER — Other Ambulatory Visit: Payer: Self-pay

## 2019-12-15 ENCOUNTER — Ambulatory Visit: Payer: Medicare Other | Admitting: Orthopedic Surgery

## 2019-12-15 ENCOUNTER — Inpatient Hospital Stay (HOSPITAL_COMMUNITY)
Admission: EM | Admit: 2019-12-15 | Discharge: 2019-12-18 | DRG: 872 | Disposition: A | Discharge status: Home Health | Payer: Medicare Other | Attending: Internal Medicine | Admitting: Internal Medicine

## 2019-12-15 ENCOUNTER — Encounter (HOSPITAL_COMMUNITY): Payer: Self-pay

## 2019-12-15 ENCOUNTER — Encounter: Payer: Self-pay | Admitting: Orthopedic Surgery

## 2019-12-15 VITALS — Temp 100.6°F | Ht 70.0 in | Wt 220.0 lb

## 2019-12-15 DIAGNOSIS — L97519 Non-pressure chronic ulcer of other part of right foot with unspecified severity: Secondary | ICD-10-CM | POA: Diagnosis present

## 2019-12-15 DIAGNOSIS — E1169 Type 2 diabetes mellitus with other specified complication: Secondary | ICD-10-CM | POA: Diagnosis not present

## 2019-12-15 DIAGNOSIS — Z880 Allergy status to penicillin: Secondary | ICD-10-CM | POA: Diagnosis not present

## 2019-12-15 DIAGNOSIS — K219 Gastro-esophageal reflux disease without esophagitis: Secondary | ICD-10-CM | POA: Diagnosis present

## 2019-12-15 DIAGNOSIS — Z833 Family history of diabetes mellitus: Secondary | ICD-10-CM | POA: Diagnosis not present

## 2019-12-15 DIAGNOSIS — I493 Ventricular premature depolarization: Secondary | ICD-10-CM | POA: Diagnosis present

## 2019-12-15 DIAGNOSIS — I10 Essential (primary) hypertension: Secondary | ICD-10-CM | POA: Diagnosis present

## 2019-12-15 DIAGNOSIS — M12812 Other specific arthropathies, not elsewhere classified, left shoulder: Secondary | ICD-10-CM | POA: Diagnosis not present

## 2019-12-15 DIAGNOSIS — Z20822 Contact with and (suspected) exposure to covid-19: Secondary | ICD-10-CM | POA: Diagnosis present

## 2019-12-15 DIAGNOSIS — L03115 Cellulitis of right lower limb: Secondary | ICD-10-CM | POA: Diagnosis present

## 2019-12-15 DIAGNOSIS — A419 Sepsis, unspecified organism: Secondary | ICD-10-CM | POA: Diagnosis present

## 2019-12-15 DIAGNOSIS — L97512 Non-pressure chronic ulcer of other part of right foot with fat layer exposed: Secondary | ICD-10-CM | POA: Diagnosis not present

## 2019-12-15 DIAGNOSIS — Z955 Presence of coronary angioplasty implant and graft: Secondary | ICD-10-CM

## 2019-12-15 DIAGNOSIS — Z825 Family history of asthma and other chronic lower respiratory diseases: Secondary | ICD-10-CM | POA: Diagnosis not present

## 2019-12-15 DIAGNOSIS — I4891 Unspecified atrial fibrillation: Secondary | ICD-10-CM | POA: Diagnosis not present

## 2019-12-15 DIAGNOSIS — R509 Fever, unspecified: Secondary | ICD-10-CM

## 2019-12-15 DIAGNOSIS — B999 Unspecified infectious disease: Secondary | ICD-10-CM

## 2019-12-15 DIAGNOSIS — Z79899 Other long term (current) drug therapy: Secondary | ICD-10-CM

## 2019-12-15 DIAGNOSIS — I491 Atrial premature depolarization: Secondary | ICD-10-CM | POA: Diagnosis not present

## 2019-12-15 DIAGNOSIS — B954 Other streptococcus as the cause of diseases classified elsewhere: Secondary | ICD-10-CM | POA: Diagnosis present

## 2019-12-15 DIAGNOSIS — K429 Umbilical hernia without obstruction or gangrene: Secondary | ICD-10-CM | POA: Diagnosis present

## 2019-12-15 DIAGNOSIS — Z91013 Allergy to seafood: Secondary | ICD-10-CM | POA: Diagnosis not present

## 2019-12-15 DIAGNOSIS — E669 Obesity, unspecified: Secondary | ICD-10-CM | POA: Diagnosis present

## 2019-12-15 DIAGNOSIS — Z801 Family history of malignant neoplasm of trachea, bronchus and lung: Secondary | ICD-10-CM | POA: Diagnosis not present

## 2019-12-15 DIAGNOSIS — Z807 Family history of other malignant neoplasms of lymphoid, hematopoietic and related tissues: Secondary | ICD-10-CM | POA: Diagnosis not present

## 2019-12-15 DIAGNOSIS — Z7984 Long term (current) use of oral hypoglycemic drugs: Secondary | ICD-10-CM

## 2019-12-15 DIAGNOSIS — G8929 Other chronic pain: Secondary | ICD-10-CM | POA: Diagnosis present

## 2019-12-15 DIAGNOSIS — Z7982 Long term (current) use of aspirin: Secondary | ICD-10-CM

## 2019-12-15 DIAGNOSIS — E1165 Type 2 diabetes mellitus with hyperglycemia: Secondary | ICD-10-CM | POA: Diagnosis present

## 2019-12-15 DIAGNOSIS — I440 Atrioventricular block, first degree: Secondary | ICD-10-CM | POA: Diagnosis present

## 2019-12-15 DIAGNOSIS — E11621 Type 2 diabetes mellitus with foot ulcer: Secondary | ICD-10-CM | POA: Diagnosis present

## 2019-12-15 DIAGNOSIS — E782 Mixed hyperlipidemia: Secondary | ICD-10-CM | POA: Diagnosis present

## 2019-12-15 DIAGNOSIS — Z9114 Patient's other noncompliance with medication regimen: Secondary | ICD-10-CM

## 2019-12-15 DIAGNOSIS — I2510 Atherosclerotic heart disease of native coronary artery without angina pectoris: Secondary | ICD-10-CM | POA: Diagnosis present

## 2019-12-15 DIAGNOSIS — I252 Old myocardial infarction: Secondary | ICD-10-CM

## 2019-12-15 LAB — URINALYSIS, ROUTINE W REFLEX MICROSCOPIC
Bacteria, UA: NONE SEEN
Bilirubin Urine: NEGATIVE
Glucose, UA: >=500 mg/dL — AB
Hgb urine dipstick: NEGATIVE
Ketones, ur: 20 mg/dL — AB
Leukocytes,Ua: NEGATIVE
Nitrite: NEGATIVE
Protein, ur: NEGATIVE mg/dL
Specific Gravity, Urine: 1.027 (ref 1.005–1.030)
pH: 5 (ref 5.0–8.0)

## 2019-12-15 LAB — COMPREHENSIVE METABOLIC PANEL
ALT: 22 U/L (ref 0–44)
AST: 19 U/L (ref 15–41)
Albumin: 3.9 g/dL (ref 3.5–5.0)
Alkaline Phosphatase: 70 U/L (ref 38–126)
Anion gap: 14 (ref 5–15)
BUN: 25 mg/dL — ABNORMAL HIGH (ref 8–23)
CO2: 23 mmol/L (ref 22–32)
Calcium: 9.5 mg/dL (ref 8.9–10.3)
Chloride: 95 mmol/L — ABNORMAL LOW (ref 98–111)
Creatinine, Ser: 0.98 mg/dL (ref 0.61–1.24)
GFR calc Af Amer: >60 mL/min (ref >60)
GFR calc non Af Amer: >60 mL/min (ref >60)
Glucose, Bld: 294 mg/dL — ABNORMAL HIGH (ref 70–99)
Potassium: 4.6 mmol/L (ref 3.5–5.1)
Sodium: 132 mmol/L — ABNORMAL LOW (ref 135–145)
Total Bilirubin: 1 mg/dL (ref 0.3–1.2)
Total Protein: 6.9 g/dL (ref 6.5–8.1)

## 2019-12-15 LAB — PROTIME-INR
INR: 1 (ref 0.8–1.2)
Prothrombin Time: 12.8 seconds (ref 11.4–15.2)

## 2019-12-15 LAB — CBC WITH DIFFERENTIAL/PLATELET
Abs Immature Granulocytes: 0.15 10*3/uL — ABNORMAL HIGH (ref 0.00–0.07)
Basophils Absolute: 0 10*3/uL (ref 0.0–0.1)
Basophils Relative: 0 %
Eosinophils Absolute: 0 10*3/uL (ref 0.0–0.5)
Eosinophils Relative: 0 %
HCT: 53.1 % — ABNORMAL HIGH (ref 39.0–52.0)
Hemoglobin: 16.8 g/dL (ref 13.0–17.0)
Immature Granulocytes: 1 %
Lymphocytes Relative: 5 %
Lymphs Abs: 0.9 10*3/uL (ref 0.7–4.0)
MCH: 24.5 pg — ABNORMAL LOW (ref 26.0–34.0)
MCHC: 31.6 g/dL (ref 30.0–36.0)
MCV: 77.3 fL — ABNORMAL LOW (ref 80.0–100.0)
Monocytes Absolute: 1.3 10*3/uL — ABNORMAL HIGH (ref 0.1–1.0)
Monocytes Relative: 7 %
Neutro Abs: 16.4 10*3/uL — ABNORMAL HIGH (ref 1.7–7.7)
Neutrophils Relative %: 87 %
Platelets: 212 10*3/uL (ref 150–400)
RBC: 6.87 MIL/uL — ABNORMAL HIGH (ref 4.22–5.81)
RDW: 16.9 % — ABNORMAL HIGH (ref 11.5–15.5)
WBC: 18.8 10*3/uL — ABNORMAL HIGH (ref 4.0–10.5)
nRBC: 0 % (ref 0.0–0.2)

## 2019-12-15 LAB — LACTIC ACID, PLASMA
Lactic Acid, Venous: 2.5 mmol/L (ref 0.5–1.9)
Lactic Acid, Venous: 2.1 mmol/L (ref 0.5–1.9)

## 2019-12-15 LAB — APTT: aPTT: 26 seconds (ref 24–36)

## 2019-12-15 LAB — SARS CORONAVIRUS 2 BY RT PCR (HOSPITAL ORDER, PERFORMED IN ~~LOC~~ HOSPITAL LAB): SARS Coronavirus 2: NEGATIVE

## 2019-12-15 MED ORDER — ASPIRIN 81 MG PO CHEW
81.0000 mg | CHEWABLE_TABLET | Freq: Every day | ORAL | Status: DC
  Administered 2019-12-16 – 2019-12-18 (×3): 81 mg via ORAL
  Filled 2019-12-15 (×3): qty 1

## 2019-12-15 MED ORDER — ROSUVASTATIN CALCIUM 20 MG PO TABS
40.0000 mg | ORAL_TABLET | Freq: Every day | ORAL | Status: DC
  Administered 2019-12-16 – 2019-12-18 (×3): 40 mg via ORAL
  Filled 2019-12-15 (×3): qty 2

## 2019-12-15 MED ORDER — SODIUM CHLORIDE 0.9 % IV BOLUS (SEPSIS)
1000.0000 mL | Freq: Once | INTRAVENOUS | Status: AC
  Administered 2019-12-15: 1000 mL via INTRAVENOUS

## 2019-12-15 MED ORDER — SODIUM CHLORIDE 0.9 % IV SOLN: INTRAVENOUS | Status: AC

## 2019-12-15 MED ORDER — SODIUM CHLORIDE 0.9 % IV SOLN
2.0000 g | Freq: Three times a day (TID) | INTRAVENOUS | Status: DC
  Administered 2019-12-16 – 2019-12-17 (×4): 2 g via INTRAVENOUS
  Filled 2019-12-15 (×5): qty 2

## 2019-12-15 MED ORDER — HEPARIN BOLUS VIA INFUSION
5000.0000 [IU] | Freq: Once | INTRAVENOUS | Status: AC
  Administered 2019-12-16: 5000 [IU] via INTRAVENOUS
  Filled 2019-12-15: qty 5000

## 2019-12-15 MED ORDER — METRONIDAZOLE IN NACL 5-0.79 MG/ML-% IV SOLN
500.0000 mg | Freq: Three times a day (TID) | INTRAVENOUS | Status: DC
  Administered 2019-12-16 – 2019-12-17 (×5): 500 mg via INTRAVENOUS
  Filled 2019-12-15 (×5): qty 100

## 2019-12-15 MED ORDER — ACETAMINOPHEN 500 MG PO TABS
1000.0000 mg | ORAL_TABLET | Freq: Once | ORAL | Status: AC
  Administered 2019-12-15: 1000 mg via ORAL
  Filled 2019-12-15: qty 2

## 2019-12-15 MED ORDER — VANCOMYCIN HCL IN DEXTROSE 1-5 GM/200ML-% IV SOLN: 1000.0000 mg | Freq: Once | INTRAVENOUS | Status: DC

## 2019-12-15 MED ORDER — ONDANSETRON HCL 4 MG/2ML IJ SOLN
4.0000 mg | Freq: Once | INTRAMUSCULAR | Status: AC
  Administered 2019-12-15: 4 mg via INTRAVENOUS
  Filled 2019-12-15: qty 2

## 2019-12-15 MED ORDER — INSULIN GLARGINE 100 UNIT/ML ~~LOC~~ SOLN
10.0000 [IU] | Freq: Every day | SUBCUTANEOUS | Status: DC
  Administered 2019-12-16 – 2019-12-17 (×3): 10 [IU] via SUBCUTANEOUS
  Filled 2019-12-15 (×4): qty 0.1

## 2019-12-15 MED ORDER — ONDANSETRON HCL 4 MG/2ML IJ SOLN: 4.0000 mg | Freq: Four times a day (QID) | INTRAMUSCULAR | Status: DC | PRN

## 2019-12-15 MED ORDER — NITROGLYCERIN 0.4 MG SL SUBL: 0.4000 mg | SUBLINGUAL_TABLET | SUBLINGUAL | Status: DC | PRN

## 2019-12-15 MED ORDER — VANCOMYCIN HCL IN DEXTROSE 1-5 GM/200ML-% IV SOLN
1000.0000 mg | Freq: Once | INTRAVENOUS | Status: DC
  Filled 2019-12-15: qty 200

## 2019-12-15 MED ORDER — INSULIN ASPART 100 UNIT/ML ~~LOC~~ SOLN
0.0000 [IU] | Freq: Three times a day (TID) | SUBCUTANEOUS | Status: DC
  Administered 2019-12-16: 2 [IU] via SUBCUTANEOUS
  Administered 2019-12-16 (×2): 5 [IU] via SUBCUTANEOUS
  Administered 2019-12-17: 3 [IU] via SUBCUTANEOUS
  Administered 2019-12-17 (×2): 2 [IU] via SUBCUTANEOUS
  Administered 2019-12-18: 3 [IU] via SUBCUTANEOUS
  Administered 2019-12-18: 5 [IU] via SUBCUTANEOUS

## 2019-12-15 MED ORDER — ONDANSETRON HCL 4 MG PO TABS: 4.0000 mg | ORAL_TABLET | Freq: Four times a day (QID) | ORAL | Status: DC | PRN

## 2019-12-15 MED ORDER — SODIUM CHLORIDE 0.9 % IV SOLN
2.0000 g | Freq: Once | INTRAVENOUS | Status: AC
  Administered 2019-12-16: 2 g via INTRAVENOUS
  Filled 2019-12-15 (×2): qty 2

## 2019-12-15 MED ORDER — HEPARIN (PORCINE) 25000 UT/250ML-% IV SOLN
1500.0000 [IU]/h | INTRAVENOUS | Status: DC
  Administered 2019-12-16: 1300 [IU]/h via INTRAVENOUS
  Filled 2019-12-15 (×2): qty 250

## 2019-12-15 MED ORDER — LISINOPRIL 20 MG PO TABS
20.0000 mg | ORAL_TABLET | Freq: Every day | ORAL | Status: DC
  Administered 2019-12-16 – 2019-12-18 (×3): 20 mg via ORAL
  Filled 2019-12-15 (×3): qty 1

## 2019-12-15 MED ORDER — VANCOMYCIN HCL IN DEXTROSE 1-5 GM/200ML-% IV SOLN
1000.0000 mg | Freq: Two times a day (BID) | INTRAVENOUS | Status: DC
  Administered 2019-12-16 – 2019-12-17 (×3): 1000 mg via INTRAVENOUS
  Filled 2019-12-15 (×3): qty 200

## 2019-12-15 MED ORDER — VANCOMYCIN HCL 2000 MG/400ML IV SOLN
2000.0000 mg | Freq: Once | INTRAVENOUS | Status: AC
  Administered 2019-12-15: 2000 mg via INTRAVENOUS
  Filled 2019-12-15: qty 400

## 2019-12-15 MED ORDER — LEVOFLOXACIN IN D5W 750 MG/150ML IV SOLN: 750.0000 mg | Freq: Once | INTRAVENOUS | Status: DC

## 2019-12-15 MED ORDER — SODIUM CHLORIDE 0.9 % IV BOLUS
1000.0000 mL | Freq: Once | INTRAVENOUS | Status: AC
  Administered 2019-12-15: 1000 mL via INTRAVENOUS

## 2019-12-15 MED ORDER — SODIUM CHLORIDE 0.9 % IV SOLN
1.0000 g | INTRAVENOUS | Status: DC
  Administered 2019-12-15: 1 g via INTRAVENOUS
  Filled 2019-12-15: qty 10

## 2019-12-15 MED ORDER — METOPROLOL TARTRATE 25 MG PO TABS
25.0000 mg | ORAL_TABLET | Freq: Two times a day (BID) | ORAL | Status: DC
  Administered 2019-12-16 – 2019-12-18 (×6): 25 mg via ORAL
  Filled 2019-12-15 (×6): qty 1

## 2019-12-15 MED ORDER — MORPHINE SULFATE (PF) 4 MG/ML IV SOLN
4.0000 mg | Freq: Once | INTRAVENOUS | Status: AC
  Administered 2019-12-15: 4 mg via INTRAVENOUS
  Filled 2019-12-15: qty 1

## 2019-12-15 NOTE — H&P (Signed)
History and Physical    Colt Martelle WUJ:[id number] DOB: May 03, 1956 DOA: 12/15/2019  PCP: System, Pcp Not In  Patient coming from: Home.  Chief Complaint: Fever chills.  HPI: Chad Maddox is a 64 y.o. 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  Patient has been a ulceration since September 2020.  Last week patient had an MRI done which shows features consistent with cellulitis with no bony involvement.  Today patient was following up with orthopedics and was waiting in the office when patient became febrile tachycardic and had nausea with 1 episode of vomiting.  Denies any abdominal pain or diarrhea.  Given the symptoms patient was brought to the ER.  Patient denies any chest pain or shortness of breath or productive cough.  ED Course: In the ER patient was febrile with temperature 101.7 F and was found to be in A. fib with RVR with chest x-ray shows congestion.  X-ray of the right foot shows soft tissue swelling.  Patient had blood cultures drawn and started on empiric antibiotics for possible sepsis from the right foot cellulitis.  Lab work show lactic acid was 2.5 improving to 2.1 with hydration WBC count of 18.8 blood glucose 294 patient admitted for sepsis likely from right foot cellulitis.  Review of Systems: As per HPI, rest all negative.   Past Medical History:  Diagnosis Date  . Coronary artery disease    s/p NSTEMI 01/2008  -- s/p BMS to RCA 01/2008  --  s/p XIENCE DES TO OM2 12/2008  --  OTW NONOBS DZS AND PATENT RCA STENT, EF 60%   . Diabetes mellitus   . GERD (gastroesophageal reflux disease)   . Hyperlipidemia, mixed   . Hypertension   . Obesity     Past Surgical History:  Procedure Laterality Date  . CARDIAC CATHETERIZATION  12/22/2008    Triple-vessel coronary artery disease -- Patent stent  in the proximal right coronary artery -- Severe stenosis of the second obtuse marginal branch -- Successful percutaneous coronary intervention with placement of a Xience drug-eluting stent in the second obtuse marginal branch.  -- Normal left ventricular systolic function     reports that he has never smoked. He has never used smokeless tobacco. He reports that he does not drink alcohol or use drugs.  Allergies  Allergen Reactions  . Shellfish Allergy Shortness Of Breath and Swelling  . Penicillins Other (See Comments)    Occurred as an infant; reaction unknown.    Family History  Problem Relation Age of Onset  . Cerebral aneurysm Mother   . Diabetes Mother   . COPD Father   . Bone cancer Father   . Lung cancer Father   . Lymphoma Sister     Prior to Admission medications   Medication Sig Start Date End Date Taking? Authorizing Provider  aspirin 81 MG tablet Take 81 mg by mouth daily.     Yes [provider]  canagliflozin (INVOKANA) 100 MG TABS tablet Take 100 mg by mouth daily.   Yes [provider]  glipiZIDE (GLUCOTROL XL) 10 MG 24 hr tablet Take 10 mg by mouth daily.     Yes [provider]  lisinopril (PRINIVIL,ZESTRIL) 20 MG tablet Take 1 tablet (20 mg total) by mouth daily. 04/19/16  Yes [place], Peter M, MD  metFORMIN (GLUCOPHAGE-XR) 500 MG 24  hr tablet Take 1,000 mg by mouth 2 (two) times daily.     Yes [provider]  metoprolol tartrate (LOPRESSOR) 25 MG tablet Take 1 tablet (25 mg total) by mouth 2 (two) times daily. 04/19/16  Yes [place], Peter M, MD  nitroGLYCERIN (NITROSTAT) 0.4 MG SL tablet Place 1 tablet (0.4 mg total) under the tongue every 5 (five) minutes as needed. Patient taking differently: Place 0.4 mg under the tongue every 5 (five) minutes as needed for chest pain.  04/19/16  Yes [place], Peter M, MD  rosuvastatin (CRESTOR) 40 MG tablet TAKE 1 TABLET BY MOUTH  DAILY 02/16/19  Yes [place], Peter M, MD  traMADol (ULTRAM) 50 MG  tablet Take 1 tablet (50 mg total) by mouth every 8 (eight) hours as needed. Patient taking differently: Take 50 mg by mouth every 8 (eight) hours as needed for moderate pain.  05/05/19  Yes Magnant, Joycie Peek, PA-C    Physical Exam: Constitutional: Moderately built and nourished. Vitals:   12/15/19 1745 12/15/19 1830 12/15/19 2002 12/15/19 2136  BP: 137/87 (!) 129/92  (!) 141/101  Pulse: (!) 123 (!) 117  (!) 106  Resp: (!) 22 (!) 23  20  Temp:   99.5 F (37.5 C)   TempSrc:   Oral   SpO2: 95% 94%  95%  Weight:      Height:       Eyes: Anicteric no pallor. ENMT: No discharge from the ears eyes nose or mouth. Neck: No mass felt.  No neck rigidity. Respiratory: No rhonchi or crepitations. Cardiovascular: S1-S2 heard. Abdomen: Soft nontender bowel sounds present.  Umbilical hernia seen nonobstructive. Musculoskeletal: Right foot has an ulceration on the plantar aspect of the foot around the first metatarsal head. Skin: Ulceration of the right foot as explained in the musculoskeletal system.  No active discharge seen. Neurologic: Alert awake oriented to time place and person.  Moves all extremities. Psychiatric: Appears normal.   Labs on Admission: I have personally reviewed following labs and imaging studies  CBC: Recent Labs  Lab 12/15/19 1755  WBC 18.8*  NEUTROABS 16.4*  HGB 16.8  HCT 53.1*  MCV 77.3*  PLT 212   Basic Metabolic Panel: Recent Labs  Lab 12/15/19 1755  NA 132*  K 4.6  CL 95*  CO2 23  GLUCOSE 294*  BUN 25*  CREATININE 0.98  CALCIUM 9.5   GFR: Estimated Creatinine Clearance: 92.3 mL/min (by C-G formula based on SCr of 0.98 mg/dL). Liver Function Tests: Recent Labs  Lab 12/15/19 1755  AST 19  ALT 22  ALKPHOS 70  BILITOT 1.0  PROT 6.9  ALBUMIN 3.9   No results for input(s): LIPASE, AMYLASE in the last 168 hours. No results for input(s): AMMONIA in the last 168 hours. Coagulation Profile: Recent Labs  Lab 12/15/19 1911  INR 1.0    Cardiac Enzymes: No results for input(s): CKTOTAL, CKMB, CKMBINDEX, TROPONINI in the last 168 hours. BNP (last 3 results) No results for input(s): PROBNP in the last 8760 hours. HbA1C: No results for input(s): HGBA1C in the last 72 hours. CBG: No results for input(s): GLUCAP in the last 168 hours. Lipid Profile: No results for input(s): CHOL, HDL, LDLCALC, TRIG, CHOLHDL, LDLDIRECT in the last 72 hours. Thyroid Function Tests: No results for input(s): TSH, T4TOTAL, FREET4, T3FREE, THYROIDAB in the last 72 hours. Anemia Panel: No results for input(s): VITAMINB12, FOLATE, FERRITIN, TIBC, IRON, RETICCTPCT in the last 72 hours. Urine analysis:    Component Value Date/Time  COLORURINE STRAW (A) 12/15/2019 1731   APPEARANCEUR CLEAR 12/15/2019 1731   LABSPEC 1.027 12/15/2019 1731   PHURINE 5.0 12/15/2019 1731   GLUCOSEU >=500 (A) 12/15/2019 1731   HGBUR NEGATIVE 12/15/2019 1731   BILIRUBINUR NEGATIVE 12/15/2019 1731   KETONESUR 20 (A) 12/15/2019 1731   PROTEINUR NEGATIVE 12/15/2019 1731   UROBILINOGEN 0.2 01/18/2008 1450   NITRITE NEGATIVE 12/15/2019 1731   LEUKOCYTESUR NEGATIVE 12/15/2019 1731   Sepsis Labs: @LABRCNTIP (procalcitonin:4,lacticidven:4) ) Recent Results (from the past 240 hour(s))  SARS Coronavirus 2 by RT PCR (hospital order, performed in [facility] hospital lab) Nasopharyngeal Nasopharyngeal Swab     Status: None   Collection Time: 12/15/19  5:31 PM   Specimen: Nasopharyngeal Swab  Result Value Ref Range Status   SARS Coronavirus 2 NEGATIVE NEGATIVE Final    Comment: (NOTE) SARS-CoV-2 target nucleic acids are NOT DETECTED. The SARS-CoV-2 RNA is generally detectable in upper and lower respiratory specimens during the acute phase of infection. The lowest concentration of SARS-CoV-2 viral copies this assay can detect is 250 copies / mL. A negative result does not preclude SARS-CoV-2 infection and should not be used as the sole basis for treatment or  other patient management decisions.  A negative result may occur with improper specimen collection / handling, submission of specimen other than nasopharyngeal swab, presence of viral mutation(s) within the areas targeted by this assay, and inadequate number of viral copies (<250 copies / mL). A negative result must be combined with clinical observations, patient history, and epidemiological information. Fact Sheet for Patients:   12/17/19 Fact Sheet for Healthcare Providers: BoilerBrush.com.cy This test is not yet approved or cleared  by the https://pope.com/ FDA and has been authorized for detection and/or diagnosis of SARS-CoV-2 by FDA under an Emergency Use Authorization (EUA).  This EUA will remain in effect (meaning this test can be used) for the duration of the COVID-19 declaration under Section 564(b)(1) of the Act, 21 U.S.C. section 360bbb-3(b)(1), unless the authorization is terminated or revoked sooner. Performed at [facility], [address] [place], [place]      Radiological Exams on Admission: DG Chest 1 View  Result Date: 12/15/2019 CLINICAL DATA:  Patient with wound infection of the foot. EXAM: CHEST  1 VIEW COMPARISON:  Chest radiograph 01/19/2008. FINDINGS: Monitoring leads overlie the patient. Stable cardiac and mediastinal contours. Pulmonary vascular redistribution. No pleural effusion or pneumothorax. IMPRESSION: Mild cardiomegaly with pulmonary vascular redistribution. Electronically Signed   By: 01/21/2008 M.D.   On: 12/15/2019 18:23   DG Foot Complete Right  Result Date: 12/15/2019 CLINICAL DATA:  Wound infection plantar aspect right first metatarsophalangeal joint, fever, nausea EXAM: RIGHT FOOT COMPLETE - 3+ VIEW COMPARISON:  12/01/2019, 11/29/2019 FINDINGS: Frontal, oblique, lateral views of the right foot are obtained. There is progressive soft tissue swelling plantar aspect first digit  at the level of the metatarsophalangeal joint. Soft tissue ulceration is again noted. There is no underlying bony destruction or periosteal reaction to suggest osteomyelitis. No fracture, subluxation, or dislocation. Joint spaces are well preserved. IMPRESSION: 1. Increasing soft tissue swelling at the site of right first digit soft tissue ulceration. 2. No evidence of osteomyelitis. Electronically Signed   By: 01/29/2020 M.D.   On: 12/15/2019 18:22    EKG: Independently reviewed.  A. fib with RVR.  Confirmed with cardiologist Dr. 12/17/2019.  Assessment/Plan Principal Problem:   Sepsis (HCC) Active Problems:   Diabetes mellitus type 2 in obese (HCC)   HYPERTENSION,  BENIGN   Cellulitis of right foot   Atrial fibrillation with RVR ([place])    1. Sepsis source likely could be from the right foot ulceration with cellulitis for which I have started patient on empiric antibiotics and follow cultures.  Patient's MRI done last week did not show any bone involvement x-ray also does not show any bone involvement at this time.  May consult patient's orthopedics in the morning. 2. A. fib with RVR appears to be new onset.  EKG confirmed with cardiology about A. fib.  Patient's heart rate is improved at this time.  Patient is on beta-blockers for rate control.  Will start patient on heparin for now may change to apixaban if there is no procedure anticipated.  Chads 2 vasc score is at least 2. 3. Diabetes mellitus type 2 uncontrolled with hyperglycemia for which I have started patient on Lantus 10 units with sliding scale coverage.  Closely follow CBGs. 4. Hypertension on lisinopril and metoprolol. 5. History of CAD denies any chest pain.  Will trend cardiac markers given the A. fib.  On aspirin presently on heparin statins and beta-blockers. 6. Umbilical hernia appears nonobstructed.  Closely monitor.  Given the septic picture will need close monitoring for any further deterioration in inpatient status.   DVT  prophylaxis: Heparin infusion. Code Status: Full code. Family Communication: Discussed with patient. Disposition Plan: Home. Consults called: None. Admission status: Inpatient.   Rise Patience MD [facility] Pager [phone].  If 7PM-7AM, please contact night-coverage [email or web address] Password [facility]  12/15/2019, 10:12 PM

## 2019-12-15 NOTE — Progress Notes (Addendum)
ANTICOAGULATION CONSULT NOTE - Initial Consult  Pharmacy Consult for heparin Indication: atrial fibrillation  Allergies  Allergen Reactions  . Shellfish Allergy Shortness Of Breath and Swelling  . Penicillins Other (See Comments)    Occurred as an infant; reaction unknown.    Patient Measurements: Height: 5\' 10"  (177.8 cm) Weight: 102.1 kg (225 lb) IBW/kg (Calculated) : 73 Heparin Dosing Weight: 94.5kg  Vital Signs: Temp: 99.5 F (37.5 C) (05/26 2002) Temp Source: Oral (05/26 2002) BP: 141/101 (05/26 2136) Pulse Rate: 106 (05/26 2136)  Labs: Recent Labs    12/15/19 1755 12/15/19 1911  HGB 16.8  --   HCT 53.1*  --   PLT 212  --   APTT  --  26  LABPROT  --  12.8  INR  --  1.0  CREATININE 0.98  --     Estimated Creatinine Clearance: 92.3 mL/min (by C-G formula based on SCr of 0.98 mg/dL).   Medical History: Past Medical History:  Diagnosis Date  . Coronary artery disease    s/p NSTEMI 01/2008  -- s/p BMS to RCA 01/2008  --  s/p XIENCE DES TO OM2 12/2008  --  OTW NONOBS DZS AND PATENT RCA STENT, EF 60%   . Diabetes mellitus   . GERD (gastroesophageal reflux disease)   . Hyperlipidemia, mixed   . Hypertension   . Obesity     Medications:  Infusions:  . sodium chloride    . ceFEPime (MAXIPIME) IV    . [START ON 12/16/2019] ceFEPime (MAXIPIME) IV    . heparin    . metronidazole    . [START ON 12/16/2019] vancomycin    . vancomycin 2,000 mg (12/15/19 2154)    Assessment: 64 yom presented to the ED with fever. Found to be in afib and now starting anticoagulation with heparin. Baseline Hgb and platelets are WNL. He is not on anticoagulation PTA.   Also changing ceftriaxone to cefepime for sepsis/cellulitis.   Goal of Therapy:  Heparin level 0.3-0.7 units/ml Monitor platelets by anticoagulation protocol: Yes   Plan:  Heparin bolus 5000 units IV x 1 Heparin gtt 1300 units/hr Check a 6 hr heparin level Daily heparin level and CBC Cefepime 2gm IV Q8H F/u  renal fxn, C&S, clinical status   Treyon Wymore, 2155 12/15/2019,10:32 PM

## 2019-12-15 NOTE — ED Notes (Signed)
Pt transported to XRAY °

## 2019-12-15 NOTE — ED Notes (Signed)
This RN dressed this pts wound on the R. Foot

## 2019-12-15 NOTE — ED Provider Notes (Signed)
[facility] EMERGENCY DEPARTMENT Provider Note   CSN: [id number] Arrival date & time: 12/15/19  1717     History No chief complaint on file.   Chad Maddox is a 64 y.o. male present history of CAD, diabetes, GERD, hyperlipidemia, hypertension brought in by EMS for evaluation of fever.  Per EMS, patient went to [facility] orthopedics for evaluation of wound on his right foot.  While waiting, he started feeling feeling bad.  When he was evaluated by Ortho, they noticed he was febrile at 100.6.  Additionally, he was tachycardic, tachypneic, prompting EMS call.  On EMS arrival, he had some nausea and vomiting.  He was also found to be in A. fib.  Patient reports no previous history of A. fib.  He states that he had been feeling fine prior to onset of symptoms.  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  He states that he decided to drive home yesterday because he knew he had this orthopedic appointment today.  He states that he felt fine this morning.  He states that he was seeing orthopedic for a wound that is on the plantar surface of his right foot.  He reports that this is been there for since September 2020 when he stepped on a nail.  He reports he has been following with his primary care doctor regarding wound and states it is not getting any better so he was referred to orthopedics.  He states he has not been on any antibiotics.  He states that he got his Covid vaccines.  No known Covid exposure.  He states he has not had any chest pain, difficulty breathing, abdominal pain, urinary complaints.  He does have a large periumbilical hernia but states that that has been there since fall 2020 and it has not caused any further pain or issues.  The history is provided by the patient.       Past Medical History:  Diagnosis Date  . Coronary artery disease    s/p NSTEMI 01/2008  -- s/p BMS to RCA 01/2008  --  s/p XIENCE DES TO  OM2 12/2008  --  OTW NONOBS DZS AND PATENT RCA STENT, EF 60%   . Diabetes mellitus   . GERD (gastroesophageal reflux disease)   . Hyperlipidemia, mixed   . Hypertension   . Obesity     Patient Active Problem List   Diagnosis Date Noted  . Sepsis (HCC) 12/15/2019  . Cellulitis of right foot 12/15/2019  . Atrial fibrillation with RVR (HCC) 12/15/2019  . Hyperlipidemia 04/19/2016  . HYPERTENSION, BENIGN 07/12/2010  . CAD, NATIVE VESSEL 06/23/2009  . Diabetes mellitus type 2 in obese (HCC) 10/28/2008  . OBESITY 10/28/2008  . LEUKOCYTOSIS 10/28/2008  . GERD 10/28/2008    Past Surgical History:  Procedure Laterality Date  . CARDIAC CATHETERIZATION  12/22/2008    Triple-vessel coronary artery disease -- Patent stent in the proximal right coronary artery -- Severe stenosis of the second obtuse marginal branch -- Successful percutaneous coronary intervention with placement of a Xience drug-eluting stent in the second obtuse marginal branch.  -- Normal left ventricular systolic function       Family History  Problem Relation Age of Onset  . Cerebral aneurysm Mother   . Diabetes Mother   . COPD Father   . Bone cancer Father   . Lung cancer Father   . Lymphoma Sister     Social History   Tobacco  Use  . Smoking status: Never Smoker  . Smokeless tobacco: Never Used  Substance Use Topics  . Alcohol use: No  . Drug use: No    Home Medications Prior to Admission medications   Medication Sig Start Date End Date Taking? Authorizing Provider  aspirin 81 MG tablet Take 81 mg by mouth daily.     Yes [provider]  canagliflozin (INVOKANA) 100 MG TABS tablet Take 100 mg by mouth daily.   Yes [provider]  glipiZIDE (GLUCOTROL XL) 10 MG 24 hr tablet Take 10 mg by mouth daily.     Yes [provider]  lisinopril (PRINIVIL,ZESTRIL) 20 MG tablet Take 1 tablet (20 mg total) by mouth daily. 04/19/16  Yes [place], Peter M, MD  metFORMIN (GLUCOPHAGE-XR) 500 MG  24 hr tablet Take 1,000 mg by mouth 2 (two) times daily.     Yes [provider]  metoprolol tartrate (LOPRESSOR) 25 MG tablet Take 1 tablet (25 mg total) by mouth 2 (two) times daily. 04/19/16  Yes [place], Peter M, MD  nitroGLYCERIN (NITROSTAT) 0.4 MG SL tablet Place 1 tablet (0.4 mg total) under the tongue every 5 (five) minutes as needed. Patient taking differently: Place 0.4 mg under the tongue every 5 (five) minutes as needed for chest pain.  04/19/16  Yes [place], Peter M, MD  rosuvastatin (CRESTOR) 40 MG tablet TAKE 1 TABLET BY MOUTH  DAILY 02/16/19  Yes [place], Peter M, MD  traMADol (ULTRAM) 50 MG tablet Take 1 tablet (50 mg total) by mouth every 8 (eight) hours as needed. Patient taking differently: Take 50 mg by mouth every 8 (eight) hours as needed for moderate pain.  05/05/19  Yes Magnant, Charles L, PA-C    Allergies    Shellfish allergy and Penicillins  Review of Systems   Review of Systems  Constitutional: Positive for fever.  Respiratory: Negative for cough and shortness of breath.   Cardiovascular: Negative for chest pain.  Gastrointestinal: Positive for nausea and vomiting. Negative for abdominal pain.  Genitourinary: Negative for dysuria and hematuria.  Skin: Positive for wound.  Neurological: Negative for headaches.  All other systems reviewed and are negative.   Physical Exam Updated Vital Signs BP (!) 141/101 (BP Location: Right Arm)   Pulse (!) 106   Temp 99.5 F (37.5 C) (Oral)   Resp 20   Ht 5\' 10"  (1.778 m)   Wt 102.1 kg   SpO2 95%   BMI 32.28 kg/m   Physical Exam Vitals and nursing note reviewed.  Constitutional:      Appearance: Normal appearance. He is well-developed.  HENT:     Head: Normocephalic and atraumatic.  Eyes:     General: Lids are normal.     Conjunctiva/sclera: Conjunctivae normal.     Pupils: Pupils are equal, round, and reactive to light.  Cardiovascular:     Rate and Rhythm: Tachycardia present. Rhythm regularly  irregular.     Pulses: Normal pulses.          Dorsalis pedis pulses are 2+ on the right side and 2+ on the left side.     Heart sounds: Normal heart sounds. No murmur. No friction rub. No gallop.   Pulmonary:     Effort: Pulmonary effort is normal.     Breath sounds: Normal breath sounds.     Comments: Lungs clear to auscultation bilaterally.  Symmetric chest rise.  No wheezing, rales, rhonchi. Abdominal:     Palpations: Abdomen is soft. Abdomen is not  rigid.     Tenderness: There is no abdominal tenderness. There is no guarding.     Hernia: A hernia is present. Hernia is present in the umbilical area.     Comments: Large periumbilical hernia with no overlying warmth, erythema.  No tenderness.  Abdomen is soft, nondistended.  No tenderness noted.  No rigidity, guarding.  Musculoskeletal:        General: Normal range of motion.     Cervical back: Full passive range of motion without pain.  Skin:    General: Skin is warm and dry.     Capillary Refill: Capillary refill takes less than 2 seconds.     Comments: Quarter sized wound noted to the plantar surface of the right foot underlying the left first toe.  There is some necrotic tissue noted with some surrounding erythema.  No crepitus.  There is some mild active bleeding but no active drainage.   Neurological:     Mental Status: He is alert and oriented to person, place, and time.  Psychiatric:        Speech: Speech normal.     ED Results / Procedures / Treatments   Labs (all labs ordered are listed, but only abnormal results are displayed) Labs Reviewed  COMPREHENSIVE METABOLIC PANEL - Abnormal; Notable for the following components:      Result Value   Sodium 132 (*)    Chloride 95 (*)    Glucose, Bld 294 (*)    BUN 25 (*)    All other components within normal limits  CBC WITH DIFFERENTIAL/PLATELET - Abnormal; Notable for the following components:   WBC 18.8 (*)    RBC 6.87 (*)    HCT 53.1 (*)    MCV 77.3 (*)    MCH 24.5  (*)    RDW 16.9 (*)    Neutro Abs 16.4 (*)    Monocytes Absolute 1.3 (*)    Abs Immature Granulocytes 0.15 (*)    All other components within normal limits  LACTIC ACID, PLASMA - Abnormal; Notable for the following components:   Lactic Acid, Venous 2.5 (*)    All other components within normal limits  LACTIC ACID, PLASMA - Abnormal; Notable for the following components:   Lactic Acid, Venous 2.1 (*)    All other components within normal limits  URINALYSIS, ROUTINE W REFLEX MICROSCOPIC - Abnormal; Notable for the following components:   Color, Urine STRAW (*)    Glucose, UA >=500 (*)    Ketones, ur 20 (*)    All other components within normal limits  SARS CORONAVIRUS 2 BY RT PCR (HOSPITAL ORDER, PERFORMED IN [facility])  CULTURE, BLOOD (ROUTINE X 2)  CULTURE, BLOOD (ROUTINE X 2)  URINE CULTURE  APTT  PROTIME-INR  PROTIME-INR  HIV ANTIBODY (ROUTINE TESTING W REFLEX)  MAGNESIUM  TSH  PROCALCITONIN  HEPARIN LEVEL (UNFRACTIONATED)  CBC  TROPONIN I (HIGH SENSITIVITY)    EKG EKG Interpretation  Date/Time:  Wednesday Dec 15 2019 17:26:34 EDT Ventricular Rate:  105 PR Interval:    QRS Duration: 86 QT Interval:  309 QTC Calculation: 409 R Axis:   67 Text Interpretation: Sinus tachycardia with irregular rate may be Atrial fib Confirmed by Vanetta Mulders [id number]) on 12/15/2019 7:55:55 PM   Radiology DG Chest 1 View  Result Date: 12/15/2019 CLINICAL DATA:  Patient with wound infection of the foot. EXAM: CHEST  1 VIEW COMPARISON:  Chest radiograph 01/19/2008. FINDINGS: Monitoring leads overlie the patient. Stable cardiac and mediastinal  contours. Pulmonary vascular redistribution. No pleural effusion or pneumothorax. IMPRESSION: Mild cardiomegaly with pulmonary vascular redistribution. Electronically Signed   By: Annia Belt M.D.   On: 12/15/2019 18:23   DG Foot Complete Right  Result Date: 12/15/2019 CLINICAL DATA:  Wound infection plantar aspect right first  metatarsophalangeal joint, fever, nausea EXAM: RIGHT FOOT COMPLETE - 3+ VIEW COMPARISON:  12/01/2019, 11/29/2019 FINDINGS: Frontal, oblique, lateral views of the right foot are obtained. There is progressive soft tissue swelling plantar aspect first digit at the level of the metatarsophalangeal joint. Soft tissue ulceration is again noted. There is no underlying bony destruction or periosteal reaction to suggest osteomyelitis. No fracture, subluxation, or dislocation. Joint spaces are well preserved. IMPRESSION: 1. Increasing soft tissue swelling at the site of right first digit soft tissue ulceration. 2. No evidence of osteomyelitis. Electronically Signed   By: Sharlet Salina M.D.   On: 12/15/2019 18:22    Procedures .Critical Care Performed by: Maxwell Caul, PA-C Authorized by: Maxwell Caul, PA-C   Critical care provider statement:    Critical care time (minutes):  35   Critical care was necessary to treat or prevent imminent or life-threatening deterioration of the following conditions:  Sepsis   Critical care was time spent personally by me on the following activities:  Discussions with consultants, evaluation of patient's response to treatment, examination of patient, ordering and performing treatments and interventions, ordering and review of laboratory studies, ordering and review of radiographic studies, pulse oximetry, re-evaluation of patient's condition, obtaining history from patient or surrogate and review of old charts   (including critical care time)  Medications Ordered in ED Medications  vancomycin (VANCOREADY) IVPB 2000 mg/400 mL (2,000 mg Intravenous New Bag/Given 12/15/19 2154)  vancomycin (VANCOCIN) IVPB 1000 mg/200 mL premix (has no administration in time range)  aspirin chewable tablet 81 mg (has no administration in time range)  lisinopril (ZESTRIL) tablet 20 mg (has no administration in time range)  metoprolol tartrate (LOPRESSOR) tablet 25 mg (has no  administration in time range)  nitroGLYCERIN (NITROSTAT) SL tablet 0.4 mg (has no administration in time range)  rosuvastatin (CRESTOR) tablet 40 mg (has no administration in time range)  ondansetron (ZOFRAN) tablet 4 mg (has no administration in time range)    Or  ondansetron (ZOFRAN) injection 4 mg (has no administration in time range)  insulin aspart (novoLOG) injection 0-9 Units (has no administration in time range)  insulin glargine (LANTUS) injection 10 Units (has no administration in time range)  ceFEPIme (MAXIPIME) 2 g in sodium chloride 0.9 % 100 mL IVPB (has no administration in time range)  metroNIDAZOLE (FLAGYL) IVPB 500 mg (has no administration in time range)  0.9 %  sodium chloride infusion (has no administration in time range)  ceFEPIme (MAXIPIME) 2 g in sodium chloride 0.9 % 100 mL IVPB (has no administration in time range)  heparin bolus via infusion 5,000 Units (has no administration in time range)  heparin ADULT infusion 100 units/mL (25000 units/27mL sodium chloride 0.45%) (has no administration in time range)  sodium chloride 0.9 % bolus 1,000 mL (0 mLs Intravenous Stopped 12/15/19 1852)  ondansetron (ZOFRAN) injection 4 mg (4 mg Intravenous Given 12/15/19 1743)  morphine 4 MG/ML injection 4 mg (4 mg Intravenous Given 12/15/19 1743)  acetaminophen (TYLENOL) tablet 1,000 mg (1,000 mg Oral Given 12/15/19 1742)  sodium chloride 0.9 % bolus 1,000 mL (1,000 mLs Intravenous New Bag/Given 12/15/19 2001)    ED Course  I have reviewed the triage vital signs  and the nursing notes.  Pertinent labs & imaging results that were available during my care of the patient were reviewed by me and considered in my medical decision making (see chart for details).    MDM Rules/Calculators/A&P                      64 year old male who presents for evaluation of fever.  Was seen at orthopedics for evaluation of his wound on his foot when he developed fever and felt bad.  Had an episode of  nausea/vomiting.  States he has been in his normal state of health prior to onset of symptoms.  On initial ED arrival, he is febrile at 101.7, tachycardic and tachypneic.  He meets SIRS criteria.  Unclear infectious etiology though he does have the wounds that may be driving this.  We will plan to check labs, imaging.  He does appear to be slightly irregular on his heart rhythm.  He has no prior history of A. fib.  Question if this is brought on by infectious etiology.  His tachycardia improved and he does not appear to be in A. fib with RVR.  We will hold off on any medications at this time.  UA is negative for infectious etiology.  CBC shows leukocytosis of 18.8.  CMP shows sodium 132, potassium of 4.6.  Glucose is 294.  BUN is 25, creatinine 0.98.  X-ray of foot shows increasing soft tissue swelling at the site of the right first digit with soft tissue ulceration.  No evidence of osteomyelitis.  Chest x-ray shows no evidence of infectious etiology.  There is cardiomegaly with pulmonary vascular distribution.  Lactic is elevated at 2.5.  Code sepsis initiated.  We will plan to start patient on antibiotics for coverage of cellulitis as I suspect that this is the source.  At this time, given the patient has a lactic less than 4 and good blood pressures, will not initiate full 30 cc/kg of fluid given pulmonary redistribution seen on chest x-ray.  At this time, concern for sepsis.  Likely source to do from wound to foot.  Should not have any other etiology of infection.  At this time, concern for sepsis, will plan for admission.  Discussed with Dr. Hal Hope (hospitalist) who accepts patient for admission.   Portions of this note were generated with [employer]obby[employer]. Dictation errors may occur despite best attempts at proofreading.  Final Clinical Impression(s) / ED Diagnoses Final diagnoses:  Sepsis, due to unspecified organism, unspecified whether acute organ dysfunction present [facility])  Atrial  fibrillation, unspecified type [facility])    Rx / DC Orders ED Discharge Orders    None       Desma Mcgregor 12/15/19 2243    Fredia Sorrow, MD 12/16/19 1534

## 2019-12-15 NOTE — ED Notes (Signed)
This RN called Lab to ensure Lactic Acid was in prrocess; per Lab Technician, LA is in process

## 2019-12-15 NOTE — Progress Notes (Signed)
Pharmacy Antibiotic Note  Chad Maddox is a 64 y.o. male admitted on 12/15/2019 with sepsis.  Pharmacy has been consulted for vancomycin dosing.  Plan: - Vancomycin 2000 mg IV load x1, followed by vancomycin 1000 mg IV every 12 hours per nomogram - Ceftriaxone 1 g IV every 24 hours - Monitor clinical status and renal function - Follow-up cultures and de-escalate  Height: 5\' 10"  (177.8 cm) Weight: 102.1 kg (225 lb) IBW/kg (Calculated) : 73  Temp (24hrs), Avg:101.2 F (38.4 C), Min:100.6 F (38.1 C), Max:101.7 F (38.7 C)  Recent Labs  Lab 12/15/19 1730 12/15/19 1755  WBC  --  18.8*  CREATININE  --  0.98  LATICACIDVEN 2.5*  --     Estimated Creatinine Clearance: 92.3 mL/min (by C-G formula based on SCr of 0.98 mg/dL).    Allergies  Allergen Reactions  . Shellfish Allergy Shortness Of Breath and Swelling  . Penicillins Other (See Comments)    Occurred as an infant; reaction unknown.    Antimicrobials this admission: Ceftriaxone 5/26 >> Vancomycin 5/26 >>  Microbiology results: 5/26 BCx x2: sent 5/26 UCx: sent     Dacie Mandel L. 6/26, PharmD [facility] PGY1 Pharmacy Resident [phone] 12/15/19     7:39 PM  Please check AMION for all [facility] Pharmacy phone numbers After 10:00 PM, call the Main Pharmacy [phone]

## 2019-12-15 NOTE — ED Triage Notes (Signed)
Picked up at [facility] BIB by [facility], began to have nausea, vomitting, and increased fever. Tachypnic, tachycardic in Afib with no history. 94% on RA.

## 2019-12-16 ENCOUNTER — Inpatient Hospital Stay (HOSPITAL_COMMUNITY): Payer: Medicare Other

## 2019-12-16 DIAGNOSIS — I491 Atrial premature depolarization: Secondary | ICD-10-CM

## 2019-12-16 DIAGNOSIS — I10 Essential (primary) hypertension: Secondary | ICD-10-CM

## 2019-12-16 DIAGNOSIS — L03115 Cellulitis of right lower limb: Secondary | ICD-10-CM

## 2019-12-16 DIAGNOSIS — I2510 Atherosclerotic heart disease of native coronary artery without angina pectoris: Secondary | ICD-10-CM

## 2019-12-16 DIAGNOSIS — I4891 Unspecified atrial fibrillation: Secondary | ICD-10-CM

## 2019-12-16 LAB — CBG MONITORING, ED
Glucose-Capillary: 288 mg/dL — ABNORMAL HIGH (ref 70–99)
Glucose-Capillary: 243 mg/dL — ABNORMAL HIGH (ref 70–99)

## 2019-12-16 LAB — PROTIME-INR
INR: 1 (ref 0.8–1.2)
Prothrombin Time: 12.9 seconds (ref 11.4–15.2)

## 2019-12-16 LAB — TSH: TSH: 0.688 u[IU]/mL (ref 0.350–4.500)

## 2019-12-16 LAB — BLOOD CULTURE ID PANEL (REFLEXED)

## 2019-12-16 LAB — CBC
HCT: 47.2 % (ref 39.0–52.0)
Hemoglobin: 15 g/dL (ref 13.0–17.0)
MCH: 24.7 pg — ABNORMAL LOW (ref 26.0–34.0)
MCHC: 31.8 g/dL (ref 30.0–36.0)
MCV: 77.6 fL — ABNORMAL LOW (ref 80.0–100.0)
Platelets: 179 10*3/uL (ref 150–400)
RBC: 6.08 MIL/uL — ABNORMAL HIGH (ref 4.22–5.81)
RDW: 15.8 % — ABNORMAL HIGH (ref 11.5–15.5)
WBC: 16.2 10*3/uL — ABNORMAL HIGH (ref 4.0–10.5)
nRBC: 0 % (ref 0.0–0.2)

## 2019-12-16 LAB — HEPARIN LEVEL (UNFRACTIONATED): Heparin Unfractionated: 0.28 IU/mL — ABNORMAL LOW (ref 0.30–0.70)

## 2019-12-16 LAB — ECHOCARDIOGRAM COMPLETE
Height: 70 in
Weight: 3600 oz

## 2019-12-16 LAB — MRSA PCR SCREENING: MRSA by PCR: NEGATIVE

## 2019-12-16 LAB — MAGNESIUM
Magnesium: 1.6 mg/dL — ABNORMAL LOW (ref 1.7–2.4)
Magnesium: 1.6 mg/dL — ABNORMAL LOW (ref 1.7–2.4)

## 2019-12-16 LAB — GLUCOSE, CAPILLARY
Glucose-Capillary: 272 mg/dL — ABNORMAL HIGH (ref 70–99)
Glucose-Capillary: 276 mg/dL — ABNORMAL HIGH (ref 70–99)
Glucose-Capillary: 187 mg/dL — ABNORMAL HIGH (ref 70–99)
Glucose-Capillary: 198 mg/dL — ABNORMAL HIGH (ref 70–99)

## 2019-12-16 LAB — PROCALCITONIN: Procalcitonin: 0.87 ng/mL

## 2019-12-16 LAB — TROPONIN I (HIGH SENSITIVITY)
Troponin I (High Sensitivity): 12 ng/L (ref <18)
Troponin I (High Sensitivity): 11 ng/L (ref <18)

## 2019-12-16 LAB — HIV ANTIBODY (ROUTINE TESTING W REFLEX): HIV Screen 4th Generation wRfx: NONREACTIVE

## 2019-12-16 MED ORDER — TRAMADOL HCL 50 MG PO TABS
50.0000 mg | ORAL_TABLET | Freq: Once | ORAL | Status: AC
  Administered 2019-12-16: 50 mg via ORAL
  Filled 2019-12-16: qty 1

## 2019-12-16 MED ORDER — MORPHINE SULFATE (PF) 2 MG/ML IV SOLN
2.0000 mg | INTRAVENOUS | Status: DC | PRN
  Administered 2019-12-16 – 2019-12-17 (×3): 2 mg via INTRAVENOUS
  Filled 2019-12-16 (×3): qty 1

## 2019-12-16 MED ORDER — TRAMADOL HCL 50 MG PO TABS: 50.0000 mg | ORAL_TABLET | Freq: Two times a day (BID) | ORAL | Status: DC | PRN

## 2019-12-16 MED ORDER — OXYCODONE-ACETAMINOPHEN 5-325 MG PO TABS
1.0000 | ORAL_TABLET | Freq: Four times a day (QID) | ORAL | Status: DC | PRN
  Administered 2019-12-16 – 2019-12-18 (×9): 2 via ORAL
  Filled 2019-12-16 (×9): qty 2

## 2019-12-16 MED ORDER — LIVING WELL WITH DIABETES BOOK
Freq: Once | Status: AC
  Filled 2019-12-16: qty 1

## 2019-12-16 NOTE — Plan of Care (Signed)

## 2019-12-16 NOTE — Plan of Care (Signed)
  Problem: Education: Goal: Knowledge of General Education information will improve Description: Including pain rating scale, medication(s)/side effects and non-pharmacologic comfort measures Outcome: Progressing   Problem: Clinical Measurements: Goal: Ability to maintain clinical measurements within normal limits will improve Outcome: Progressing   Problem: Clinical Measurements: Goal: Cardiovascular complication will be avoided Outcome: Progressing   Problem: Nutrition: Goal: Adequate nutrition will be maintained Outcome: Progressing   Problem: Coping: Goal: Level of anxiety will decrease Outcome: Progressing   Problem: Pain Managment: Goal: General experience of comfort will improve Outcome: Progressing

## 2019-12-16 NOTE — Progress Notes (Signed)
PROGRESS NOTE    Chad Maddox  ZOX:[id number] DOB: 1955/10/28 DOA: 12/15/2019 PCP: System, Pcp Not In   Brief Narrative:  HPI on 12/15/2019 by Dr. Midge Minium Chad Maddox is a 64 y.o. 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  Patient has been a ulceration since September 2020.  Last week patient had an MRI done which shows features consistent with cellulitis with no bony involvement.  Today patient was following up with orthopedics and was waiting in the office when patient became febrile tachycardic and had nausea with 1 episode of vomiting.  Denies any abdominal pain or diarrhea.  Given the symptoms patient was brought to the ER.  Patient denies any chest pain or shortness of breath or productive cough. Assessment & Plan   Sepsis possibly secondary to right foot ulceration and cellulitis -Present on admission, noted to have fever of 101.7 F, tachycardia, tachypnea with leukocytosis -Cellulitis order set as well as sepsis order set utilized -Patient states he has been seeing a foot doctor since September 2020 for which she sees him every week.  He states it has not gotten any better.  He noted that he had a fever prior to admission and started feeling poorly. -Chest x-ray and UA unremarkable for infection -COVID-19 negative -Right foot x-ray showed increased soft tissue swelling at the right first digit soft tissue ulceration.  No evidence of osteomyelitis -MRI of the right foot on 12/01/2019 (see prior to admission) showed a superficial soft tissue wound/ulceration with on cellulitis underlying the plantar aspect of the first metatarsal head.  No evidence of osteomyelitis. -Currently on IV antibiotics, vancomycin, Flagyl, cefepime -Continue IV fluids -Blood cultures show no growth to date -Orthopedic  surgery consulted and appreciated -Will add on IV morphine as well as hydrocodone for pain control  Atrial fibrillation with RVR, new onset -Heart rate appears to be rate controlled at this time -Continue heparin -Appears to be in sinus rhythm -CHA2DS2-VASc 2 (HTN, DM) -will obtain EKG -Echocardiogram obtained showing an EF of 55 to 60%, mild LVH.  LV diastolic parameters normal.  Diabetes mellitus, type II-uncontrolled with hyperglycemia  -Continue Lantus, insulin sliding scale and CBG monitoring -Home medications of Invokana, glipizide, Metformin held -Last hemoglobin A1c on 11/29/2019 was 11  Essential hypertension -Continue lisinopril, metoprolol  History of CAD -Currently denies any chest pain -Continue aspirin, statin, lisinopril, metoprolol  Umbilical hernia  -Continue to monitor  DVT Prophylaxis  Heparin  Code Status: Full  Family Communication: None at bedside  Disposition Plan:  Status is: Inpatient  Remains inpatient appropriate because:Hemodynamically unstable, IV treatments appropriate due to intensity of illness or inability to take PO and Inpatient level of care appropriate due to severity of illness   Dispo: The patient is from: Home              Anticipated d/c is to: Home              Anticipated d/c date is: 3 days              Patient currently is not medically stable to d/c.  Consultants Orthopedic surgery  Procedures  None  Antibiotics   Anti-infectives (From admission, onward)   Start     Dose/Rate Route Frequency Ordered Stop   12/16/19 1000  vancomycin (VANCOCIN) IVPB 1000 mg/200 mL premix  1,000 mg 200 mL/hr over 60 Minutes Intravenous Every 12 hours 12/15/19 1940     12/16/19 0730  ceFEPIme (MAXIPIME) 2 g in sodium chloride 0.9 % 100 mL IVPB     2 g 200 mL/hr over 30 Minutes Intravenous Every 8 hours 12/15/19 2217     12/15/19 2230  metroNIDAZOLE (FLAGYL) IVPB 500 mg     500 mg 100 mL/hr over 60 Minutes Intravenous Every 8 hours  12/15/19 2211     12/15/19 2215  ceFEPIme (MAXIPIME) 2 g in sodium chloride 0.9 % 100 mL IVPB     2 g 200 mL/hr over 30 Minutes Intravenous  Once 12/15/19 2211 12/16/19 0437   12/15/19 2215  vancomycin (VANCOCIN) IVPB 1000 mg/200 mL premix  Status:  Discontinued     1,000 mg 200 mL/hr over 60 Minutes Intravenous  Once 12/15/19 2211 12/15/19 2216   12/15/19 2000  cefTRIAXone (ROCEPHIN) 1 g in sodium chloride 0.9 % 100 mL IVPB  Status:  Discontinued     1 g 200 mL/hr over 30 Minutes Intravenous Every 24 hours 12/15/19 1936 12/15/19 2211   12/15/19 2000  vancomycin (VANCOREADY) IVPB 2000 mg/400 mL     2,000 mg 200 mL/hr over 120 Minutes Intravenous  Once 12/15/19 1937 12/16/19 0012   12/15/19 1915  vancomycin (VANCOCIN) IVPB 1000 mg/200 mL premix  Status:  Discontinued     1,000 mg 200 mL/hr over 60 Minutes Intravenous  Once 12/15/19 1913 12/15/19 1937   12/15/19 1915  levofloxacin (LEVAQUIN) IVPB 750 mg  Status:  Discontinued     750 mg 100 mL/hr over 90 Minutes Intravenous  Once 12/15/19 1913 12/15/19 1932      Subjective:   [name] seen and examined today.  He does have pain in the right foot.  States that tramadol did not help him.  Denies current chest pain or shortness of breath, abdominal pain, nausea or vomiting, diarrhea or constipation.  Objective:   Vitals:   12/16/19 0219 12/16/19 0300 12/16/19 0440 12/16/19 0849  BP: 124/60 128/74 113/78 123/77  Pulse: (!) 118 79  74  Resp: 14 16 18  (!) 24  Temp: 97.6 F (36.4 C)  97.6 F (36.4 C) 97.6 F (36.4 C)  TempSrc: Oral  Oral Oral  SpO2: 100% 95%  96%  Weight:      Height: 5\' 10"  (1.778 m)       Intake/Output Summary (Last 24 hours) at 12/16/2019 1107 Last data filed at 12/16/2019 0630 Gross per 24 hour  Intake 50.03 ml  Output 800 ml  Net -749.97 ml   Filed Weights   12/15/19 1726  Weight: 102.1 kg    Exam  General: Well developed, well nourished, NAD, appears stated age  HEENT: NCAT, mucous  membranes moist.   Cardiovascular: S1 S2 auscultated, RRR  Respiratory: Clear to auscultation bilaterally with equal chest rise  Abdomen: Soft, nontender, nondistended, + bowel sounds, +hernia  Extremities: warm dry without cyanosis clubbing. + Bilateral pedal edema.  Dressing in place on right foot  Neuro: AAOx3, nonfocal  Psych: Appropriate mood and affect, pleasant   Data Reviewed: I have personally reviewed following labs and imaging studies  CBC: Recent Labs  Lab 12/15/19 1755 12/16/19 0648  WBC 18.8* 16.2*  NEUTROABS 16.4*  --   HGB 16.8 15.0  HCT 53.1* 47.2  MCV 77.3* 77.6*  PLT 212 179   Basic Metabolic Panel: Recent Labs  Lab 12/15/19 1755 12/16/19 0013  NA 132*  --  K 4.6  --   CL 95*  --   CO2 23  --   GLUCOSE 294*  --   BUN 25*  --   CREATININE 0.98  --   CALCIUM 9.5  --   MG  --  1.6*   GFR: Estimated Creatinine Clearance: 92.3 mL/min (by C-G formula based on SCr of 0.98 mg/dL). Liver Function Tests: Recent Labs  Lab 12/15/19 1755  AST 19  ALT 22  ALKPHOS 70  BILITOT 1.0  PROT 6.9  ALBUMIN 3.9   No results for input(s): LIPASE, AMYLASE in the last 168 hours. No results for input(s): AMMONIA in the last 168 hours. Coagulation Profile: Recent Labs  Lab 12/15/19 1911 12/16/19 0013  INR 1.0 1.0   Cardiac Enzymes: No results for input(s): CKTOTAL, CKMB, CKMBINDEX, TROPONINI in the last 168 hours. BNP (last 3 results) No results for input(s): PROBNP in the last 8760 hours. HbA1C: No results for input(s): HGBA1C in the last 72 hours. CBG: Recent Labs  Lab 12/16/19 0112 12/16/19 0155 12/16/19 0853  GLUCAP 288* 243* 272*   Lipid Profile: No results for input(s): CHOL, HDL, LDLCALC, TRIG, CHOLHDL, LDLDIRECT in the last 72 hours. Thyroid Function Tests: Recent Labs    12/16/19 0013  TSH 0.688   Anemia Panel: No results for input(s): VITAMINB12, FOLATE, FERRITIN, TIBC, IRON, RETICCTPCT in the last 72 hours. Urine analysis:     Component Value Date/Time   COLORURINE STRAW (A) 12/15/2019 1731   APPEARANCEUR CLEAR 12/15/2019 1731   LABSPEC 1.027 12/15/2019 1731   PHURINE 5.0 12/15/2019 1731   GLUCOSEU >=500 (A) 12/15/2019 1731   HGBUR NEGATIVE 12/15/2019 1731   BILIRUBINUR NEGATIVE 12/15/2019 1731   KETONESUR 20 (A) 12/15/2019 1731   PROTEINUR NEGATIVE 12/15/2019 1731   UROBILINOGEN 0.2 01/18/2008 1450   NITRITE NEGATIVE 12/15/2019 1731   LEUKOCYTESUR NEGATIVE 12/15/2019 1731   Sepsis Labs: @LABRCNTIP (procalcitonin:4,lacticidven:4)  ) Recent Results (from the past 240 hour(s))  Blood culture (routine x 2)     Status: None (Preliminary result)   Collection Time: 12/15/19  5:31 PM   Specimen: BLOOD  Result Value Ref Range Status   Specimen Description BLOOD BLOOD LEFT FOREARM  Final   Special Requests   Final    BOTTLES DRAWN AEROBIC AND ANAEROBIC Blood Culture adequate volume   Culture   Final    NO GROWTH < 24 HOURS Performed at [facility], [address] [place], [place] [address]    Report Status PENDING  Incomplete  Blood culture (routine x 2)     Status: None (Preliminary result)   Collection Time: 12/15/19  5:31 PM   Specimen: BLOOD LEFT HAND  Result Value Ref Range Status   Specimen Description BLOOD LEFT HAND  Final   Special Requests   Final    BOTTLES DRAWN AEROBIC AND ANAEROBIC Blood Culture adequate volume   Culture   Final    NO GROWTH < 24 HOURS Performed at [facility], [address] [place], [place] [address]    Report Status PENDING  Incomplete  SARS Coronavirus 2 by RT PCR (hospital order, performed in [facility] hospital lab) Nasopharyngeal Nasopharyngeal Swab     Status: None   Collection Time: 12/15/19  5:31 PM   Specimen: Nasopharyngeal Swab  Result Value Ref Range Status   SARS Coronavirus 2 NEGATIVE NEGATIVE Final    Comment: (NOTE) SARS-CoV-2 target nucleic acids are NOT DETECTED. The SARS-CoV-2 RNA is generally detectable in upper and  lower respiratory specimens during the acute phase of infection. The lowest concentration of SARS-CoV-2 viral copies this assay can detect is 250 copies / mL. A negative result does not preclude SARS-CoV-2 infection and should not be used as the sole basis for treatment or other patient management decisions.  A negative result may occur with improper specimen collection / handling, submission of specimen other than nasopharyngeal swab, presence of viral mutation(s) within the areas targeted by this assay, and inadequate number of viral copies (<250 copies / mL). A negative result must be combined with clinical observations, patient history, and epidemiological information. Fact Sheet for Patients:   StrictlyIdeas.no Fact Sheet for Healthcare Providers: BankingDealers.co.za This test is not yet approved or cleared  by the [place] FDA and has been authorized for detection and/or diagnosis of SARS-CoV-2 by FDA under an Emergency Use Authorization (EUA).  This EUA will remain in effect (meaning this test can be used) for the duration of the COVID-19 declaration under Section 564(b)(1) of the Act, 21 U.S.C. section 360bbb-3(b)(1), unless the authorization is terminated or revoked sooner. Performed at [facility], [place] [address], [place], [place] [address]   MRSA PCR Screening     Status: None   Collection Time: 12/16/19  2:39 AM   Specimen: Nasal Mucosa; Nasopharyngeal  Result Value Ref Range Status   MRSA by PCR NEGATIVE NEGATIVE Final    Comment:        The GeneXpert MRSA Assay (FDA approved for NASAL specimens only), is one component of a comprehensive MRSA colonization surveillance program. It is not intended to diagnose MRSA infection nor to guide or monitor treatment for MRSA infections. Performed at [facility], [place] [address] [place], [place] [address]       Radiology Studies: DG Chest 1 View  Result  Date: 12/15/2019 CLINICAL DATA:  Patient with wound infection of the foot. EXAM: CHEST  1 VIEW COMPARISON:  Chest radiograph 01/19/2008. FINDINGS: Monitoring leads overlie the patient. Stable cardiac and mediastinal contours. Pulmonary vascular redistribution. No pleural effusion or pneumothorax. IMPRESSION: Mild cardiomegaly with pulmonary vascular redistribution. Electronically Signed   By: Lovey Newcomer M.D.   On: 12/15/2019 18:23   DG Foot Complete Right  Result Date: 12/15/2019 CLINICAL DATA:  Wound infection plantar aspect right first metatarsophalangeal joint, fever, nausea EXAM: RIGHT FOOT COMPLETE - 3+ VIEW COMPARISON:  12/01/2019, 11/29/2019 FINDINGS: Frontal, oblique, lateral views of the right foot are obtained. There is progressive soft tissue swelling plantar aspect first digit at the level of the metatarsophalangeal joint. Soft tissue ulceration is again noted. There is no underlying bony destruction or periosteal reaction to suggest osteomyelitis. No fracture, subluxation, or dislocation. Joint spaces are well preserved. IMPRESSION: 1. Increasing soft tissue swelling at the site of right first digit soft tissue ulceration. 2. No evidence of osteomyelitis. Electronically Signed   By: Randa Ngo M.D.   On: 12/15/2019 18:22   ECHOCARDIOGRAM COMPLETE  Result Date: 12/16/2019    ECHOCARDIOGRAM REPORT   Patient Name:   Chad Maddox Date of Exam: 12/16/2019 Medical Rec #:  [id number]      Height:       70.0 in Accession #:    [id number]     Weight:       225.0 lb Date of Birth:  10-06-1955      BSA:          2.194 m Patient Age:    58 years       BP:  113/78 mmHg Patient Gender: M              HR:           79 bpm. Exam Location:  Inpatient Procedure: 2D Echo Indications:    Atrial Fibrillation 427.31 / I48.91  History:        Patient has no prior history of Echocardiogram examinations.                 CAD; Risk Factors:Hypertension, Diabetes and Dyslipidemia.  Sonographer:    Leeroy Bock  Turrentine Referring Phys: [id number] ARSHAD N KAKRAKANDY IMPRESSIONS  1. Left ventricular ejection fraction, by estimation, is 55 to 60%. The left ventricle has normal function. The left ventricle has no regional wall motion abnormalities. There is mild left ventricular hypertrophy. Left ventricular diastolic parameters were normal.  2. Right ventricular systolic function is normal. The right ventricular size is normal. There is normal pulmonary artery systolic pressure. The estimated right ventricular systolic pressure is 26.4 mmHg.  3. Left atrial size was mildly dilated.  4. The mitral valve is abnormal. Trivial mitral valve regurgitation.  5. The aortic valve is tricuspid. Aortic valve regurgitation is not visualized.  6. The inferior vena cava is normal in size with greater than 50% respiratory variability, suggesting right atrial pressure of 3 mmHg. FINDINGS  Left Ventricle: Left ventricular ejection fraction, by estimation, is 55 to 60%. The left ventricle has normal function. The left ventricle has no regional wall motion abnormalities. The left ventricular internal cavity size was normal in size. There is  mild left ventricular hypertrophy. Left ventricular diastolic parameters were normal. Right Ventricle: The right ventricular size is normal. No increase in right ventricular wall thickness. Right ventricular systolic function is normal. There is normal pulmonary artery systolic pressure. The tricuspid regurgitant velocity is 2.42 m/s, and  with an assumed right atrial pressure of 3 mmHg, the estimated right ventricular systolic pressure is 26.4 mmHg. Left Atrium: Left atrial size was mildly dilated. Right Atrium: Right atrial size was normal in size. Pericardium: There is no evidence of pericardial effusion. Mitral Valve: The mitral valve is abnormal. There is mild thickening of the mitral valve leaflet(s). Trivial mitral valve regurgitation. Tricuspid Valve: The tricuspid valve is grossly normal. Tricuspid  valve regurgitation is trivial. Aortic Valve: The aortic valve is tricuspid. Aortic valve regurgitation is not visualized. Pulmonic Valve: The pulmonic valve was grossly normal. Pulmonic valve regurgitation is trivial. Aorta: The aortic root and ascending aorta are structurally normal, with no evidence of dilitation. Venous: The inferior vena cava is normal in size with greater than 50% respiratory variability, suggesting right atrial pressure of 3 mmHg. IAS/Shunts: No atrial level shunt detected by color flow Doppler.  LEFT VENTRICLE PLAX 2D LVIDd:         5.20 cm  Diastology LVIDs:         3.80 cm  LV e' lateral:   7.18 cm/s LV PW:         1.20 cm  LV E/e' lateral: 16.0 LV IVS:        1.20 cm  LV e' medial:    7.18 cm/s LVOT diam:     2.20 cm  LV E/e' medial:  16.0 LV SV:         103 LV SV Index:   47 LVOT Area:     3.80 cm  RIGHT VENTRICLE RV S prime:     11.40 cm/s TAPSE (M-mode): 1.5 cm LEFT ATRIUM  Index       RIGHT ATRIUM           Index LA diam:        4.70 cm 2.14 cm/m  RA Area:     20.20 cm LA Vol (A2C):   69.7 ml 31.76 ml/m RA Volume:   58.60 ml  26.70 ml/m LA Vol (A4C):   74.8 ml 34.09 ml/m LA Biplane Vol: 74.5 ml 33.95 ml/m  AORTIC VALVE LVOT Vmax:   141.00 cm/s LVOT Vmean:  91.400 cm/s LVOT VTI:    0.271 m  AORTA Ao Root diam: 3.20 cm MITRAL VALVE                TRICUSPID VALVE MV Area (PHT): 3.68 cm     TR Peak grad:   23.4 mmHg MV Decel Time: 206 msec     TR Vmax:        242.00 cm/s MV E velocity: 115.00 cm/s MV A velocity: 66.00 cm/s   SHUNTS MV E/A ratio:  1.74         Systemic VTI:  0.27 m                             Systemic Diam: 2.20 cm Zoila Shutter MD Electronically signed by Zoila Shutter MD Signature Date/Time: 12/16/2019/10:33:25 AM    Final      Scheduled Meds: . aspirin  81 mg Oral Daily  . insulin aspart  0-9 Units Subcutaneous TID WC  . insulin glargine  10 Units Subcutaneous QHS  . lisinopril  20 mg Oral Daily  . metoprolol tartrate  25 mg Oral BID  .  rosuvastatin  40 mg Oral Daily   Continuous Infusions: . sodium chloride 100 mL/hr at 12/16/19 0157  . ceFEPime (MAXIPIME) IV 2 g (12/16/19 0756)  . heparin 1,500 Units/hr (12/16/19 0858)  . metronidazole 500 mg (12/16/19 0757)  . vancomycin 1,000 mg (12/16/19 0950)     LOS: 1 day   Time Spent in minutes   45 minutes  Jeanise Durfey D.O. on 12/16/2019 at 11:07 AM  Between 7am to 7pm - Please see pager noted on [email or web address]  After 7pm go to [email or web address]  And look for the night coverage person covering for me after hours  [facility] Office  [phone]

## 2019-12-16 NOTE — Progress Notes (Signed)
ANTICOAGULATION CONSULT NOTE  Pharmacy Consult for heparin Indication: atrial fibrillation  Allergies  Allergen Reactions  . Shellfish Allergy Shortness Of Breath and Swelling  . Penicillins Other (See Comments)    Occurred as an infant; reaction unknown.    Patient Measurements: Height: 5\' 10"  (177.8 cm) Weight: 102.1 kg (225 lb) IBW/kg (Calculated) : 73 Heparin Dosing Weight: 94.5kg  Vital Signs: Temp: 97.6 F (36.4 C) (05/27 0440) Temp Source: Oral (05/27 0440) BP: 113/78 (05/27 0440) Pulse Rate: 79 (05/27 0300)  Labs: Recent Labs    12/15/19 1755 12/15/19 1911 12/16/19 0013 12/16/19 0155 12/16/19 0648  HGB 16.8  --   --   --  15.0  HCT 53.1*  --   --   --  47.2  PLT 212  --   --   --  179  APTT  --  26  --   --   --   LABPROT  --  12.8 12.9  --   --   INR  --  1.0 1.0  --   --   HEPARINUNFRC  --   --   --   --  0.28*  CREATININE 0.98  --   --   --   --   TROPONINIHS  --   --  12 11  --     Estimated Creatinine Clearance: 92.3 mL/min (by C-G formula based on SCr of 0.98 mg/dL).   Medical History: Past Medical History:  Diagnosis Date  . Coronary artery disease    s/p NSTEMI 01/2008  -- s/p BMS to RCA 01/2008  --  s/p XIENCE DES TO OM2 12/2008  --  OTW NONOBS DZS AND PATENT RCA STENT, EF 60%   . Diabetes mellitus   . GERD (gastroesophageal reflux disease)   . Hyperlipidemia, mixed   . Hypertension   . Obesity     Medications:  Infusions:  . sodium chloride 100 mL/hr at 12/16/19 0157  . ceFEPime (MAXIPIME) IV 2 g (12/16/19 0756)  . heparin 1,300 Units/hr (12/16/19 0200)  . metronidazole 500 mg (12/16/19 0757)  . vancomycin      Assessment: 70 yom presented to the ED with fever. Found to be in afib and now starting anticoagulation with heparin. Baseline Hgb and platelets are WNL. He is not on anticoagulation PTA.   Initial heparin level slightly below goal, CBC stable.  Goal of Therapy:  Heparin level 0.3-0.7 units/ml Monitor platelets by  anticoagulation protocol: Yes   Plan:  -Increase heparin to 1500 units/h -Recheck heparin level tonight  65, PharmD, BCPS Clinical Pharmacist [phone] Please check AMION for all [facility] numbers 12/16/2019

## 2019-12-16 NOTE — Consult Note (Signed)
Cardiology Consultation:   Patient ID: Chad Maddox MRN: [id number]; DOB: 26-Oct-1955  Admit date: 12/15/2019 Date of Consult: 12/16/2019  Primary Care Provider: System, [place] Not In Primary Cardiologist: Peter Martinique, MD  Primary Electrophysiologist:  None    Patient Profile:   Chad Maddox is a 64 y.o. male with a history of CAD with NSTEMI in 2009 treated with BMS to RCA and then DES to [place] in 2010, hypertension, hyperlipidemia, diabetes mellitus who is being seen today for the evaluation of new onset atrial fibrillation at the request of Dr. Hal Hope.  History of Present Illness:   [name] is a 64 year old male with the above history who was previously followed by Dr. Verl Blalock but now sees Dr. Martinique. He has known CAD s/p NSTEMI in 2009 which was treated with BMS to RCA. Also had DES to OM2 in 2010. He has been doing very well from a cardiac standpoint. He was last seen by Dr. Martinique in 01/2019 at which time he was doing well from a cardiac standpoint. However, he has chronic pain and has had a lot of issues with his hip and shoulders.   Patient stepped on a screw back in 03/2019 and has an ulcer on his right foot that has not healed since that time. He has followed with a Podiatrist in [place] without much improvement. He saw [facility] 2 weeks ago and reportedly had a MRI which was negative for osteomyelitis. He presented to Dr. Jess Barters office yesterday for a visit when he developed sudden onset of nausea, vomiting, and fever in their office. Upon arrival to the ED, patient febrile with temp of 101.7. There was concern for atrial fibrillation and patient was started on IV Heparin; however, on our review of EKG and telemetry looks like sinus rhythm with likely competing ectopic atrial rhythm. X-ray of foot showed soft tissue swelling. WBC was 18.8 and lactic acid was 2.5. Blood cultures were drawn and patient was started on empiric antibiotics. Patient was admitted with sepsis. Cardiology  consulted for atrial fibrillation.  Patient denies any cardiac complaints. No chest pain, shortness of breath, orthopnea, PND, palpitations, lightheadedness, dizziness, near syncope/syncope. He has mild swelling of both feet but no pain. He was feeling completely fine until sudden onset of symptoms yesterday. No other recent illnesses. No abnormal bleeding. His only complaint at this time is shoulder and back pain.   Upon review of EKG and telemetry, it does not look like patient is truly in atrial fibrillation. Rhythm is irregular but there are P waves before every QRS. Looks like sinus rhythm with possible competing ectopic atrial rhythm.   Past Medical History:  Diagnosis Date  . Coronary artery disease    s/p NSTEMI 01/2008  -- s/p BMS to RCA 01/2008  --  s/p XIENCE DES TO OM2 12/2008  --  OTW NONOBS DZS AND PATENT RCA STENT, EF 60%   . Diabetes mellitus   . GERD (gastroesophageal reflux disease)   . Hyperlipidemia, mixed   . Hypertension   . Obesity     Past Surgical History:  Procedure Laterality Date  . CARDIAC CATHETERIZATION  12/22/2008    Triple-vessel coronary artery disease -- Patent stent in the proximal right coronary artery -- Severe stenosis of the second obtuse marginal branch -- Successful percutaneous coronary intervention with placement of a Xience drug-eluting stent in the second obtuse marginal branch.  -- Normal left ventricular systolic function     Home Medications:  Prior to Admission medications  Medication Sig Start Date End Date Taking? Authorizing Provider  aspirin 81 MG tablet Take 81 mg by mouth daily.     Yes [provider]  canagliflozin (INVOKANA) 100 MG TABS tablet Take 100 mg by mouth daily.   Yes [provider]  glipiZIDE (GLUCOTROL XL) 10 MG 24 hr tablet Take 10 mg by mouth daily.     Yes [provider]  lisinopril (PRINIVIL,ZESTRIL) 20 MG tablet Take 1 tablet (20 mg total) by mouth daily. 04/19/16  Yes [place], Peter M, MD   metFORMIN (GLUCOPHAGE-XR) 500 MG 24 hr tablet Take 1,000 mg by mouth 2 (two) times daily.     Yes [provider]  metoprolol tartrate (LOPRESSOR) 25 MG tablet Take 1 tablet (25 mg total) by mouth 2 (two) times daily. 04/19/16  Yes [place], Peter M, MD  nitroGLYCERIN (NITROSTAT) 0.4 MG SL tablet Place 1 tablet (0.4 mg total) under the tongue every 5 (five) minutes as needed. Patient taking differently: Place 0.4 mg under the tongue every 5 (five) minutes as needed for chest pain.  04/19/16  Yes [place], Peter M, MD  rosuvastatin (CRESTOR) 40 MG tablet TAKE 1 TABLET BY MOUTH  DAILY 02/16/19  Yes [place], Peter M, MD  traMADol (ULTRAM) 50 MG tablet Take 1 tablet (50 mg total) by mouth every 8 (eight) hours as needed. Patient taking differently: Take 50 mg by mouth every 8 (eight) hours as needed for moderate pain.  05/05/19  Yes Magnant, Joycie Peek, PA-C    Inpatient Medications: Scheduled Meds: . aspirin  81 mg Oral Daily  . insulin aspart  0-9 Units Subcutaneous TID WC  . insulin glargine  10 Units Subcutaneous QHS  . lisinopril  20 mg Oral Daily  . living well with diabetes book   Does not apply Once  . metoprolol tartrate  25 mg Oral BID  . rosuvastatin  40 mg Oral Daily   Continuous Infusions: . sodium chloride 100 mL/hr at 12/16/19 0157  . ceFEPime (MAXIPIME) IV 2 g (12/16/19 0756)  . heparin 1,500 Units/hr (12/16/19 0858)  . metronidazole 500 mg (12/16/19 0757)  . vancomycin 1,000 mg (12/16/19 0950)   PRN Meds: morphine injection, nitroGLYCERIN, ondansetron **OR** ondansetron (ZOFRAN) IV, oxyCODONE-acetaminophen  Allergies:    Allergies  Allergen Reactions  . Shellfish Allergy Shortness Of Breath and Swelling  . Penicillins Other (See Comments)    Occurred as an infant; reaction unknown.    Social History:   Social History   Socioeconomic History  . Marital status: Married    Spouse name: Not on file  . Number of children: Not on file  . Years of education: Not on  file  . Highest education level: Not on file  Occupational History  . Not on file  Tobacco Use  . Smoking status: Never Smoker  . Smokeless tobacco: Never Used  Substance and Sexual Activity  . Alcohol use: No  . Drug use: No  . Sexual activity: Not on file  Other Topics Concern  . Not on file  Social History Narrative  . Not on file   Social Determinants of Health   Financial Resource Strain:   . Difficulty of Paying Living Expenses:   Food Insecurity:   . Worried About Programme researcher, broadcasting/film/video in the Last Year:   . Barista in the Last Year:   Transportation Needs:   . Freight forwarder (Medical):   [name] Lack of Transportation (Non-Medical):   Physical Activity:   .  Days of Exercise per Week:   . Minutes of Exercise per Session:   Stress:   . Feeling of Stress :   Social Connections:   . Frequency of Communication with Friends and Family:   . Frequency of Social Gatherings with Friends and Family:   . Attends Religious Services:   . Active Member of Clubs or Organizations:   . Attends Banker Meetings:   [name] Marital Status:   Intimate Partner Violence:   . Fear of Current or Ex-Partner:   . Emotionally Abused:   [name] Physically Abused:   . Sexually Abused:     Family History:    Family History  Problem Relation Age of Onset  . Cerebral aneurysm Mother   . Diabetes Mother   . COPD Father   . Bone cancer Father   . Lung cancer Father   . Lymphoma Sister      ROS:  Please see the history of present illness.  All other ROS reviewed and negative.     Physical Exam/Data:   Vitals:   12/16/19 0300 12/16/19 0440 12/16/19 0849 12/16/19 1149  BP: 128/74 113/78 123/77 104/76  Pulse: 79  74 64  Resp: 16 18 (!) 24 18  Temp:  97.6 F (36.4 C) 97.6 F (36.4 C) 98.3 F (36.8 C)  TempSrc:  Oral Oral Oral  SpO2: 95%  96% 95%  Weight:      Height:        Intake/Output Summary (Last 24 hours) at 12/16/2019 1153 Last data filed at 12/16/2019  0630 Gross per 24 hour  Intake 50.03 ml  Output 800 ml  Net -749.97 ml   Last 3 Weights 12/15/2019 12/15/2019 05/05/2019  Weight (lbs) 225 lb 220 lb 220 lb  Weight (kg) 102.059 kg 99.791 kg 99.791 kg     Body mass index is 32.28 kg/m.  General: 64 y.o. male resting comfortably in no acute distress. HEENT: Normocephalic and atraumatic. Sclera clear.  Neck: Supple.  Heart: RRR. Distinct S1 and S2. No murmurs, gallops, or rubs. Radial and distal pedal pulses 2+ and equal bilaterally. Lungs: No increased work of breathing. Clear to ausculation bilaterally. No wheezes, rhonchi, or rales.  Abdomen: Soft, non-distended, and non-tender to palpation. Bowel sounds present. Umbilical hernia present. MSK: Normal strength and tone for age. Extremities: Mild pedal edema bilaterally.    Skin: Warm and dry. Neuro: Alert and oriented x3. No focal deficits. Psych: Normal affect. Responds appropriately.  EKG:  The EKG was personally reviewed and demonstrates:  Normal sinus rhythm with competing ectopic atrial rhythm Telemetry:  Telemetry was personally reviewed and demonstrates:  Normal sinus rhythm with competing ectopic atrial rhythm. PACs  Relevant CV Studies:  Echocardiogram 12/16/2019: Impressions: 1. Left ventricular ejection fraction, by estimation, is 55 to 60%. The  left ventricle has normal function. The left ventricle has no regional  wall motion abnormalities. There is mild left ventricular hypertrophy.  Left ventricular diastolic parameters  were normal.  2. Right ventricular systolic function is normal. The right ventricular  size is normal. There is normal pulmonary artery systolic pressure. The  estimated right ventricular systolic pressure is 26.4 mmHg.  3. Left atrial size was mildly dilated.  4. The mitral valve is abnormal. Trivial mitral valve regurgitation.  5. The aortic valve is tricuspid. Aortic valve regurgitation is not  visualized.  6. The inferior vena cava is  normal in size with greater than 50%  respiratory variability, suggesting right atrial pressure of 3 mmHg.  Laboratory Data:  High Sensitivity Troponin:   Recent Labs  Lab 12/16/19 0013 12/16/19 0155  TROPONINIHS 12 11     Chemistry Recent Labs  Lab 12/15/19 1755  NA 132*  K 4.6  CL 95*  CO2 23  GLUCOSE 294*  BUN 25*  CREATININE 0.98  CALCIUM 9.5  GFRNONAA >60  GFRAA >60  ANIONGAP 14    Recent Labs  Lab 12/15/19 1755  PROT 6.9  ALBUMIN 3.9  AST 19  ALT 22  ALKPHOS 70  BILITOT 1.0   Hematology Recent Labs  Lab 12/15/19 1755 12/16/19 0648  WBC 18.8* 16.2*  RBC 6.87* 6.08*  HGB 16.8 15.0  HCT 53.1* 47.2  MCV 77.3* 77.6*  MCH 24.5* 24.7*  MCHC 31.6 31.8  RDW 16.9* 15.8*  PLT 212 179   BNPNo results for input(s): BNP, PROBNP in the last 168 hours.  DDimer No results for input(s): DDIMER in the last 168 hours.   Radiology/Studies:  DG Chest 1 View  Result Date: 12/15/2019 CLINICAL DATA:  Patient with wound infection of the foot. EXAM: CHEST  1 VIEW COMPARISON:  Chest radiograph 01/19/2008. FINDINGS: Monitoring leads overlie the patient. Stable cardiac and mediastinal contours. Pulmonary vascular redistribution. No pleural effusion or pneumothorax. IMPRESSION: Mild cardiomegaly with pulmonary vascular redistribution. Electronically Signed   By: Annia Belt M.D.   On: 12/15/2019 18:23   DG Foot Complete Right  Result Date: 12/15/2019 CLINICAL DATA:  Wound infection plantar aspect right first metatarsophalangeal joint, fever, nausea EXAM: RIGHT FOOT COMPLETE - 3+ VIEW COMPARISON:  12/01/2019, 11/29/2019 FINDINGS: Frontal, oblique, lateral views of the right foot are obtained. There is progressive soft tissue swelling plantar aspect first digit at the level of the metatarsophalangeal joint. Soft tissue ulceration is again noted. There is no underlying bony destruction or periosteal reaction to suggest osteomyelitis. No fracture, subluxation, or dislocation.  Joint spaces are well preserved. IMPRESSION: 1. Increasing soft tissue swelling at the site of right first digit soft tissue ulceration. 2. No evidence of osteomyelitis. Electronically Signed   By: Sharlet Salina M.D.   On: 12/15/2019 18:22   ECHOCARDIOGRAM COMPLETE  Result Date: 12/16/2019    ECHOCARDIOGRAM REPORT   Patient Name:   Chad Maddox Date of Exam: 12/16/2019 Medical Rec #:  [id number]      Height:       70.0 in Accession #:    [id number]     Weight:       225.0 lb Date of Birth:  07/23/1955      BSA:          2.194 m Patient Age:    64 years       BP:           113/78 mmHg Patient Gender: M              HR:           79 bpm. Exam Location:  Inpatient Procedure: 2D Echo Indications:    Atrial Fibrillation 427.31 / I48.91  History:        Patient has no prior history of Echocardiogram examinations.                 CAD; Risk Factors:Hypertension, Diabetes and Dyslipidemia.  Sonographer:    Leeroy Bock Turrentine Referring Phys: [id number] ARSHAD N KAKRAKANDY IMPRESSIONS  1. Left ventricular ejection fraction, by estimation, is 55 to 60%. The left ventricle has normal function. The left ventricle has no regional wall motion abnormalities. There is mild  left ventricular hypertrophy. Left ventricular diastolic parameters were normal.  2. Right ventricular systolic function is normal. The right ventricular size is normal. There is normal pulmonary artery systolic pressure. The estimated right ventricular systolic pressure is 26.4 mmHg.  3. Left atrial size was mildly dilated.  4. The mitral valve is abnormal. Trivial mitral valve regurgitation.  5. The aortic valve is tricuspid. Aortic valve regurgitation is not visualized.  6. The inferior vena cava is normal in size with greater than 50% respiratory variability, suggesting right atrial pressure of 3 mmHg. FINDINGS  Left Ventricle: Left ventricular ejection fraction, by estimation, is 55 to 60%. The left ventricle has normal function. The left ventricle has no  regional wall motion abnormalities. The left ventricular internal cavity size was normal in size. There is  mild left ventricular hypertrophy. Left ventricular diastolic parameters were normal. Right Ventricle: The right ventricular size is normal. No increase in right ventricular wall thickness. Right ventricular systolic function is normal. There is normal pulmonary artery systolic pressure. The tricuspid regurgitant velocity is 2.42 m/s, and  with an assumed right atrial pressure of 3 mmHg, the estimated right ventricular systolic pressure is 26.4 mmHg. Left Atrium: Left atrial size was mildly dilated. Right Atrium: Right atrial size was normal in size. Pericardium: There is no evidence of pericardial effusion. Mitral Valve: The mitral valve is abnormal. There is mild thickening of the mitral valve leaflet(s). Trivial mitral valve regurgitation. Tricuspid Valve: The tricuspid valve is grossly normal. Tricuspid valve regurgitation is trivial. Aortic Valve: The aortic valve is tricuspid. Aortic valve regurgitation is not visualized. Pulmonic Valve: The pulmonic valve was grossly normal. Pulmonic valve regurgitation is trivial. Aorta: The aortic root and ascending aorta are structurally normal, with no evidence of dilitation. Venous: The inferior vena cava is normal in size with greater than 50% respiratory variability, suggesting right atrial pressure of 3 mmHg. IAS/Shunts: No atrial level shunt detected by color flow Doppler.  LEFT VENTRICLE PLAX 2D LVIDd:         5.20 cm  Diastology LVIDs:         3.80 cm  LV e' lateral:   7.18 cm/s LV PW:         1.20 cm  LV E/e' lateral: 16.0 LV IVS:        1.20 cm  LV e' medial:    7.18 cm/s LVOT diam:     2.20 cm  LV E/e' medial:  16.0 LV SV:         103 LV SV Index:   47 LVOT Area:     3.80 cm  RIGHT VENTRICLE RV S prime:     11.40 cm/s TAPSE (M-mode): 1.5 cm LEFT ATRIUM             Index       RIGHT ATRIUM           Index LA diam:        4.70 cm 2.14 cm/m  RA Area:      20.20 cm LA Vol (A2C):   69.7 ml 31.76 ml/m RA Volume:   58.60 ml  26.70 ml/m LA Vol (A4C):   74.8 ml 34.09 ml/m LA Biplane Vol: 74.5 ml 33.95 ml/m  AORTIC VALVE LVOT Vmax:   141.00 cm/s LVOT Vmean:  91.400 cm/s LVOT VTI:    0.271 m  AORTA Ao Root diam: 3.20 cm MITRAL VALVE                TRICUSPID VALVE  MV Area (PHT): 3.68 cm     TR Peak grad:   23.4 mmHg MV Decel Time: 206 msec     TR Vmax:        242.00 cm/s MV E velocity: 115.00 cm/s MV A velocity: 66.00 cm/s   SHUNTS MV E/A ratio:  1.74         Systemic VTI:  0.27 m                             Systemic Diam: 2.20 cm Zoila Shutter MD Electronically signed by Zoila Shutter MD Signature Date/Time: 12/16/2019/10:33:25 AM    Final     Assessment and Plan:   Sinus Rhythm with Competing Ectopic Atrial Rhythm - Patient admitted for sepsis and we were consulted for atrial fibrillation. However, upon review of EKG and telemetry, looks like sinus rhythm with competing ectopic atrial rhythm.  - Echo showed LVEF of 55-60% with normal wall motion and normal diastolic function.  - High-sensitivity troponin negative x2.  - Rates controlled. Continue Lopressor 25mg  twice daily.  - IV Heparin can be stopped.   CAD - S/p prior stenting to RCA and OM2. - Stable. No angina. - Continue aspirin and statin.  Hypertension - BP well controlled.  - Continue current medications.  Hyperlipidemia - Continue home Crestor 40mg  daily.  Type 2 Diabetes Mellitus - Management per primary team.  Sepsis - Secondary to foot ulcer. - Management per primary team.   For questions or updates, please contact [facility] Please consult [email or web address] for contact info under     Signed, , PA-C  12/16/2019 11:53 AM

## 2019-12-16 NOTE — Consult Note (Signed)
Reason for Consult:Right foot ulcer Referring Physician: Arney Maddox is an 64 y.o. male.  HPI: Chad Maddox was admitted yesterday with sepsis, likely stemming from his right foot ulcer. It began when he stepped on a screw in September and has never healed. He has been seeing what I think is podiatry in [place] without much improvement. He saw [facility] 2w ago and had MRI that was negative for osteo. He was actually in the office to see Dr. Lajoyce Maddox yesterday when he became acutely ill and was brought to the ED. He denies any pain.  Past Medical History:  Diagnosis Date  . Coronary artery disease    s/p NSTEMI 01/2008  -- s/p BMS to RCA 01/2008  --  s/p XIENCE DES TO OM2 12/2008  --  OTW NONOBS DZS AND PATENT RCA STENT, EF 60%   . Diabetes mellitus   . GERD (gastroesophageal reflux disease)   . Hyperlipidemia, mixed   . Hypertension   . Obesity     Past Surgical History:  Procedure Laterality Date  . CARDIAC CATHETERIZATION  12/22/2008    Triple-vessel coronary artery disease -- Patent stent in the proximal right coronary artery -- Severe stenosis of the second obtuse marginal branch -- Successful percutaneous coronary intervention with placement of a Xience drug-eluting stent in the second obtuse marginal branch.  -- Normal left ventricular systolic function    Family History  Problem Relation Age of Onset  . Cerebral aneurysm Mother   . Diabetes Mother   . COPD Father   . Bone cancer Father   . Lung cancer Father   . Lymphoma Sister     Social History:  reports that he has never smoked. He has never used smokeless tobacco. He reports that he does not drink alcohol or use drugs.  Allergies:  Allergies  Allergen Reactions  . Shellfish Allergy Shortness Of Breath and Swelling  . Penicillins Other (See Comments)    Occurred as an infant; reaction unknown.    Medications: I have reviewed the patient's current medications.  Results for orders placed or performed during the  hospital encounter of 12/15/19 (from the past 48 hour(s))  Lactic acid, plasma     Status: Abnormal   Collection Time: 12/15/19  5:30 PM  Result Value Ref Range   Lactic Acid, Venous 2.5 (HH) 0.5 - 1.9 mmol/L    Comment: CRITICAL RESULT CALLED TO, READ BACK BY AND VERIFIED WITH: Claiborne Rigg 12/15/2019 WBOND Performed at [facility], [address] [place], [place] [address]   Blood culture (routine x 2)     Status: None (Preliminary result)   Collection Time: 12/15/19  5:31 PM   Specimen: BLOOD  Result Value Ref Range   Specimen Description BLOOD BLOOD LEFT FOREARM    Special Requests      BOTTLES DRAWN AEROBIC AND ANAEROBIC Blood Culture adequate volume   Culture      NO GROWTH < 24 HOURS Performed at [facility], [address], [place], [place] [address]    Report Status PENDING   Blood culture (routine x 2)     Status: None (Preliminary result)   Collection Time: 12/15/19  5:31 PM   Specimen: BLOOD LEFT HAND  Result Value Ref Range   Specimen Description BLOOD LEFT HAND    Special Requests      BOTTLES DRAWN AEROBIC AND ANAEROBIC Blood Culture adequate volume   Culture      NO GROWTH <  24 HOURS Performed at [facility], [place] [address] [place], [place] [address]    Report Status PENDING   Urinalysis, Routine w reflex microscopic     Status: Abnormal   Collection Time: 12/15/19  5:31 PM  Result Value Ref Range   Color, Urine STRAW (A) YELLOW   APPearance CLEAR CLEAR   Specific Gravity, Urine 1.027 1.005 - 1.030   pH 5.0 5.0 - 8.0   Glucose, UA >=500 (A) NEGATIVE mg/dL   Hgb urine dipstick NEGATIVE NEGATIVE   Bilirubin Urine NEGATIVE NEGATIVE   Ketones, ur 20 (A) NEGATIVE mg/dL   Protein, ur NEGATIVE NEGATIVE mg/dL   Nitrite NEGATIVE NEGATIVE   Leukocytes,Ua NEGATIVE NEGATIVE   RBC / HPF 0-5 0 - 5 RBC/hpf   WBC, UA 0-5 0 - 5 WBC/hpf   Bacteria, UA NONE SEEN NONE SEEN    Comment: Performed at [facility] [address],  [place], [place] [address]  SARS Coronavirus 2 by RT PCR (hospital order, performed in [facility] hospital lab) Nasopharyngeal Nasopharyngeal Swab     Status: None   Collection Time: 12/15/19  5:31 PM   Specimen: Nasopharyngeal Swab  Result Value Ref Range   SARS Coronavirus 2 NEGATIVE NEGATIVE    Comment: (NOTE) SARS-CoV-2 target nucleic acids are NOT DETECTED. The SARS-CoV-2 RNA is generally detectable in upper and lower respiratory specimens during the acute phase of infection. The lowest concentration of SARS-CoV-2 viral copies this assay can detect is 250 copies / mL. A negative result does not preclude SARS-CoV-2 infection and should not be used as the sole basis for treatment or other patient management decisions.  A negative result may occur with improper specimen collection / handling, submission of specimen other than nasopharyngeal swab, presence of viral mutation(s) within the areas targeted by this assay, and inadequate number of viral copies (<250 copies / mL). A negative result must be combined with clinical observations, patient history, and epidemiological information. Fact Sheet for Patients:   StrictlyIdeas.no Fact Sheet for Healthcare Providers: BankingDealers.co.za This test is not yet approved or cleared  by the [place] FDA and has been authorized for detection and/or diagnosis of SARS-CoV-2 by FDA under an Emergency Use Authorization (EUA).  This EUA will remain in effect (meaning this test can be used) for the duration of the COVID-19 declaration under Section 564(b)(1) of the Act, 21 U.S.C. section 360bbb-3(b)(1), unless the authorization is terminated or revoked sooner. Performed at [facility], [place] [address] [place], [place] [address]   Comprehensive metabolic panel     Status: Abnormal   Collection Time: 12/15/19  5:55 PM  Result Value Ref Range   Sodium 132 (L) 135 - 145 mmol/L   Potassium 4.6  3.5 - 5.1 mmol/L   Chloride 95 (L) 98 - 111 mmol/L   CO2 23 22 - 32 mmol/L   Glucose, Bld 294 (H) 70 - 99 mg/dL    Comment: Glucose reference range applies only to samples taken after fasting for at least 8 hours.   BUN 25 (H) 8 - 23 mg/dL   Creatinine, Ser 0.98 0.61 - 1.24 mg/dL   Calcium 9.5 8.9 - 10.3 mg/dL   Total Protein 6.9 6.5 - 8.1 g/dL   Albumin 3.9 3.5 - 5.0 g/dL   AST 19 15 - 41 U/L   ALT 22 0 - 44 U/L   Alkaline Phosphatase 70 38 - 126 U/L   Total Bilirubin 1.0 0.3 - 1.2 mg/dL  GFR calc non Af Amer >60 >60 mL/min   GFR calc Af Amer >60 >60 mL/min   Anion gap 14 5 - 15    Comment: Performed at [facility], [address], [place], [place] [address]  CBC with Differential     Status: Abnormal   Collection Time: 12/15/19  5:55 PM  Result Value Ref Range   WBC 18.8 (H) 4.0 - 10.5 K/uL   RBC 6.87 (H) 4.22 - 5.81 MIL/uL   Hemoglobin 16.8 13.0 - 17.0 g/dL   HCT 37.1 (H) 06.2 - 69.4 %   MCV 77.3 (L) 80.0 - 100.0 fL   MCH 24.5 (L) 26.0 - 34.0 pg   MCHC 31.6 30.0 - 36.0 g/dL   RDW 85.4 (H) 62.7 - 03.5 %   Platelets 212 150 - 400 K/uL   nRBC 0.0 0.0 - 0.2 %   Neutrophils Relative % 87 %   Neutro Abs 16.4 (H) 1.7 - 7.7 K/uL   Lymphocytes Relative 5 %   Lymphs Abs 0.9 0.7 - 4.0 K/uL   Monocytes Relative 7 %   Monocytes Absolute 1.3 (H) 0.1 - 1.0 K/uL   Eosinophils Relative 0 %   Eosinophils Absolute 0.0 0.0 - 0.5 K/uL   Basophils Relative 0 %   Basophils Absolute 0.0 0.0 - 0.1 K/uL   Immature Granulocytes 1 %   Abs Immature Granulocytes 0.15 (H) 0.00 - 0.07 K/uL    Comment: Performed at [facility], [address], [place], [place] [address]  APTT     Status: None   Collection Time: 12/15/19  7:11 PM  Result Value Ref Range   aPTT 26 24 - 36 seconds    Comment: Performed at [facility], [address] [place], [place] [address]  Protime-INR     Status: None   Collection Time: 12/15/19  7:11 PM  Result Value Ref Range   Prothrombin Time 12.8  11.4 - 15.2 seconds   INR 1.0 0.8 - 1.2    Comment: (NOTE) INR goal varies based on device and disease states. Performed at [facility], [address], [place], [place] [address]   Lactic acid, plasma     Status: Abnormal   Collection Time: 12/15/19  7:30 PM  Result Value Ref Range   Lactic Acid, Venous 2.1 (HH) 0.5 - 1.9 mmol/L    Comment: CRITICAL VALUE NOTED.  VALUE IS CONSISTENT WITH PREVIOUSLY REPORTED AND CALLED VALUE. Performed at [facility], [address], [place], [place] [address]   Protime-INR     Status: None   Collection Time: 12/16/19 12:13 AM  Result Value Ref Range   Prothrombin Time 12.9 11.4 - 15.2 seconds   INR 1.0 0.8 - 1.2    Comment: (NOTE) INR goal varies based on device and disease states. Performed at [facility], [address] [place], [place] [address]   Magnesium     Status: Abnormal   Collection Time: 12/16/19 12:13 AM  Result Value Ref Range   Magnesium 1.6 (L) 1.7 - 2.4 mg/dL    Comment: Performed at [facility], [address], [place], [place] [address]  TSH     Status: None   Collection Time: 12/16/19 12:13 AM  Result Value Ref Range   TSH 0.688 0.350 - 4.500 uIU/mL    Comment: Performed by a 3rd Generation assay with a functional sensitivity of <=0.01 uIU/mL. Performed at [facility]  Lab, [address] [place], [place] [address]   Troponin I (High Sensitivity)     Status: None   Collection Time: 12/16/19 12:13 AM  Result Value Ref Range   Troponin I (High Sensitivity) 12 <18 ng/L    Comment: (NOTE) Elevated high sensitivity troponin I (hsTnI) values and significant  changes across serial measurements may suggest ACS but many other  chronic and acute conditions are known to elevate hsTnI results.  Refer to the "Links" section for chest pain algorithms and additional  guidance. Performed at [facility], [address] [place], [place] [address]   Procalcitonin - Baseline     Status: None    Collection Time: 12/16/19 12:13 AM  Result Value Ref Range   Procalcitonin 0.87 ng/mL    Comment:        Interpretation: PCT > 0.5 ng/mL and <= 2 ng/mL: Systemic infection (sepsis) is possible, but other conditions are known to elevate PCT as well. (NOTE)       Sepsis PCT Algorithm           Lower Respiratory Tract                                      Infection PCT Algorithm    ----------------------------     ----------------------------         PCT < 0.25 ng/mL                PCT < 0.10 ng/mL         Strongly encourage             Strongly discourage   discontinuation of antibiotics    initiation of antibiotics    ----------------------------     -----------------------------       PCT 0.25 - 0.50 ng/mL            PCT 0.10 - 0.25 ng/mL               OR       >80% decrease in PCT            Discourage initiation of                                            antibiotics      Encourage discontinuation           of antibiotics    ----------------------------     -----------------------------         PCT >= 0.50 ng/mL              PCT 0.26 - 0.50 ng/mL                AND       <80% decrease in PCT             Encourage initiation of                                             antibiotics       Encourage continuation           of antibiotics    ----------------------------     -----------------------------  PCT >= 0.50 ng/mL                  PCT > 0.50 ng/mL               AND         increase in PCT                  Strongly encourage                                      initiation of antibiotics    Strongly encourage escalation           of antibiotics                                     -----------------------------                                           PCT <= 0.25 ng/mL                                                 OR                                        > 80% decrease in PCT                                     Discontinue / Do not initiate                                              antibiotics Performed at [facility], [address] [place], [place] [address]   CBG monitoring, ED     Status: Abnormal   Collection Time: 12/16/19  1:12 AM  Result Value Ref Range   Glucose-Capillary 288 (H) 70 - 99 mg/dL    Comment: Glucose reference range applies only to samples taken after fasting for at least 8 hours.  Troponin I (High Sensitivity)     Status: None   Collection Time: 12/16/19  1:55 AM  Result Value Ref Range   Troponin I (High Sensitivity) 11 <18 ng/L    Comment: (NOTE) Elevated high sensitivity troponin I (hsTnI) values and significant  changes across serial measurements may suggest ACS but many other  chronic and acute conditions are known to elevate hsTnI results.  Refer to the "Links" section for chest pain algorithms and additional  guidance. Performed at [facility], [address] [place], [place] [address]   CBG monitoring, ED     Status: Abnormal   Collection Time: 12/16/19  1:55 AM  Result Value Ref Range   Glucose-Capillary 243 (H) 70 - 99 mg/dL    Comment: Glucose reference range applies only to samples taken after fasting for  at least 8 hours.  MRSA PCR Screening     Status: None   Collection Time: 12/16/19  2:39 AM   Specimen: Nasal Mucosa; Nasopharyngeal  Result Value Ref Range   MRSA by PCR NEGATIVE NEGATIVE    Comment:        The GeneXpert MRSA Assay (FDA approved for NASAL specimens only), is one component of a comprehensive MRSA colonization surveillance program. It is not intended to diagnose MRSA infection nor to guide or monitor treatment for MRSA infections. Performed at [facility], [address], [place], [place] [address]   HIV Antibody (routine testing w rflx)     Status: None   Collection Time: 12/16/19  6:48 AM  Result Value Ref Range   HIV Screen 4th Generation wRfx Non Reactive Non Reactive    Comment: Performed at [facility], [address], [place],  [place] [address]  Heparin level (unfractionated)     Status: Abnormal   Collection Time: 12/16/19  6:48 AM  Result Value Ref Range   Heparin Unfractionated 0.28 (L) 0.30 - 0.70 IU/mL    Comment: (NOTE) If heparin results are below expected values, and patient dosage has  been confirmed, suggest follow up testing of antithrombin III levels. Performed at [facility], [address], [place], [place] [address]   CBC     Status: Abnormal   Collection Time: 12/16/19  6:48 AM  Result Value Ref Range   WBC 16.2 (H) 4.0 - 10.5 K/uL   RBC 6.08 (H) 4.22 - 5.81 MIL/uL   Hemoglobin 15.0 13.0 - 17.0 g/dL   HCT 03.4 74.2 - 59.5 %   MCV 77.6 (L) 80.0 - 100.0 fL   MCH 24.7 (L) 26.0 - 34.0 pg   MCHC 31.8 30.0 - 36.0 g/dL   RDW 63.8 (H) 75.6 - 43.3 %   Platelets 179 150 - 400 K/uL   nRBC 0.0 0.0 - 0.2 %    Comment: Performed at [facility], [address], [place], [place] [address]  Glucose, capillary     Status: Abnormal   Collection Time: 12/16/19  8:53 AM  Result Value Ref Range   Glucose-Capillary 272 (H) 70 - 99 mg/dL    Comment: Glucose reference range applies only to samples taken after fasting for at least 8 hours.    DG Chest 1 View  Result Date: 12/15/2019 CLINICAL DATA:  Patient with wound infection of the foot. EXAM: CHEST  1 VIEW COMPARISON:  Chest radiograph 01/19/2008. FINDINGS: Monitoring leads overlie the patient. Stable cardiac and mediastinal contours. Pulmonary vascular redistribution. No pleural effusion or pneumothorax. IMPRESSION: Mild cardiomegaly with pulmonary vascular redistribution. Electronically Signed   By: Annia Belt M.D.   On: 12/15/2019 18:23   DG Foot Complete Right  Result Date: 12/15/2019 CLINICAL DATA:  Wound infection plantar aspect right first metatarsophalangeal joint, fever, nausea EXAM: RIGHT FOOT COMPLETE - 3+ VIEW COMPARISON:  12/01/2019, 11/29/2019 FINDINGS: Frontal, oblique, lateral views of the right foot are obtained. There is progressive  soft tissue swelling plantar aspect first digit at the level of the metatarsophalangeal joint. Soft tissue ulceration is again noted. There is no underlying bony destruction or periosteal reaction to suggest osteomyelitis. No fracture, subluxation, or dislocation. Joint spaces are well preserved. IMPRESSION: 1. Increasing soft tissue swelling at the site of right first digit soft tissue ulceration. 2. No evidence of osteomyelitis. Electronically Signed   By: Sharlet Salina M.D.   On: 12/15/2019  18:22   ECHOCARDIOGRAM COMPLETE  Result Date: 12/16/2019    ECHOCARDIOGRAM REPORT   Patient Name:   Chad Maddox Date of Exam: 12/16/2019 Medical Rec #:  [id number]      Height:       70.0 in Accession #:    [id number]     Weight:       225.0 lb Date of Birth:  [birth year]      BSA:          2.194 m Patient Age:    64 years       BP:           113/78 mmHg Patient Gender: M              HR:           79 bpm. Exam Location:  Inpatient Procedure: 2D Echo Indications:    Atrial Fibrillation 427.31 / I48.91  History:        Patient has no prior history of Echocardiogram examinations.                 CAD; Risk Factors:Hypertension, Diabetes and Dyslipidemia.  Sonographer:    Leeroy Bock Turrentine Referring Phys: [id number] ARSHAD N KAKRAKANDY IMPRESSIONS  1. Left ventricular ejection fraction, by estimation, is 55 to 60%. The left ventricle has normal function. The left ventricle has no regional wall motion abnormalities. There is mild left ventricular hypertrophy. Left ventricular diastolic parameters were normal.  2. Right ventricular systolic function is normal. The right ventricular size is normal. There is normal pulmonary artery systolic pressure. The estimated right ventricular systolic pressure is 26.4 mmHg.  3. Left atrial size was mildly dilated.  4. The mitral valve is abnormal. Trivial mitral valve regurgitation.  5. The aortic valve is tricuspid. Aortic valve regurgitation is not visualized.  6. The inferior vena cava is  normal in size with greater than 50% respiratory variability, suggesting right atrial pressure of 3 mmHg. FINDINGS  Left Ventricle: Left ventricular ejection fraction, by estimation, is 55 to 60%. The left ventricle has normal function. The left ventricle has no regional wall motion abnormalities. The left ventricular internal cavity size was normal in size. There is  mild left ventricular hypertrophy. Left ventricular diastolic parameters were normal. Right Ventricle: The right ventricular size is normal. No increase in right ventricular wall thickness. Right ventricular systolic function is normal. There is normal pulmonary artery systolic pressure. The tricuspid regurgitant velocity is 2.42 m/s, and  with an assumed right atrial pressure of 3 mmHg, the estimated right ventricular systolic pressure is 26.4 mmHg. Left Atrium: Left atrial size was mildly dilated. Right Atrium: Right atrial size was normal in size. Pericardium: There is no evidence of pericardial effusion. Mitral Valve: The mitral valve is abnormal. There is mild thickening of the mitral valve leaflet(s). Trivial mitral valve regurgitation. Tricuspid Valve: The tricuspid valve is grossly normal. Tricuspid valve regurgitation is trivial. Aortic Valve: The aortic valve is tricuspid. Aortic valve regurgitation is not visualized. Pulmonic Valve: The pulmonic valve was grossly normal. Pulmonic valve regurgitation is trivial. Aorta: The aortic root and ascending aorta are structurally normal, with no evidence of dilitation. Venous: The inferior vena cava is normal in size with greater than 50% respiratory variability, suggesting right atrial pressure of 3 mmHg. IAS/Shunts: No atrial level shunt detected by color flow Doppler.  LEFT VENTRICLE PLAX 2D LVIDd:         5.20 cm  Diastology LVIDs:  3.80 cm  LV e' lateral:   7.18 cm/s LV PW:         1.20 cm  LV E/e' lateral: 16.0 LV IVS:        1.20 cm  LV e' medial:    7.18 cm/s LVOT diam:     2.20 cm  LV  E/e' medial:  16.0 LV SV:         103 LV SV Index:   47 LVOT Area:     3.80 cm  RIGHT VENTRICLE RV S prime:     11.40 cm/s TAPSE (M-mode): 1.5 cm LEFT ATRIUM             Index       RIGHT ATRIUM           Index LA diam:        4.70 cm 2.14 cm/m  RA Area:     20.20 cm LA Vol (A2C):   69.7 ml 31.76 ml/m RA Volume:   58.60 ml  26.70 ml/m LA Vol (A4C):   74.8 ml 34.09 ml/m LA Biplane Vol: 74.5 ml 33.95 ml/m  AORTIC VALVE LVOT Vmax:   141.00 cm/s LVOT Vmean:  91.400 cm/s LVOT VTI:    0.271 m  AORTA Ao Root diam: 3.20 cm MITRAL VALVE                TRICUSPID VALVE MV Area (PHT): 3.68 cm     TR Peak grad:   23.4 mmHg MV Decel Time: 206 msec     TR Vmax:        242.00 cm/s MV E velocity: 115.00 cm/s MV A velocity: 66.00 cm/s   SHUNTS MV E/A ratio:  1.74         Systemic VTI:  0.27 m                             Systemic Diam: 2.20 cm Zoila Shutter MD Electronically signed by Zoila Shutter MD Signature Date/Time: 12/16/2019/10:33:25 AM    Final     Review of Systems  Constitutional: Positive for fever. Negative for chills and diaphoresis.  HENT: Negative for ear discharge, ear pain, hearing loss and tinnitus.   Eyes: Negative for photophobia and pain.  Respiratory: Negative for cough and shortness of breath.   Cardiovascular: Negative for chest pain.  Gastrointestinal: Positive for nausea and vomiting. Negative for abdominal pain.  Genitourinary: Negative for dysuria, flank pain, frequency and urgency.  Musculoskeletal: Negative for back pain, myalgias and neck pain.  Neurological: Negative for dizziness and headaches.  Hematological: Does not bruise/bleed easily.  Psychiatric/Behavioral: The patient is not nervous/anxious.    Blood pressure 123/77, pulse 74, temperature 97.6 F (36.4 C), temperature source Oral, resp. rate (!) 24, height 5\' 10"  (1.778 m), weight 102.1 kg, SpO2 96 %. Physical Exam  Constitutional: He appears well-developed and well-nourished. No distress.  HENT:  Head:  Normocephalic and atraumatic.  Eyes: Conjunctivae are normal. Right eye exhibits no discharge. Left eye exhibits no discharge. No scleral icterus.  Cardiovascular: Normal rate and regular rhythm.  Respiratory: Effort normal. No respiratory distress.  Musculoskeletal:     Cervical back: Normal range of motion.     Comments: RLE No traumatic wounds, ecchymosis, or rash  Ulceration over plantar MTP joint with purulent discharge, no TTP  No knee or ankle effusion  Knee stable to varus/ valgus and anterior/posterior stress  Sens DPN, SPN, TN intact  Motor  EHL, ext, flex, evers 5/5  DP 1+, PT 0, No significant edema  Neurological: He is alert.  Skin: Skin is warm and dry. He is not diaphoretic.  Psychiatric: He has a normal mood and affect. His behavior is normal.    Assessment/Plan: Right foot ulcer -- Will check ABI's to make sure there's no reversible arterial disease. Dr. Lajoyce Maddox to evaluate later today or in AM. Suspect plan will be antibiotic suppression of current infection with outpatient follow up. Multiple medical problems including CAD, DM, and HLD -- per primary service    Freeman Caldron, PA-C Orthopedic Surgery [phone] 12/16/2019, 10:50 AM

## 2019-12-16 NOTE — Progress Notes (Signed)
  Echocardiogram 2D Echocardiogram has been performed.  Lorene Klimas A Felicity Penix 12/16/2019, 8:21 AM

## 2019-12-16 NOTE — Progress Notes (Signed)
Inpatient Diabetes Program Recommendations  AACE/ADA: New Consensus Statement on Inpatient Glycemic Control (2015)  Target Ranges:  Prepandial:   less than 140 mg/dL      Peak postprandial:   less than 180 mg/dL (1-2 hours)      Critically ill patients:  140 - 180 mg/dL   Lab Results  Component Value Date   GLUCAP 272 (H) 12/16/2019   HGBA1C 11.0 (H) 11/29/2019    Review of Glycemic Control Results for Chad Maddox, Chad Maddox (MRN [id number]) as of 12/16/2019 11:20  Ref. Range 12/16/2019 01:12 12/16/2019 01:55 12/16/2019 08:53  Glucose-Capillary Latest Ref Range: 70 - 99 mg/dL 553 (H) 748 (H) 270 (H)   Diabetes history: DM2 Outpatient Diabetes medications: Glucotrol 10 mg + Metformin 1 gm bid Current orders for Inpatient glycemic control: Lantus 10 units q hs + Novolog sensitive correction tid  Inpatient Diabetes Program Recommendations:   -Increase Lantus to 15 units q hs -Add Novolog 4 units tid meal coverage if eats 50% meals -Add Novolog hs correction 0-5 units Noted A1v was 11.7 08/18/19 and currently 11.0. Ordered Living well With Diabetes book.  Thank you, Billy Fischer. Hanks, RN, MSN, CDE  Diabetes Coordinator Inpatient Glycemic Control Team Team Pager [phone] (8am-5pm) 12/16/2019 11:30 AM

## 2019-12-16 NOTE — Progress Notes (Signed)
PHARMACY - PHYSICIAN COMMUNICATION CRITICAL VALUE ALERT - BLOOD CULTURE IDENTIFICATION (BCID)  Chad Maddox is an 64 y.o. male who presented to [facility] on 12/15/2019 with a chief complaint of fever.   Assessment:  Admitted yesterday with sepsis, likely stemming from his right foot ulcer. It began when he stepped on a screw in September and has never healed. Today, patient is afebrile, WBC down to 16.2 from 18.8 on admission. Blood culture growing GBS in 1/4 bottles.   Name of physician (or Provider) Contacted: Dr. Catha Gosselin  Current antibiotics: Cefepime 2 gm q8hr, Flagyl 500 mg IV q8hr, Vancomycin 1,000 mg q12hrs  Changes to prescribed antibiotics recommended:  Patient is on recommended antibiotics - No changes needed  Results for orders placed or performed during the hospital encounter of 12/15/19  Blood Culture ID Panel (Reflexed) (Collected: 12/15/2019  5:31 PM)  Result Value Ref Range   Enterococcus species NOT DETECTED NOT DETECTED   Listeria monocytogenes NOT DETECTED NOT DETECTED   Staphylococcus species NOT DETECTED NOT DETECTED   Staphylococcus aureus (BCID) NOT DETECTED NOT DETECTED   Streptococcus species DETECTED (A) NOT DETECTED   Streptococcus agalactiae DETECTED (A) NOT DETECTED   Streptococcus pneumoniae NOT DETECTED NOT DETECTED   Streptococcus pyogenes NOT DETECTED NOT DETECTED   Acinetobacter baumannii NOT DETECTED NOT DETECTED   Enterobacteriaceae species NOT DETECTED NOT DETECTED   Enterobacter cloacae complex NOT DETECTED NOT DETECTED   Escherichia coli NOT DETECTED NOT DETECTED   Klebsiella oxytoca NOT DETECTED NOT DETECTED   Klebsiella pneumoniae NOT DETECTED NOT DETECTED   Proteus species NOT DETECTED NOT DETECTED   Serratia marcescens NOT DETECTED NOT DETECTED   Haemophilus influenzae NOT DETECTED NOT DETECTED   Neisseria meningitidis NOT DETECTED NOT DETECTED   Pseudomonas aeruginosa NOT DETECTED NOT DETECTED   Candida albicans NOT DETECTED NOT  DETECTED   Candida glabrata NOT DETECTED NOT DETECTED   Candida krusei NOT DETECTED NOT DETECTED   Candida parapsilosis NOT DETECTED NOT DETECTED   Candida tropicalis NOT DETECTED NOT DETECTED   Charlett Nose, PharmD  [id number] Acute Care Pharmacy Resident 12/16/2019  2:05 PM

## 2019-12-17 ENCOUNTER — Inpatient Hospital Stay (HOSPITAL_COMMUNITY): Payer: Medicare Other

## 2019-12-17 DIAGNOSIS — L03115 Cellulitis of right lower limb: Secondary | ICD-10-CM

## 2019-12-17 DIAGNOSIS — E1169 Type 2 diabetes mellitus with other specified complication: Secondary | ICD-10-CM

## 2019-12-17 DIAGNOSIS — I4891 Unspecified atrial fibrillation: Secondary | ICD-10-CM

## 2019-12-17 DIAGNOSIS — A419 Sepsis, unspecified organism: Principal | ICD-10-CM

## 2019-12-17 DIAGNOSIS — E669 Obesity, unspecified: Secondary | ICD-10-CM

## 2019-12-17 LAB — BASIC METABOLIC PANEL
Anion gap: 8 (ref 5–15)
BUN: 18 mg/dL (ref 8–23)
CO2: 19 mmol/L — ABNORMAL LOW (ref 22–32)
Calcium: 8.1 mg/dL — ABNORMAL LOW (ref 8.9–10.3)
Chloride: 105 mmol/L (ref 98–111)
Creatinine, Ser: 0.71 mg/dL (ref 0.61–1.24)
GFR calc Af Amer: >60 mL/min (ref >60)
GFR calc non Af Amer: >60 mL/min (ref >60)
Glucose, Bld: 230 mg/dL — ABNORMAL HIGH (ref 70–99)
Potassium: 4.1 mmol/L (ref 3.5–5.1)
Sodium: 132 mmol/L — ABNORMAL LOW (ref 135–145)

## 2019-12-17 LAB — GLUCOSE, CAPILLARY
Glucose-Capillary: 178 mg/dL — ABNORMAL HIGH (ref 70–99)
Glucose-Capillary: 195 mg/dL — ABNORMAL HIGH (ref 70–99)
Glucose-Capillary: 207 mg/dL — ABNORMAL HIGH (ref 70–99)

## 2019-12-17 LAB — CBC
HCT: 43.5 % (ref 39.0–52.0)
Hemoglobin: 13.5 g/dL (ref 13.0–17.0)
MCH: 24.2 pg — ABNORMAL LOW (ref 26.0–34.0)
MCHC: 31 g/dL (ref 30.0–36.0)
MCV: 78.1 fL — ABNORMAL LOW (ref 80.0–100.0)
Platelets: 153 10*3/uL (ref 150–400)
RBC: 5.57 MIL/uL (ref 4.22–5.81)
RDW: 15.8 % — ABNORMAL HIGH (ref 11.5–15.5)
WBC: 9.3 10*3/uL (ref 4.0–10.5)
nRBC: 0 % (ref 0.0–0.2)

## 2019-12-17 LAB — URINE CULTURE

## 2019-12-17 MED ORDER — ENOXAPARIN SODIUM 40 MG/0.4ML ~~LOC~~ SOLN
40.0000 mg | Freq: Every day | SUBCUTANEOUS | Status: DC
  Administered 2019-12-17 – 2019-12-18 (×2): 40 mg via SUBCUTANEOUS
  Filled 2019-12-17 (×2): qty 0.4

## 2019-12-17 MED ORDER — CEFAZOLIN SODIUM-DEXTROSE 2-4 GM/100ML-% IV SOLN
2.0000 g | Freq: Three times a day (TID) | INTRAVENOUS | Status: DC
  Administered 2019-12-17 – 2019-12-18 (×4): 2 g via INTRAVENOUS
  Filled 2019-12-17 (×4): qty 100

## 2019-12-17 NOTE — Plan of Care (Signed)

## 2019-12-17 NOTE — Progress Notes (Signed)
PROGRESS NOTE    Chad Maddox  IRS:[id number] DOB: [birth year] DOA: 12/15/2019 PCP: System, Pcp Not In   Brief Narrative:  HPI on 12/15/2019 by Dr. Gean Birchwood Chad Maddox is a 64 y.o. 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  Patient has been a ulceration since September 2020.  Last week patient had an MRI done which shows features consistent with cellulitis with no bony involvement.  Today patient was following up with orthopedics and was waiting in the office when patient became febrile tachycardic and had nausea with 1 episode of vomiting.  Denies any abdominal pain or diarrhea.  Given the symptoms patient was brought to the ER.  Patient denies any chest pain or shortness of breath or productive cough. Assessment & Plan   Sepsis possibly secondary to right foot ulceration and cellulitis/bactermia -Present on admission, noted to have fever of 101.7 F, tachycardia, tachypnea with leukocytosis -Cellulitis order set as well as sepsis order set utilized -Patient states he has been seeing a foot doctor since September 2020 for which she sees him every week.  He states it has not gotten any better.  He noted that he had a fever prior to admission and started feeling poorly. -Chest x-ray and UA unremarkable for infection -COVID-19 negative -Right foot x-ray showed increased soft tissue swelling at the right first digit soft tissue ulceration.  No evidence of osteomyelitis -MRI of the right foot on 12/01/2019 (see prior to admission) showed a superficial soft tissue wound/ulceration with on cellulitis underlying the plantar aspect of the first metatarsal head.  No evidence of osteomyelitis. -Continue IV fluids -Blood cultures strep agalactiae in anaerobic bottle only  -was on IV antibiotics, vancomycin, Flagyl,  cefepime- however given blood culture results, will transition to cefazolin and repeat blood cultures -Orthopedic surgery consulted and appreciated- recommended Darco shoe, no weight bearing on the forefoot. Dry dressing changes. Follow up with Dr. Sharol Given in one week -Continue IV morphine as well as hydrocodone for pain control  Sinus rhythm with competing ectopic atrial rhythm  -Heart rate appears to be rate controlled at this time -Appears to be in sinus rhythm -CHA2DS2-VASc 2 (HTN, DM) -EKG shows SR with 1AVB, PVC -Echocardiogram obtained showing an EF of 55 to 60%, mild LVH.  LV diastolic parameters normal. -it seems that on admission, it seems that patient was thought to have new onset atrial fibrillation. -Cardiology consulted and appreciated, and feels that this is atrial fibrillation but more of a sinus arrhythmia versus competing atrial pacemaker.  No evidence of atrial fibrillation, heparin was discontinued.  No further cardiac work-up indicated.  Diabetes mellitus, type II-uncontrolled with hyperglycemia  -Continue Lantus, insulin sliding scale and CBG monitoring -Home medications of Invokana, glipizide, Metformin held -Last hemoglobin A1c on 11/29/2019 was 11  Essential hypertension -Continue lisinopril, metoprolol  History of CAD -Currently denies any chest pain -Continue aspirin, statin, lisinopril, metoprolol  Umbilical hernia  -Continue to monitor  DVT Prophylaxis  Heparin  Code Status: Full  Family Communication: None at bedside  Disposition Plan:  Status is: Inpatient  Remains inpatient appropriate because:Hemodynamically unstable, IV treatments appropriate due to intensity of illness or inability to take PO and Inpatient level of care appropriate due to severity of illness   Dispo: The patient is from: Home  Anticipated d/c is to: Home              Anticipated d/c date is: 1 day              Patient currently is not medically stable to  d/c.  Consultants Orthopedic surgery Cardiology  Procedures  Echocardiogram  Antibiotics   Anti-infectives (From admission, onward)   Start     Dose/Rate Route Frequency Ordered Stop   12/17/19 1400  ceFAZolin (ANCEF) IVPB 2g/100 mL premix     2 g 200 mL/hr over 30 Minutes Intravenous Every 8 hours 12/17/19 1018     12/16/19 1000  vancomycin (VANCOCIN) IVPB 1000 mg/200 mL premix  Status:  Discontinued     1,000 mg 200 mL/hr over 60 Minutes Intravenous Every 12 hours 12/15/19 1940 12/17/19 1018   12/16/19 0730  ceFEPIme (MAXIPIME) 2 g in sodium chloride 0.9 % 100 mL IVPB  Status:  Discontinued     2 g 200 mL/hr over 30 Minutes Intravenous Every 8 hours 12/15/19 2217 12/17/19 1018   12/15/19 2230  metroNIDAZOLE (FLAGYL) IVPB 500 mg  Status:  Discontinued     500 mg 100 mL/hr over 60 Minutes Intravenous Every 8 hours 12/15/19 2211 12/17/19 1018   12/15/19 2215  ceFEPIme (MAXIPIME) 2 g in sodium chloride 0.9 % 100 mL IVPB     2 g 200 mL/hr over 30 Minutes Intravenous  Once 12/15/19 2211 12/16/19 0437   12/15/19 2215  vancomycin (VANCOCIN) IVPB 1000 mg/200 mL premix  Status:  Discontinued     1,000 mg 200 mL/hr over 60 Minutes Intravenous  Once 12/15/19 2211 12/15/19 2216   12/15/19 2000  cefTRIAXone (ROCEPHIN) 1 g in sodium chloride 0.9 % 100 mL IVPB  Status:  Discontinued     1 g 200 mL/hr over 30 Minutes Intravenous Every 24 hours 12/15/19 1936 12/15/19 2211   12/15/19 2000  vancomycin (VANCOREADY) IVPB 2000 mg/400 mL     2,000 mg 200 mL/hr over 120 Minutes Intravenous  Once 12/15/19 1937 12/16/19 0012   12/15/19 1915  vancomycin (VANCOCIN) IVPB 1000 mg/200 mL premix  Status:  Discontinued     1,000 mg 200 mL/hr over 60 Minutes Intravenous  Once 12/15/19 1913 12/15/19 1937   12/15/19 1915  levofloxacin (LEVAQUIN) IVPB 750 mg  Status:  Discontinued     750 mg 100 mL/hr over 90 Minutes Intravenous  Once 12/15/19 1913 12/15/19 1932      Subjective:   Chad Maddox seen and  examined today.  Continues to complain of right foot pain but states that the pain medications do help.  He denies current chest pain or shortness of breath, abdominal pain, nausea or vomiting, diarrhea or constipation, dizziness or headache.    Objective:   Vitals:   12/16/19 2353 12/17/19 0425 12/17/19 0730 12/17/19 0858  BP: 94/72 (!) 107/59  131/69  Pulse: 67 81  65  Resp: Temp: 98.1 F (36.7 C) 98.1 F (36.7 C)  98 F (36.7 C)  TempSrc: Oral Oral  Oral  SpO2: 97% 96% 99% 95%  Weight:      Height:        Intake/Output Summary (Last 24 hours) at 12/17/2019 1021 Last data filed at 12/17/2019 0900 Gross per 24 hour  Intake 4252.94 ml  Output 2950 ml  Net 1302.94 ml   Filed Weights   12/15/19 1726  Weight: 102.1 kg   Exam  General: Well developed, well nourished, NAD, appears  stated age  HEENT: NCAT, mucous membranes moist.   Cardiovascular: S1 S2 auscultated, RRR  Respiratory: Clear to auscultation bilaterally with equal chest rise  Abdomen: Soft, nontender, nondistended, + bowel sounds  Extremities: warm dry without cyanosis clubbing.  Bilateral pedal edema.  Dressing on right foot  Neuro: AAOx3, nonfocal  Psych: Normal affect and demeanor with intact judgement and insight  Data Reviewed: I have personally reviewed following labs and imaging studies  CBC: Recent Labs  Lab 12/15/19 1755 12/16/19 0648 12/17/19 0214  WBC 18.8* 16.2* 9.3  NEUTROABS 16.4*  --   --   HGB 16.8 15.0 13.5  HCT 53.1* 47.2 43.5  MCV 77.3* 77.6* 78.1*  PLT 212 179 153   Basic Metabolic Panel: Recent Labs  Lab 12/15/19 1755 12/16/19 0013 12/16/19 0155 12/17/19 0214  NA 132*  --   --  132*  K 4.6  --   --  4.1  CL 95*  --   --  105  CO2 23  --   --  19*  GLUCOSE 294*  --   --  230*  BUN 25*  --   --  18  CREATININE 0.98  --   --  0.71  CALCIUM 9.5  --   --  8.1*  MG  --  1.6* 1.6*  --    GFR: Estimated Creatinine Clearance: 113.1 mL/min (by C-G formula  based on SCr of 0.71 mg/dL). Liver Function Tests: Recent Labs  Lab 12/15/19 1755  AST 19  ALT 22  ALKPHOS 70  BILITOT 1.0  PROT 6.9  ALBUMIN 3.9   No results for input(s): LIPASE, AMYLASE in the last 168 hours. No results for input(s): AMMONIA in the last 168 hours. Coagulation Profile: Recent Labs  Lab 12/15/19 1911 12/16/19 0013  INR 1.0 1.0   Cardiac Enzymes: No results for input(s): CKTOTAL, CKMB, CKMBINDEX, TROPONINI in the last 168 hours. BNP (last 3 results) No results for input(s): PROBNP in the last 8760 hours. HbA1C: No results for input(s): HGBA1C in the last 72 hours. CBG: Recent Labs  Lab 12/16/19 0853 12/16/19 1147 12/16/19 1607 12/16/19 2134 12/17/19 0658  GLUCAP 272* 276* 187* 198* 178*   Lipid Profile: No results for input(s): CHOL, HDL, LDLCALC, TRIG, CHOLHDL, LDLDIRECT in the last 72 hours. Thyroid Function Tests: Recent Labs    12/16/19 0013  TSH 0.688   Anemia Panel: No results for input(s): VITAMINB12, FOLATE, FERRITIN, TIBC, IRON, RETICCTPCT in the last 72 hours. Urine analysis:    Component Value Date/Time   COLORURINE STRAW (A) 12/15/2019 1731   APPEARANCEUR CLEAR 12/15/2019 1731   LABSPEC 1.027 12/15/2019 1731   PHURINE 5.0 12/15/2019 1731   GLUCOSEU >=500 (A) 12/15/2019 1731   HGBUR NEGATIVE 12/15/2019 1731   BILIRUBINUR NEGATIVE 12/15/2019 1731   KETONESUR 20 (A) 12/15/2019 1731   PROTEINUR NEGATIVE 12/15/2019 1731   UROBILINOGEN 0.2 01/18/2008 1450   NITRITE NEGATIVE 12/15/2019 1731   LEUKOCYTESUR NEGATIVE 12/15/2019 1731   Sepsis Labs: @LABRCNTIP (procalcitonin:4,lacticidven:4)  ) Recent Results (from the past 240 hour(s))  Blood culture (routine x 2)     Status: Abnormal (Preliminary result)   Collection Time: 12/15/19  5:31 PM   Specimen: BLOOD  Result Value Ref Range Status   Specimen Description BLOOD BLOOD LEFT FOREARM  Final   Special Requests   Final    BOTTLES DRAWN AEROBIC AND ANAEROBIC Blood Culture  adequate volume   Culture  Setup Time   Final    GRAM POSITIVE COCCI  IN CLUSTERS ANAEROBIC BOTTLE ONLY CRITICAL RESULT CALLED TO, READ BACK BY AND VERIFIED WITH: PHARMD M GIBBS [id number] FCP    Culture (A)  Final    GROUP B STREP(S.AGALACTIAE)ISOLATED SUSCEPTIBILITIES TO FOLLOW Performed at [facility], [address] [place], [place] [address]    Report Status PENDING  Incomplete  Blood culture (routine x 2)     Status: None (Preliminary result)   Collection Time: 12/15/19  5:31 PM   Specimen: BLOOD LEFT HAND  Result Value Ref Range Status   Specimen Description BLOOD LEFT HAND  Final   Special Requests   Final    BOTTLES DRAWN AEROBIC AND ANAEROBIC Blood Culture adequate volume   Culture   Final    NO GROWTH 2 DAYS Performed at [facility], [address], [place], [place] [address]    Report Status PENDING  Incomplete  SARS Coronavirus 2 by RT PCR (hospital order, performed in [facility] hospital lab) Nasopharyngeal Nasopharyngeal Swab     Status: None   Collection Time: 12/15/19  5:31 PM   Specimen: Nasopharyngeal Swab  Result Value Ref Range Status   SARS Coronavirus 2 NEGATIVE NEGATIVE Final    Comment: (NOTE) SARS-CoV-2 target nucleic acids are NOT DETECTED. The SARS-CoV-2 RNA is generally detectable in upper and lower respiratory specimens during the acute phase of infection. The lowest concentration of SARS-CoV-2 viral copies this assay can detect is 250 copies / mL. A negative result does not preclude SARS-CoV-2 infection and should not be used as the sole basis for treatment or other patient management decisions.  A negative result may occur with improper specimen collection / handling, submission of specimen other than nasopharyngeal swab, presence of viral mutation(s) within the areas targeted by this assay, and inadequate number of viral copies (<250 copies / mL). A negative result must be combined with clinical observations, patient history,  and epidemiological information. Fact Sheet for Patients:   BoilerBrush.com.cy Fact Sheet for Healthcare Providers: https://pope.com/ This test is not yet approved or cleared  by the [place] FDA and has been authorized for detection and/or diagnosis of SARS-CoV-2 by FDA under an Emergency Use Authorization (EUA).  This EUA will remain in effect (meaning this test can be used) for the duration of the COVID-19 declaration under Section 564(b)(1) of the Act, 21 U.S.C. section 360bbb-3(b)(1), unless the authorization is terminated or revoked sooner. Performed at [facility], [address] [place], [place] [address]   Blood Culture ID Panel (Reflexed)     Status: Abnormal   Collection Time: 12/15/19  5:31 PM  Result Value Ref Range Status   Enterococcus species NOT DETECTED NOT DETECTED Final   Listeria monocytogenes NOT DETECTED NOT DETECTED Final   Staphylococcus species NOT DETECTED NOT DETECTED Final   Staphylococcus aureus (BCID) NOT DETECTED NOT DETECTED Final   Streptococcus species DETECTED (A) NOT DETECTED Final    Comment: CRITICAL RESULT CALLED TO, READ BACK BY AND VERIFIED WITH: PHARMD M GIBBS [id number] FCP    Streptococcus agalactiae DETECTED (A) NOT DETECTED Final    Comment: CRITICAL RESULT CALLED TO, READ BACK BY AND VERIFIED WITH: PHARMD M GIBBS [id number] 1403 FCP    Streptococcus pneumoniae NOT DETECTED NOT DETECTED Final   Streptococcus pyogenes NOT DETECTED NOT DETECTED Final   Acinetobacter baumannii NOT DETECTED NOT DETECTED Final   Enterobacteriaceae species NOT DETECTED NOT DETECTED Final   Enterobacter cloacae complex NOT DETECTED NOT DETECTED Final   Escherichia coli  NOT DETECTED NOT DETECTED Final   Klebsiella oxytoca NOT DETECTED NOT DETECTED Final   Klebsiella pneumoniae NOT DETECTED NOT DETECTED Final   Proteus species NOT DETECTED NOT DETECTED Final   Serratia marcescens NOT DETECTED NOT  DETECTED Final   Haemophilus influenzae NOT DETECTED NOT DETECTED Final   Neisseria meningitidis NOT DETECTED NOT DETECTED Final   Pseudomonas aeruginosa NOT DETECTED NOT DETECTED Final   Candida albicans NOT DETECTED NOT DETECTED Final   Candida glabrata NOT DETECTED NOT DETECTED Final   Candida krusei NOT DETECTED NOT DETECTED Final   Candida parapsilosis NOT DETECTED NOT DETECTED Final   Candida tropicalis NOT DETECTED NOT DETECTED Final    Comment: Performed at [facility], [address] [place], [place] [address]  Urine culture     Status: Abnormal   Collection Time: 12/15/19  7:11 PM   Specimen: In/Out Cath Urine  Result Value Ref Range Status   Specimen Description IN/OUT CATH URINE  Final   Special Requests   Final    NONE Performed at [facility], [address] [place], [place] [address]    Culture MULTIPLE SPECIES PRESENT, SUGGEST RECOLLECTION (A)  Final   Report Status 12/17/2019 FINAL  Final  MRSA PCR Screening     Status: None   Collection Time: 12/16/19  2:39 AM   Specimen: Nasal Mucosa; Nasopharyngeal  Result Value Ref Range Status   MRSA by PCR NEGATIVE NEGATIVE Final    Comment:        The GeneXpert MRSA Assay (FDA approved for NASAL specimens only), is one component of a comprehensive MRSA colonization surveillance program. It is not intended to diagnose MRSA infection nor to guide or monitor treatment for MRSA infections. Performed at [facility], [address] [place], [place] [address]       Radiology Studies: DG Chest 1 View  Result Date: 12/15/2019 CLINICAL DATA:  Patient with wound infection of the foot. EXAM: CHEST  1 VIEW COMPARISON:  Chest radiograph 01/19/2008. FINDINGS: Monitoring leads overlie the patient. Stable cardiac and mediastinal contours. Pulmonary vascular redistribution. No pleural effusion or pneumothorax. IMPRESSION: Mild cardiomegaly with pulmonary vascular redistribution. Electronically Signed   By:  Annia Beltrew  Davis M.D.   On: 12/15/2019 18:23   DG Foot Complete Right  Result Date: 12/15/2019 CLINICAL DATA:  Wound infection plantar aspect right first metatarsophalangeal joint, fever, nausea EXAM: RIGHT FOOT COMPLETE - 3+ VIEW COMPARISON:  12/01/2019, 11/29/2019 FINDINGS: Frontal, oblique, lateral views of the right foot are obtained. There is progressive soft tissue swelling plantar aspect first digit at the level of the metatarsophalangeal joint. Soft tissue ulceration is again noted. There is no underlying bony destruction or periosteal reaction to suggest osteomyelitis. No fracture, subluxation, or dislocation. Joint spaces are well preserved. IMPRESSION: 1. Increasing soft tissue swelling at the site of right first digit soft tissue ulceration. 2. No evidence of osteomyelitis. Electronically Signed   By: Sharlet SalinaMichael  Brown M.D.   On: 12/15/2019 18:22   ECHOCARDIOGRAM COMPLETE  Result Date: 12/16/2019    ECHOCARDIOGRAM REPORT   Patient Name:   Chad Maddox Date of Exam: 12/16/2019 Medical Rec #:  [id number]      Height:       70.0 in Accession #:    [id number]     Weight:       225.0 lb Date of Birth:  Aug 11, 1955      BSA:          2.194 m Patient  Age:    64 years       BP:           113/78 mmHg Patient Gender: M              HR:           79 bpm. Exam Location:  Inpatient Procedure: 2D Echo Indications:    Atrial Fibrillation 427.31 / I48.91  History:        Patient has no prior history of Echocardiogram examinations.                 CAD; Risk Factors:Hypertension, Diabetes and Dyslipidemia.  Sonographer:    Leeroy Bock Turrentine Referring Phys: [id number] ARSHAD N KAKRAKANDY IMPRESSIONS  1. Left ventricular ejection fraction, by estimation, is 55 to 60%. The left ventricle has normal function. The left ventricle has no regional wall motion abnormalities. There is mild left ventricular hypertrophy. Left ventricular diastolic parameters were normal.  2. Right ventricular systolic function is normal. The right  ventricular size is normal. There is normal pulmonary artery systolic pressure. The estimated right ventricular systolic pressure is 26.4 mmHg.  3. Left atrial size was mildly dilated.  4. The mitral valve is abnormal. Trivial mitral valve regurgitation.  5. The aortic valve is tricuspid. Aortic valve regurgitation is not visualized.  6. The inferior vena cava is normal in size with greater than 50% respiratory variability, suggesting right atrial pressure of 3 mmHg. FINDINGS  Left Ventricle: Left ventricular ejection fraction, by estimation, is 55 to 60%. The left ventricle has normal function. The left ventricle has no regional wall motion abnormalities. The left ventricular internal cavity size was normal in size. There is  mild left ventricular hypertrophy. Left ventricular diastolic parameters were normal. Right Ventricle: The right ventricular size is normal. No increase in right ventricular wall thickness. Right ventricular systolic function is normal. There is normal pulmonary artery systolic pressure. The tricuspid regurgitant velocity is 2.42 m/s, and  with an assumed right atrial pressure of 3 mmHg, the estimated right ventricular systolic pressure is 26.4 mmHg. Left Atrium: Left atrial size was mildly dilated. Right Atrium: Right atrial size was normal in size. Pericardium: There is no evidence of pericardial effusion. Mitral Valve: The mitral valve is abnormal. There is mild thickening of the mitral valve leaflet(s). Trivial mitral valve regurgitation. Tricuspid Valve: The tricuspid valve is grossly normal. Tricuspid valve regurgitation is trivial. Aortic Valve: The aortic valve is tricuspid. Aortic valve regurgitation is not visualized. Pulmonic Valve: The pulmonic valve was grossly normal. Pulmonic valve regurgitation is trivial. Aorta: The aortic root and ascending aorta are structurally normal, with no evidence of dilitation. Venous: The inferior vena cava is normal in size with greater than 50%  respiratory variability, suggesting right atrial pressure of 3 mmHg. IAS/Shunts: No atrial level shunt detected by color flow Doppler.  LEFT VENTRICLE PLAX 2D LVIDd:         5.20 cm  Diastology LVIDs:         3.80 cm  LV e' lateral:   7.18 cm/s LV PW:         1.20 cm  LV E/e' lateral: 16.0 LV IVS:        1.20 cm  LV e' medial:    7.18 cm/s LVOT diam:     2.20 cm  LV E/e' medial:  16.0 LV SV:         103 LV SV Index:   47 LVOT Area:     3.80 cm  RIGHT VENTRICLE RV S prime:     11.40 cm/s TAPSE (M-mode): 1.5 cm LEFT ATRIUM             Index       RIGHT ATRIUM           Index LA diam:        4.70 cm 2.14 cm/m  RA Area:     20.20 cm LA Vol (A2C):   69.7 ml 31.76 ml/m RA Volume:   58.60 ml  26.70 ml/m LA Vol (A4C):   74.8 ml 34.09 ml/m LA Biplane Vol: 74.5 ml 33.95 ml/m  AORTIC VALVE LVOT Vmax:   141.00 cm/s LVOT Vmean:  91.400 cm/s LVOT VTI:    0.271 m  AORTA Ao Root diam: 3.20 cm MITRAL VALVE                TRICUSPID VALVE MV Area (PHT): 3.68 cm     TR Peak grad:   23.4 mmHg MV Decel Time: 206 msec     TR Vmax:        242.00 cm/s MV E velocity: 115.00 cm/s MV A velocity: 66.00 cm/s   SHUNTS MV E/A ratio:  1.74         Systemic VTI:  0.27 m                             Systemic Diam: 2.20 cm Zoila Shutter MD Electronically signed by Zoila Shutter MD Signature Date/Time: 12/16/2019/10:33:25 AM    Final      Scheduled Meds: . aspirin  81 mg Oral Daily  . enoxaparin (LOVENOX) injection  40 mg Subcutaneous Q24H  . insulin aspart  0-9 Units Subcutaneous TID WC  . insulin glargine  10 Units Subcutaneous QHS  . lisinopril  20 mg Oral Daily  . metoprolol tartrate  25 mg Oral BID  . rosuvastatin  40 mg Oral Daily   Continuous Infusions: .  ceFAZolin (ANCEF) IV       LOS: 2 days   Time Spent in minutes   45 minutes  Amiera Herzberg D.O. on 12/17/2019 at 10:21 AM  Between 7am to 7pm - Please see pager noted on [email or web address]  After 7pm go to [email or web address]  And look for the night coverage person covering  for me after hours  [facility] Office  [phone]

## 2019-12-17 NOTE — Progress Notes (Signed)
Orthopedic Tech Progress Note Patient Details:  Chad Maddox 10/10/1955 [id number] Also gave RN instruction on how patient uses shoe Ortho Devices Type of Ortho Device: Darco shoe Ortho Device/Splint Location: RLE Ortho Device/Splint Interventions: Ordered, Application   Post Interventions Patient Tolerated: Well, Ambulated well Instructions Provided: Poper ambulation with device, Care of device, Adjustment of device   Donald Pore 12/17/2019, 8:22 AM

## 2019-12-17 NOTE — Progress Notes (Signed)
Spoke with patient about his A1C of 11%.  States that he needs to get it down to 8% before he can have surgery on his shoulder. States that he watches what he eats, no alcohol. He states that he has trouble sleeping at night. Did start on Lantus 40 units at night recently, but states that he does forget to take sometimes. He checks CBGs about 4-5 times per week.   Encouraged him to take his medications every day to help get his blood sugars under control and his A1C down.   Smith Mince RN BSN CDE Diabetes Coordinator Pager: [phone]  8am-5pm

## 2019-12-17 NOTE — Consult Note (Signed)
ORTHOPAEDIC CONSULTATION  REQUESTING PHYSICIAN: Edsel Petrin, DO  Chief Complaint: Chronic ulcer plantar aspect of the abdomen the first metatarsal head right lower foot.  HPI: Chad Maddox is a 64 y.o. male who presents with uncontrolled type 2 diabetes with a chronic ulcer beneath the first metatarsal head right foot.  Patient has been seeing podiatry for a long period of time with wound debridement.  Patient is status post an MRI scan that shows no abscess and no underlying osteomyelitis.  Patient presented for his orthopedic appointment on Wednesday patient states that he had not had anything to eat or drink that day he had to wait 3 hours in the waiting room and then became tachycardic with fever chills nausea and vomiting.  Past Medical History:  Diagnosis Date  . Coronary artery disease    s/p NSTEMI 01/2008  -- s/p BMS to RCA 01/2008  --  s/p XIENCE DES TO OM2 12/2008  --  OTW NONOBS DZS AND PATENT RCA STENT, EF 60%   . Diabetes mellitus   . GERD (gastroesophageal reflux disease)   . Hyperlipidemia, mixed   . Hypertension   . Obesity    Past Surgical History:  Procedure Laterality Date  . CARDIAC CATHETERIZATION  12/22/2008    Triple-vessel coronary artery disease -- Patent stent in the proximal right coronary artery -- Severe stenosis of the second obtuse marginal branch -- Successful percutaneous coronary intervention with placement of a Xience drug-eluting stent in the second obtuse marginal branch.  -- Normal left ventricular systolic function   Social History   Socioeconomic History  . Marital status: Married    Spouse name: Not on file  . Number of children: Not on file  . Years of education: Not on file  . Highest education level: Not on file  Occupational History  . Not on file  Tobacco Use  . Smoking status: Never Smoker  . Smokeless tobacco: Never Used  Substance and Sexual Activity  . Alcohol use: No  . Drug use: No  . Sexual activity: Not on file    Other Topics Concern  . Not on file  Social History Narrative  . Not on file   Social Determinants of Health   Financial Resource Strain:   . Difficulty of Paying Living Expenses:   Food Insecurity:   . Worried About Programme researcher, broadcasting/film/video in the Last Year:   . Barista in the Last Year:   Transportation Needs:   . Freight forwarder (Medical):   [name] Lack of Transportation (Non-Medical):   Physical Activity:   . Days of Exercise per Week:   . Minutes of Exercise per Session:   Stress:   . Feeling of Stress :   Social Connections:   . Frequency of Communication with Friends and Family:   . Frequency of Social Gatherings with Friends and Family:   . Attends Religious Services:   . Active Member of Clubs or Organizations:   . Attends Banker Meetings:   [name] Marital Status:    Family History  Problem Relation Age of Onset  . Cerebral aneurysm Mother   . Diabetes Mother   . COPD Father   . Bone cancer Father   . Lung cancer Father   . Lymphoma Sister    - negative except otherwise stated in the family history section Allergies  Allergen Reactions  . Shellfish Allergy Shortness Of Breath and Swelling  . Penicillins Other (See Comments)  Occurred as an infant; reaction unknown.   Prior to Admission medications   Medication Sig Start Date End Date Taking? Authorizing Provider  aspirin 81 MG tablet Take 81 mg by mouth daily.     Yes [provider]  canagliflozin (INVOKANA) 100 MG TABS tablet Take 100 mg by mouth daily.   Yes [provider]  glipiZIDE (GLUCOTROL XL) 10 MG 24 hr tablet Take 10 mg by mouth daily.     Yes [provider]  lisinopril (PRINIVIL,ZESTRIL) 20 MG tablet Take 1 tablet (20 mg total) by mouth daily. 04/19/16  Yes [place], Peter M, MD  metFORMIN (GLUCOPHAGE-XR) 500 MG 24 hr tablet Take 1,000 mg by mouth 2 (two) times daily.     Yes [provider]  metoprolol tartrate (LOPRESSOR) 25 MG tablet Take 1  tablet (25 mg total) by mouth 2 (two) times daily. 04/19/16  Yes [place], Peter M, MD  nitroGLYCERIN (NITROSTAT) 0.4 MG SL tablet Place 1 tablet (0.4 mg total) under the tongue every 5 (five) minutes as needed. Patient taking differently: Place 0.4 mg under the tongue every 5 (five) minutes as needed for chest pain.  04/19/16  Yes [place], Peter M, MD  rosuvastatin (CRESTOR) 40 MG tablet TAKE 1 TABLET BY MOUTH  DAILY 02/16/19  Yes [place], Peter M, MD  traMADol (ULTRAM) 50 MG tablet Take 1 tablet (50 mg total) by mouth every 8 (eight) hours as needed. Patient taking differently: Take 50 mg by mouth every 8 (eight) hours as needed for moderate pain.  05/05/19  Yes Magnant, Arlyss Gandy   DG Chest 1 View  Result Date: 12/15/2019 CLINICAL DATA:  Patient with wound infection of the foot. EXAM: CHEST  1 VIEW COMPARISON:  Chest radiograph 01/19/2008. FINDINGS: Monitoring leads overlie the patient. Stable cardiac and mediastinal contours. Pulmonary vascular redistribution. No pleural effusion or pneumothorax. IMPRESSION: Mild cardiomegaly with pulmonary vascular redistribution. Electronically Signed   By: Annia Belt M.D.   On: 12/15/2019 18:23   DG Foot Complete Right  Result Date: 12/15/2019 CLINICAL DATA:  Wound infection plantar aspect right first metatarsophalangeal joint, fever, nausea EXAM: RIGHT FOOT COMPLETE - 3+ VIEW COMPARISON:  12/01/2019, 11/29/2019 FINDINGS: Frontal, oblique, lateral views of the right foot are obtained. There is progressive soft tissue swelling plantar aspect first digit at the level of the metatarsophalangeal joint. Soft tissue ulceration is again noted. There is no underlying bony destruction or periosteal reaction to suggest osteomyelitis. No fracture, subluxation, or dislocation. Joint spaces are well preserved. IMPRESSION: 1. Increasing soft tissue swelling at the site of right first digit soft tissue ulceration. 2. No evidence of osteomyelitis. Electronically Signed   By:  Sharlet Salina M.D.   On: 12/15/2019 18:22   ECHOCARDIOGRAM COMPLETE  Result Date: 12/16/2019    ECHOCARDIOGRAM REPORT   Patient Name:   Chad Maddox Date of Exam: 12/16/2019 Medical Rec #:  [id number]      Height:       70.0 in Accession #:    [id number]     Weight:       225.0 lb Date of Birth:  1956/04/09      BSA:          2.194 m Patient Age:    64 years       BP:           113/78 mmHg Patient Gender: M              HR:  79 bpm. Exam Location:  Inpatient Procedure: 2D Echo Indications:    Atrial Fibrillation 427.31 / I48.91  History:        Patient has no prior history of Echocardiogram examinations.                 CAD; Risk Factors:Hypertension, Diabetes and Dyslipidemia.  Sonographer:    Leeroy Bock Turrentine Referring Phys: [id number] ARSHAD N KAKRAKANDY IMPRESSIONS  1. Left ventricular ejection fraction, by estimation, is 55 to 60%. The left ventricle has normal function. The left ventricle has no regional wall motion abnormalities. There is mild left ventricular hypertrophy. Left ventricular diastolic parameters were normal.  2. Right ventricular systolic function is normal. The right ventricular size is normal. There is normal pulmonary artery systolic pressure. The estimated right ventricular systolic pressure is 26.4 mmHg.  3. Left atrial size was mildly dilated.  4. The mitral valve is abnormal. Trivial mitral valve regurgitation.  5. The aortic valve is tricuspid. Aortic valve regurgitation is not visualized.  6. The inferior vena cava is normal in size with greater than 50% respiratory variability, suggesting right atrial pressure of 3 mmHg. FINDINGS  Left Ventricle: Left ventricular ejection fraction, by estimation, is 55 to 60%. The left ventricle has normal function. The left ventricle has no regional wall motion abnormalities. The left ventricular internal cavity size was normal in size. There is  mild left ventricular hypertrophy. Left ventricular diastolic parameters were normal. Right  Ventricle: The right ventricular size is normal. No increase in right ventricular wall thickness. Right ventricular systolic function is normal. There is normal pulmonary artery systolic pressure. The tricuspid regurgitant velocity is 2.42 m/s, and  with an assumed right atrial pressure of 3 mmHg, the estimated right ventricular systolic pressure is 26.4 mmHg. Left Atrium: Left atrial size was mildly dilated. Right Atrium: Right atrial size was normal in size. Pericardium: There is no evidence of pericardial effusion. Mitral Valve: The mitral valve is abnormal. There is mild thickening of the mitral valve leaflet(s). Trivial mitral valve regurgitation. Tricuspid Valve: The tricuspid valve is grossly normal. Tricuspid valve regurgitation is trivial. Aortic Valve: The aortic valve is tricuspid. Aortic valve regurgitation is not visualized. Pulmonic Valve: The pulmonic valve was grossly normal. Pulmonic valve regurgitation is trivial. Aorta: The aortic root and ascending aorta are structurally normal, with no evidence of dilitation. Venous: The inferior vena cava is normal in size with greater than 50% respiratory variability, suggesting right atrial pressure of 3 mmHg. IAS/Shunts: No atrial level shunt detected by color flow Doppler.  LEFT VENTRICLE PLAX 2D LVIDd:         5.20 cm  Diastology LVIDs:         3.80 cm  LV e' lateral:   7.18 cm/s LV PW:         1.20 cm  LV E/e' lateral: 16.0 LV IVS:        1.20 cm  LV e' medial:    7.18 cm/s LVOT diam:     2.20 cm  LV E/e' medial:  16.0 LV SV:         103 LV SV Index:   47 LVOT Area:     3.80 cm  RIGHT VENTRICLE RV S prime:     11.40 cm/s TAPSE (M-mode): 1.5 cm LEFT ATRIUM             Index       RIGHT ATRIUM           Index LA diam:  4.70 cm 2.14 cm/m  RA Area:     20.20 cm LA Vol (A2C):   69.7 ml 31.76 ml/m RA Volume:   58.60 ml  26.70 ml/m LA Vol (A4C):   74.8 ml 34.09 ml/m LA Biplane Vol: 74.5 ml 33.95 ml/m  AORTIC VALVE LVOT Vmax:   141.00 cm/s LVOT  Vmean:  91.400 cm/s LVOT VTI:    0.271 m  AORTA Ao Root diam: 3.20 cm MITRAL VALVE                TRICUSPID VALVE MV Area (PHT): 3.68 cm     TR Peak grad:   23.4 mmHg MV Decel Time: 206 msec     TR Vmax:        242.00 cm/s MV E velocity: 115.00 cm/s MV A velocity: 66.00 cm/s   SHUNTS MV E/A ratio:  1.74         Systemic VTI:  0.27 m                             Systemic Diam: 2.20 cm Zoila ShutterKenneth Hilty MD Electronically signed by Zoila ShutterKenneth Hilty MD Signature Date/Time: 12/16/2019/10:33:25 AM    Final    - pertinent xrays, CT, MRI studies were reviewed and independently interpreted  Positive ROS: All other systems have been reviewed and were otherwise negative with the exception of those mentioned in the HPI and as above.  Physical Exam: General: Alert, no acute distress Psychiatric: Patient is competent for consent with normal mood and affect Lymphatic: No axillary or cervical lymphadenopathy Cardiovascular: No pedal edema Respiratory: No cyanosis, no use of accessory musculature GI: No organomegaly, abdomen is soft and non-tender    Images:  @ENCIMAGES @  Labs:  Lab Results  Component Value Date   HGBA1C 11.0 (H) 11/29/2019   HGBA1C 11.7 (H) 08/18/2019   HGBA1C 9.0 (H) 03/29/2009   REPTSTATUS PENDING 12/15/2019   REPTSTATUS PENDING 12/15/2019   CULT GRAM POSITIVE COCCI 12/15/2019   CULT  12/15/2019    NO GROWTH 2 DAYS Performed at [facility], [address] [place], [place] [id number]     Lab Results  Component Value Date   ALBUMIN 3.9 12/15/2019   ALBUMIN 4.2 03/29/2009   ALBUMIN 3.9 12/22/2008    Neurologic: Patient does not have protective sensation bilateral lower extremities.   MUSCULOSKELETAL:   Skin: Examination patient does have some swelling on the plantar aspect of his foot he has venous swelling in both lower extremities with pitting edema no brawny skin color changes there is a chronic ulcer beneath the first metatarsal head right foot.  The ulcer is 5 mm  in diameter and 2 mm deep there is no purulent drainage there is no exposed bone or tendon.  Patient has a palpable dorsalis pedis and posterior tibial pulse.  Review of the MRI scan shows no underlying abscess no osteomyelitis.  Patient's most recent hemoglobin A1c is 11.  White blood cell count has decreased from 16.2-9.3.  Assessment: Assessment: Uncontrolled type II diabetic with chronic ulceration right foot without osteomyelitis or abscess with resolving sepsis on IV antibiotics.  Plan: Plan: They would continue with dry dressing changes to the right foot nonweightbearing on the forefoot I will write an order for a Darco shoe.  I will follow-up in the office in 1 week.  No surgical intervention indicated at this time for the right foot ulcer.  Thank you for the consult and the  opportunity to see [name], [facility] [phone] 7:20 AM

## 2019-12-17 NOTE — Plan of Care (Signed)
  Problem: Education: Goal: Knowledge of General Education information will improve Description: Including pain rating scale, medication(s)/side effects and non-pharmacologic comfort measures Outcome: Progressing   Problem: Health Behavior/Discharge Planning: Goal: Ability to manage health-related needs will improve Outcome: Progressing   Problem: Clinical Measurements: Goal: Ability to maintain clinical measurements within normal limits will improve Outcome: Progressing   Problem: Clinical Measurements: Goal: Will remain free from infection Outcome: Progressing   Problem: Clinical Measurements: Goal: Diagnostic test results will improve Outcome: Progressing   Problem: Clinical Measurements: Goal: Cardiovascular complication will be avoided Outcome: Progressing   Problem: Activity: Goal: Risk for activity intolerance will decrease Outcome: Progressing   Problem: Nutrition: Goal: Adequate nutrition will be maintained Outcome: Progressing   Problem: Coping: Goal: Level of anxiety will decrease Outcome: Progressing   Problem: Pain Managment: Goal: General experience of comfort will improve Outcome: Progressing   Problem: Safety: Goal: Ability to remain free from injury will improve Outcome: Progressing   Problem: Skin Integrity: Goal: Risk for impaired skin integrity will decrease Outcome: Progressing   

## 2019-12-17 NOTE — Progress Notes (Signed)
ABI       has been completed. Preliminary results can be found under CV proc through chart review. Hussain Maimone, BS, RDMS, RVT   

## 2019-12-18 LAB — CULTURE, BLOOD (ROUTINE X 2): Special Requests: ADEQUATE

## 2019-12-18 LAB — BASIC METABOLIC PANEL
Anion gap: 7 (ref 5–15)
BUN: 17 mg/dL (ref 8–23)
CO2: 21 mmol/L — ABNORMAL LOW (ref 22–32)
Calcium: 8.3 mg/dL — ABNORMAL LOW (ref 8.9–10.3)
Chloride: 106 mmol/L (ref 98–111)
Creatinine, Ser: 0.8 mg/dL (ref 0.61–1.24)
GFR calc Af Amer: >60 mL/min (ref >60)
GFR calc non Af Amer: >60 mL/min (ref >60)
Glucose, Bld: 290 mg/dL — ABNORMAL HIGH (ref 70–99)
Potassium: 4.2 mmol/L (ref 3.5–5.1)
Sodium: 134 mmol/L — ABNORMAL LOW (ref 135–145)

## 2019-12-18 LAB — CBC
HCT: 43.4 % (ref 39.0–52.0)
Hemoglobin: 14 g/dL (ref 13.0–17.0)
MCH: 25 pg — ABNORMAL LOW (ref 26.0–34.0)
MCHC: 32.3 g/dL (ref 30.0–36.0)
MCV: 77.6 fL — ABNORMAL LOW (ref 80.0–100.0)
Platelets: 167 10*3/uL (ref 150–400)
RBC: 5.59 MIL/uL (ref 4.22–5.81)
RDW: 15.9 % — ABNORMAL HIGH (ref 11.5–15.5)
WBC: 9.1 10*3/uL (ref 4.0–10.5)
nRBC: 0 % (ref 0.0–0.2)

## 2019-12-18 LAB — GLUCOSE, CAPILLARY
Glucose-Capillary: 272 mg/dL — ABNORMAL HIGH (ref 70–99)
Glucose-Capillary: 251 mg/dL — ABNORMAL HIGH (ref 70–99)
Glucose-Capillary: 211 mg/dL — ABNORMAL HIGH (ref 70–99)

## 2019-12-18 MED ORDER — CEPHALEXIN 500 MG PO CAPS: 500.0000 mg | ORAL_CAPSULE | Freq: Four times a day (QID) | ORAL | 0 refills | Status: AC

## 2019-12-18 MED ORDER — OXYCODONE-ACETAMINOPHEN 5-325 MG PO TABS: 1.0000 | ORAL_TABLET | Freq: Four times a day (QID) | ORAL | 0 refills | Status: DC | PRN

## 2019-12-18 NOTE — Discharge Summary (Signed)
Physician Discharge Summary  Daisean Maddox ZOX:[id number] DOB: 04-08-1956 DOA: 12/15/2019  PCP: System, Pcp Not In  Admit date: 12/15/2019 Discharge date: 12/18/2019  Time spent: 45 minutes  Recommendations for Outpatient Follow-up:  Patient will be discharged to home.  Patient will need to follow up with primary care provider within one week of discharge.  Follow-up with Dr. Lajoyce Corners in 1 week.  Patient should continue medications as prescribed.  Patient should follow a heart healthy/carb modified diet.    Discharge Diagnoses:  Sepsis possibly secondary to right foot ulceration and cellulitis/bactermia Sinus rhythm with competing ectopic atrial rhythm  Diabetes mellitus, type II-uncontrolled with hyperglycemia  Essential hypertension History of CAD Umbilical hernia   Discharge Condition: Stable  Diet recommendation: Heart healthy/carb modified  Filed Weights   12/15/19 1726  Weight: 102.1 kg    History of present illness:  on 12/15/2019 by Dr. 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 Patient has been a ulceration since September 2020. Last week patient had an MRI done which shows features consistent with cellulitis with no bony involvement. Today patient was following up with orthopedics and was waiting in the office when patient became febrile tachycardic and had nausea with 1 episode of vomiting. Denies any abdominal pain or diarrhea. Given the symptoms patient was brought to the ER. Patient denies any chest pain or shortness of breath or productive cough.  Hospital Course:  Sepsis possibly secondary to right foot ulceration and cellulitis/bactermia -Present on admission, noted to have fever of 101.7 F, tachycardia, tachypnea with  leukocytosis -Cellulitis order set as well as sepsis order set utilized -Patient states he has been seeing a foot doctor since September 2020 for which she sees him every week.  He states it has not gotten any better.  He noted that he had a fever prior to admission and started feeling poorly. -Chest x-ray and UA unremarkable for infection -COVID-19 negative -Right foot x-ray showed increased soft tissue swelling at the right first digit soft tissue ulceration.  No evidence of osteomyelitis -MRI of the right foot on 12/01/2019 (see prior to admission) showed a superficial soft tissue wound/ulceration with on cellulitis underlying the plantar aspect of the first metatarsal head.  No evidence of osteomyelitis. -Continue IV fluids -Blood cultures strep agalactiae in anaerobic bottle only  -was on IV antibiotics, vancomycin, Flagyl, cefepime- however given blood culture results,  transitioned to cefazolin and obtained repeat blood cultures -Repeat blood cultures 05/19/2020 show no growth to date -Orthopedic surgery consulted and appreciated- recommended Darco shoe, no weight bearing on the forefoot. Dry dressing changes. Follow up with Dr. Lajoyce Corners in one week -Discharge patient with a limited amount of hydrocodone as well as Keflex -Of note though patient has a penicillin allergy has been on cephalosporins during his hospitalization and has tolerated well. -Seen by PT, no further needs  Sinus rhythm with competing ectopic atrial rhythm  -Heart rate appears to be rate controlled at this time -Appears to be in sinus rhythm -CHA2DS2-VASc 2 (HTN, DM) -EKG shows SR with 1AVB, PVC -Echocardiogram obtained showing an EF of 55 to 60%, mild LVH.  LV diastolic parameters normal. -it seems that on admission, it seems that patient was thought to have new onset atrial fibrillation. -Cardiology consulted and appreciated, and feels that this is atrial fibrillation but more of  a sinus arrhythmia versus competing  atrial pacemaker.  No evidence of atrial fibrillation, heparin was discontinued.  No further cardiac work-up indicated.  Diabetes mellitus, type II-uncontrolled with hyperglycemia  -was placed on Lantus, insulin sliding scale and CBG monitoring -Home medications of Invokana, glipizide, Metformin held -Last hemoglobin A1c on 11/29/2019 was 11 -Discussed the importance of medication compliance.  Patient did admit to not taking his medications on a regular basis and that he forgets at times.  Essential hypertension -Continue lisinopril, metoprolol  History of CAD -Currently denies any chest pain -Continue aspirin, statin, lisinopril, metoprolol  Umbilical hernia  -Continue to monitor  Consultants Orthopedic surgery Cardiology  Procedures  Echocardiogram  Discharge Exam: Vitals:   12/18/19 0902 12/18/19 1127  BP: 113/63 (!) 111/94  Pulse: 68 69  Resp:  20  Temp:  97.9 F (36.6 C)  SpO2:  95%     General: Well developed, well nourished, NAD, appears stated age  HEENT: NCAT, mucous membranes moist.   Cardiovascular: S1 S2 auscultated, RRR, no murmur appreciated  Respiratory: Clear to auscultation bilaterally, no wheezing  Abdomen: Soft, nontender, nondistended, + bowel sounds  Extremities: warm dry without cyanosis clubbing.  Bilateral pedal edema.  Dressing on right foot  Neuro: AAOx3, nonfocal  Psych: Pleasant, appropriate mood and affect  Discharge Instructions Discharge Instructions    Discharge instructions   Complete by: As directed    Patient will be discharged to home.  Patient will need to follow up with primary care provider within one week of discharge.  Follow-up with Dr. Lajoyce Corners in 1 week.  Patient should continue medications as prescribed.  Patient should follow a heart healthy/carb modified diet.     Allergies as of 12/18/2019      Reactions   Shellfish Allergy Shortness Of Breath, Swelling   Penicillins Other (See Comments)   Occurred as an  infant; reaction unknown.      Medication List    TAKE these medications   aspirin 81 MG tablet Take 81 mg by mouth daily.   cephALEXin 500 MG capsule Commonly known as: KEFLEX Take 1 capsule (500 mg total) by mouth 4 (four) times daily for 7 days. Continue through 6/4   glipiZIDE 10 MG 24 hr tablet Commonly known as: GLUCOTROL XL Take 10 mg by mouth daily.   Invokana 100 MG Tabs tablet Generic drug: canagliflozin Take 100 mg by mouth daily.   lisinopril 20 MG tablet Commonly known as: ZESTRIL Take 1 tablet (20 mg total) by mouth daily.   metFORMIN 500 MG 24 hr tablet Commonly known as: GLUCOPHAGE-XR Take 1,000 mg by mouth 2 (two) times daily.   metoprolol tartrate 25 MG tablet Commonly known as: LOPRESSOR Take 1 tablet (25 mg total) by mouth 2 (two) times daily.   nitroGLYCERIN 0.4 MG SL tablet Commonly known as: NITROSTAT Place 1 tablet (0.4 mg total) under the tongue every 5 (five) minutes as needed. What changed: reasons to take this   oxyCODONE-acetaminophen 5-325 MG tablet Commonly known as: PERCOCET/ROXICET Take 1-2 tablets by mouth every 6 (six) hours as needed for moderate pain or severe pain.   rosuvastatin 40 MG tablet Commonly known as: CRESTOR TAKE 1 TABLET BY MOUTH  DAILY   traMADol 50 MG tablet Commonly known as: ULTRAM Take 1 tablet (50 mg total) by mouth every 8 (eight) hours as needed. What changed: reasons to take this      Allergies  Allergen Reactions  . Shellfish Allergy Shortness Of Breath and Swelling  .  Penicillins Other (See Comments)    Occurred as an infant; reaction unknown.   Follow-up Information    Newt Minion, MD Follow up in 1 week(s).   Specialty: Orthopedic Surgery Contact information: [place] [address] [phone]        Martinique, Peter M, MD. Schedule an appointment as soon as possible for a visit in 1 week(s).   Specialty: Cardiology Why: Hospital follow up Contact information: [place] [address] [phone]            The results of significant diagnostics from this hospitalization (including imaging, microbiology, ancillary and laboratory) are listed below for reference.    Significant Diagnostic Studies: DG Chest 1 View  Result Date: 12/15/2019 CLINICAL DATA:  Patient with wound infection of the foot. EXAM: CHEST  1 VIEW COMPARISON:  Chest radiograph 01/19/2008. FINDINGS: Monitoring leads overlie the patient. Stable cardiac and mediastinal contours. Pulmonary vascular redistribution. No pleural effusion or pneumothorax. IMPRESSION: Mild cardiomegaly with pulmonary vascular redistribution. Electronically Signed   By: Lovey Newcomer M.D.   On: 12/15/2019 18:23   MR Foot Right w/o contrast  Result Date: 12/02/2019 CLINICAL DATA:  Pain and swelling at the plantar aspect of the right great toe. History of stepping on a screw. EXAM: MRI OF THE RIGHT FOREFOOT WITHOUT CONTRAST TECHNIQUE: Multiplanar, multisequence MR imaging of the right forefoot was performed. No intravenous contrast was administered. COMPARISON:  X-ray 11/29/2019 FINDINGS: Bones/Joint/Cartilage No acute fracture. No malalignment. No bone marrow edema. No cortical destruction or marrow replacement. Mild arthropathy at the fifth MTP joint. No joint effusions. Ligaments Intact Lisfranc ligament. Collateral ligaments of the foot are intact. Intact plantar plates. Muscles and Tendons Edematous appearance of the intrinsic foot musculature. Flexor and extensor tendons are intact. No tenosynovial fluid collection. Soft tissues Focal soft tissue irregularity with associated skin thickening and edema at the plantar surface of the medial foot at the level of the first metatarsal head (series 4, image 23). No sinus tract extending to the underlying bone. No defined fluid collection. No intermetatarsal bursal fluid collection. IMPRESSION: Superficial soft tissue wound/ulceration with cellulitis  underlying the plantar aspect of the first metatarsal head. No evidence of osteomyelitis. Electronically Signed   By: Davina Poke D.O.   On: 12/02/2019 15:31   DG Foot Complete Right  Result Date: 12/15/2019 CLINICAL DATA:  Wound infection plantar aspect right first metatarsophalangeal joint, fever, nausea EXAM: RIGHT FOOT COMPLETE - 3+ VIEW COMPARISON:  12/01/2019, 11/29/2019 FINDINGS: Frontal, oblique, lateral views of the right foot are obtained. There is progressive soft tissue swelling plantar aspect first digit at the level of the metatarsophalangeal joint. Soft tissue ulceration is again noted. There is no underlying bony destruction or periosteal reaction to suggest osteomyelitis. No fracture, subluxation, or dislocation. Joint spaces are well preserved. IMPRESSION: 1. Increasing soft tissue swelling at the site of right first digit soft tissue ulceration. 2. No evidence of osteomyelitis. Electronically Signed   By: Randa Ngo M.D.   On: 12/15/2019 18:22   VAS [place] ABI WITH/WO TBI  Result Date: 12/17/2019 LOWER EXTREMITY DOPPLER STUDY Indications: Right foot ulcer. High Risk Factors: Diabetes.  Performing Technologist: June Leap Rvt, Rdms  Examination Guidelines: A complete evaluation includes at minimum, Doppler waveform signals and systolic blood pressure reading at the level of bilateral brachial, anterior tibial, and posterior tibial arteries, when vessel segments are accessible. Bilateral testing is considered an integral part of a complete examination. Photoelectric Plethysmograph (PPG) waveforms  and toe systolic pressure readings are included as required and additional duplex testing as needed. Limited examinations for reoccurring indications may be performed as noted.  ABI Findings: +---------+------------------+-----+---------+--------+ Right    Rt Pressure (mmHg)IndexWaveform Comment  +---------+------------------+-----+---------+--------+ Brachial 137                     triphasic         +---------+------------------+-----+---------+--------+ ATA      141               1.03 triphasic         +---------+------------------+-----+---------+--------+ PTA      137               1.00 biphasic          +---------+------------------+-----+---------+--------+ Morton AmyGreat Toe113               0.82 Normal            +---------+------------------+-----+---------+--------+ +---------+------------------+-----+---------+-------+ Left     Lt Pressure (mmHg)IndexWaveform Comment +---------+------------------+-----+---------+-------+ Brachial 127                    triphasic        +---------+------------------+-----+---------+-------+ ATA      146               1.07 triphasic        +---------+------------------+-----+---------+-------+ PTA      138               1.01 triphasic        +---------+------------------+-----+---------+-------+ Great Toe126               0.92 Normal           +---------+------------------+-----+---------+-------+  Summary: Right: Resting right ankle-brachial index is within normal range. No evidence of significant right lower extremity arterial disease. The right toe-brachial index is normal. Left: Resting left ankle-brachial index is within normal range. No evidence of significant left lower extremity arterial disease. The left toe-brachial index is normal.  *See table(s) above for measurements and observations.    Preliminary    ECHOCARDIOGRAM COMPLETE  Result Date: 12/16/2019    ECHOCARDIOGRAM REPORT   Patient Name:   Chad Maddox Date of Exam: 12/16/2019 Medical Rec #:  [id number]      Height:       70.0 in Accession #:    [id number]     Weight:       225.0 lb Date of Birth:  07-Feb-1956      BSA:          2.194 m Patient Age:    64 years       BP:           113/78 mmHg Patient Gender: M              HR:           79 bpm. Exam Location:  Inpatient Procedure: 2D Echo Indications:    Atrial Fibrillation 427.31 / I48.91   History:        Patient has no prior history of Echocardiogram examinations.                 CAD; Risk Factors:Hypertension, Diabetes and Dyslipidemia.  Sonographer:    Leeroy Bockhelsea Turrentine Referring Phys: [id number] ARSHAD N KAKRAKANDY IMPRESSIONS  1. Left ventricular ejection fraction, by estimation, is 55 to 60%. The left ventricle has normal function. The left ventricle has no regional wall motion abnormalities. There is  mild left ventricular hypertrophy. Left ventricular diastolic parameters were normal.  2. Right ventricular systolic function is normal. The right ventricular size is normal. There is normal pulmonary artery systolic pressure. The estimated right ventricular systolic pressure is 26.4 mmHg.  3. Left atrial size was mildly dilated.  4. The mitral valve is abnormal. Trivial mitral valve regurgitation.  5. The aortic valve is tricuspid. Aortic valve regurgitation is not visualized.  6. The inferior vena cava is normal in size with greater than 50% respiratory variability, suggesting right atrial pressure of 3 mmHg. FINDINGS  Left Ventricle: Left ventricular ejection fraction, by estimation, is 55 to 60%. The left ventricle has normal function. The left ventricle has no regional wall motion abnormalities. The left ventricular internal cavity size was normal in size. There is  mild left ventricular hypertrophy. Left ventricular diastolic parameters were normal. Right Ventricle: The right ventricular size is normal. No increase in right ventricular wall thickness. Right ventricular systolic function is normal. There is normal pulmonary artery systolic pressure. The tricuspid regurgitant velocity is 2.42 m/s, and  with an assumed right atrial pressure of 3 mmHg, the estimated right ventricular systolic pressure is 26.4 mmHg. Left Atrium: Left atrial size was mildly dilated. Right Atrium: Right atrial size was normal in size. Pericardium: There is no evidence of pericardial effusion. Mitral Valve: The mitral  valve is abnormal. There is mild thickening of the mitral valve leaflet(s). Trivial mitral valve regurgitation. Tricuspid Valve: The tricuspid valve is grossly normal. Tricuspid valve regurgitation is trivial. Aortic Valve: The aortic valve is tricuspid. Aortic valve regurgitation is not visualized. Pulmonic Valve: The pulmonic valve was grossly normal. Pulmonic valve regurgitation is trivial. Aorta: The aortic root and ascending aorta are structurally normal, with no evidence of dilitation. Venous: The inferior vena cava is normal in size with greater than 50% respiratory variability, suggesting right atrial pressure of 3 mmHg. IAS/Shunts: No atrial level shunt detected by color flow Doppler.  LEFT VENTRICLE PLAX 2D LVIDd:         5.20 cm  Diastology LVIDs:         3.80 cm  LV e' lateral:   7.18 cm/s LV PW:         1.20 cm  LV E/e' lateral: 16.0 LV IVS:        1.20 cm  LV e' medial:    7.18 cm/s LVOT diam:     2.20 cm  LV E/e' medial:  16.0 LV SV:         103 LV SV Index:   47 LVOT Area:     3.80 cm  RIGHT VENTRICLE RV S prime:     11.40 cm/s TAPSE (M-mode): 1.5 cm LEFT ATRIUM             Index       RIGHT ATRIUM           Index LA diam:        4.70 cm 2.14 cm/m  RA Area:     20.20 cm LA Vol (A2C):   69.7 ml 31.76 ml/m RA Volume:   58.60 ml  26.70 ml/m LA Vol (A4C):   74.8 ml 34.09 ml/m LA Biplane Vol: 74.5 ml 33.95 ml/m  AORTIC VALVE LVOT Vmax:   141.00 cm/s LVOT Vmean:  91.400 cm/s LVOT VTI:    0.271 m  AORTA Ao Root diam: 3.20 cm MITRAL VALVE                TRICUSPID VALVE  MV Area (PHT): 3.68 cm     TR Peak grad:   23.4 mmHg MV Decel Time: 206 msec     TR Vmax:        242.00 cm/s MV E velocity: 115.00 cm/s MV A velocity: 66.00 cm/s   SHUNTS MV E/A ratio:  1.74         Systemic VTI:  0.27 m                             Systemic Diam: 2.20 cm Zoila Shutter MD Electronically signed by Zoila Shutter MD Signature Date/Time: 12/16/2019/10:33:25 AM    Final     Microbiology: Recent Results (from the past 240  hour(s))  Blood culture (routine x 2)     Status: Abnormal   Collection Time: 12/15/19  5:31 PM   Specimen: BLOOD  Result Value Ref Range Status   Specimen Description BLOOD BLOOD LEFT FOREARM  Final   Special Requests   Final    BOTTLES DRAWN AEROBIC AND ANAEROBIC Blood Culture adequate volume   Culture  Setup Time   Final    GRAM POSITIVE COCCI IN CLUSTERS ANAEROBIC BOTTLE ONLY CRITICAL RESULT CALLED TO, READ BACK BY AND VERIFIED WITH: Matilde Sprang [id number] FCP Performed at [facility], [address], [place], [place] [address]    Culture GROUP B STREP(S.AGALACTIAE)ISOLATED (A)  Final   Report Status 12/18/2019 FINAL  Final   Organism ID, Bacteria GROUP B STREP(S.AGALACTIAE)ISOLATED  Final      Susceptibility   Group b strep(s.agalactiae)isolated - MIC*    CLINDAMYCIN RESISTANT Resistant     AMPICILLIN <=0.25 SENSITIVE Sensitive     ERYTHROMYCIN >=8 RESISTANT Resistant     VANCOMYCIN 0.5 SENSITIVE Sensitive     CEFTRIAXONE <=0.12      LEVOFLOXACIN 0.5 SENSITIVE Sensitive     PENICILLIN Value in next row Sensitive      SENSITIVE0.06    * GROUP B STREP(S.AGALACTIAE)ISOLATED  Blood culture (routine x 2)     Status: None (Preliminary result)   Collection Time: 12/15/19  5:31 PM   Specimen: BLOOD LEFT HAND  Result Value Ref Range Status   Specimen Description BLOOD LEFT HAND  Final   Special Requests   Final    BOTTLES DRAWN AEROBIC AND ANAEROBIC Blood Culture adequate volume   Culture   Final    NO GROWTH 3 DAYS Performed at [facility], [address], [place], [place] [address]    Report Status PENDING  Incomplete  SARS Coronavirus 2 by RT PCR (hospital order, performed in [facility] hospital lab) Nasopharyngeal Nasopharyngeal Swab     Status: None   Collection Time: 12/15/19  5:31 PM   Specimen: Nasopharyngeal Swab  Result Value Ref Range Status   SARS Coronavirus 2 NEGATIVE NEGATIVE Final    Comment: (NOTE) SARS-CoV-2 target nucleic acids are NOT  DETECTED. The SARS-CoV-2 RNA is generally detectable in upper and lower respiratory specimens during the acute phase of infection. The lowest concentration of SARS-CoV-2 viral copies this assay can detect is 250 copies / mL. A negative result does not preclude SARS-CoV-2 infection and should not be used as the sole basis for treatment or other patient management decisions.  A negative result may occur with improper specimen collection / handling, submission of specimen other than nasopharyngeal swab, presence of viral mutation(s) within the areas targeted by this assay, and inadequate number of viral copies (<  250 copies / mL). A negative result must be combined with clinical observations, patient history, and epidemiological information. Fact Sheet for Patients:   BoilerBrush.com.cy Fact Sheet for Healthcare Providers: https://pope.com/ This test is not yet approved or cleared  by the [place] FDA and has been authorized for detection and/or diagnosis of SARS-CoV-2 by FDA under an Emergency Use Authorization (EUA).  This EUA will remain in effect (meaning this test can be used) for the duration of the COVID-19 declaration under Section 564(b)(1) of the Act, 21 U.S.C. section 360bbb-3(b)(1), unless the authorization is terminated or revoked sooner. Performed at [facility], [address] [place], [place] [address]   Blood Culture ID Panel (Reflexed)     Status: Abnormal   Collection Time: 12/15/19  5:31 PM  Result Value Ref Range Status   Enterococcus species NOT DETECTED NOT DETECTED Final   Listeria monocytogenes NOT DETECTED NOT DETECTED Final   Staphylococcus species NOT DETECTED NOT DETECTED Final   Staphylococcus aureus (BCID) NOT DETECTED NOT DETECTED Final   Streptococcus species DETECTED (A) NOT DETECTED Final    Comment: CRITICAL RESULT CALLED TO, READ BACK BY AND VERIFIED WITH: PHARMD M GIBBS [id number] FCP     Streptococcus agalactiae DETECTED (A) NOT DETECTED Final    Comment: CRITICAL RESULT CALLED TO, READ BACK BY AND VERIFIED WITH: PHARMD M GIBBS [id number] FCP    Streptococcus pneumoniae NOT DETECTED NOT DETECTED Final   Streptococcus pyogenes NOT DETECTED NOT DETECTED Final   Acinetobacter baumannii NOT DETECTED NOT DETECTED Final   Enterobacteriaceae species NOT DETECTED NOT DETECTED Final   Enterobacter cloacae complex NOT DETECTED NOT DETECTED Final   Escherichia coli NOT DETECTED NOT DETECTED Final   Klebsiella oxytoca NOT DETECTED NOT DETECTED Final   Klebsiella pneumoniae NOT DETECTED NOT DETECTED Final   Proteus species NOT DETECTED NOT DETECTED Final   Serratia marcescens NOT DETECTED NOT DETECTED Final   Haemophilus influenzae NOT DETECTED NOT DETECTED Final   Neisseria meningitidis NOT DETECTED NOT DETECTED Final   Pseudomonas aeruginosa NOT DETECTED NOT DETECTED Final   Candida albicans NOT DETECTED NOT DETECTED Final   Candida glabrata NOT DETECTED NOT DETECTED Final   Candida krusei NOT DETECTED NOT DETECTED Final   Candida parapsilosis NOT DETECTED NOT DETECTED Final   Candida tropicalis NOT DETECTED NOT DETECTED Final    Comment: Performed at [facility], [address] [place], [place] [address]  Urine culture     Status: Abnormal   Collection Time: 12/15/19  7:11 PM   Specimen: In/Out Cath Urine  Result Value Ref Range Status   Specimen Description IN/OUT CATH URINE  Final   Special Requests   Final    NONE Performed at [facility], [address] [place], [place] [address]    Culture MULTIPLE SPECIES PRESENT, SUGGEST RECOLLECTION (A)  Final   Report Status 12/17/2019 FINAL  Final  MRSA PCR Screening     Status: None   Collection Time: 12/16/19  2:39 AM   Specimen: Nasal Mucosa; Nasopharyngeal  Result Value Ref Range Status   MRSA by PCR NEGATIVE NEGATIVE Final    Comment:        The GeneXpert MRSA Assay (FDA approved for NASAL  specimens only), is one component of a comprehensive MRSA colonization surveillance program. It is not intended to diagnose MRSA infection nor to guide or monitor treatment for MRSA infections. Performed at [facility], [address], [place], [place]  [address]   Culture, blood (routine x 2)     Status: None (Preliminary result)   Collection Time: 12/17/19 10:38 AM   Specimen: BLOOD  Result Value Ref Range Status   Specimen Description BLOOD LEFT ANTECUBITAL  Final   Special Requests   Final    BOTTLES DRAWN AEROBIC AND ANAEROBIC Blood Culture adequate volume   Culture   Final    NO GROWTH < 24 HOURS Performed at [facility], [address], [place], [place] [address]    Report Status PENDING  Incomplete  Culture, blood (routine x 2)     Status: None (Preliminary result)   Collection Time: 12/17/19 10:38 AM   Specimen: BLOOD LEFT HAND  Result Value Ref Range Status   Specimen Description BLOOD LEFT HAND  Final   Special Requests   Final    BOTTLES DRAWN AEROBIC AND ANAEROBIC Blood Culture adequate volume   Culture   Final    NO GROWTH < 24 HOURS Performed at [facility], [address] [place], [place] [address]    Report Status PENDING  Incomplete     Labs: Basic Metabolic Panel: Recent Labs  Lab 12/15/19 1755 12/16/19 0013 12/16/19 0155 12/17/19 0214 12/18/19 0310  NA 132*  --   --  132* 134*  K 4.6  --   --  4.1 4.2  CL 95*  --   --  105 106  CO2 23  --   --  19* 21*  GLUCOSE 294*  --   --  230* 290*  BUN 25*  --   --  18 17  CREATININE 0.98  --   --  0.71 0.80  CALCIUM 9.5  --   --  8.1* 8.3*  MG  --  1.6* 1.6*  --   --    Liver Function Tests: Recent Labs  Lab 12/15/19 1755  AST 19  ALT 22  ALKPHOS 70  BILITOT 1.0  PROT 6.9  ALBUMIN 3.9   No results for input(s): LIPASE, AMYLASE in the last 168 hours. No results for input(s): AMMONIA in the last 168 hours. CBC: Recent Labs  Lab 12/15/19 1755 12/16/19 0648  12/17/19 0214 12/18/19 0310  WBC 18.8* 16.2* 9.3 9.1  NEUTROABS 16.4*  --   --   --   HGB 16.8 15.0 13.5 14.0  HCT 53.1* 47.2 43.5 43.4  MCV 77.3* 77.6* 78.1* 77.6*  PLT 212 179 153 167   Cardiac Enzymes: No results for input(s): CKTOTAL, CKMB, CKMBINDEX, TROPONINI in the last 168 hours. BNP: BNP (last 3 results) No results for input(s): BNP in the last 8760 hours.  ProBNP (last 3 results) No results for input(s): PROBNP in the last 8760 hours.  CBG: Recent Labs  Lab 12/17/19 1143 12/17/19 1615 12/18/19 0625 12/18/19 0746 12/18/19 1125  GLUCAP 195* 207* 272* 251* 211*       Signed:  Nelta Caudill  [facility] 12/18/2019, 1:44 PM

## 2019-12-18 NOTE — Progress Notes (Signed)
Pt discharge home with wife. Discharge information given to pt. PIV removed. All questions answered to pt satisfaction.

## 2019-12-18 NOTE — Discharge Instructions (Signed)
Sepsis, Diagnosis, Adult Sepsis is a serious bodily reaction to an infection. The infection that triggers sepsis may be from a bacteria, virus, or fungus. Sepsis can result from an infection in any part of your body. Infections that commonly lead to sepsis include skin, lung, and urinary tract infections. Sepsis is a medical emergency that must be treated right away in a hospital. In severe cases, it can lead to septic shock. Septic shock can weaken your heart and cause your blood pressure to drop. This can cause your central nervous system and your body's organs to stop working. What are the causes? This condition is caused by a severe reaction to infections from bacteria, viruses, or fungus. The germs that most often lead to sepsis include:  Escherichia coli (E. coli) bacteria.  Staphylococcus aureus (staph) bacteria.  Some types of Streptococcus bacteria. The most common infections affect these organs:  The lung (pneumonia).  The kidneys or bladder (urinary tract infection).  The skin (cellulitis).  The bowel, gallbladder, or pancreas. What increases the risk? You are more likely to develop this condition if:  Your body's disease-fighting system (immune system) is weakened.  You are age 65 or older.  You are male.  You had surgery or you have been hospitalized.  You have these devices inserted into your body: ? A small, thin tube (catheter). ? IV line. ? Breathing tube. ? Drainage tube.  You are not getting enough nutrients from food (malnourished).  You have a long-term (chronic) disease, such as cancer, lung disease, kidney disease, or diabetes.  You are African American. What are the signs or symptoms? Symptoms of this condition may include:  Fever.  Chills or feeling very cold.  Confusion or anxiety.  Fatigue.  Muscle aches.  Shortness of breath.  Nausea and vomiting.  Urinating much less than usual.  Fast heart rate (tachycardia).  Rapid  breathing (hyperventilation).  Changes in skin color. Your skin may look blotchy, pale, or blue.  Cool, clammy, or sweaty skin.  Skin rash. Other symptoms depend on the source of your infection. How is this diagnosed? This condition is diagnosed based on:  Your symptoms.  Your medical history.  A physical exam. Other tests may also be done to find out the cause of the infection and how severe the sepsis is. These tests may include:  Blood tests.  Urine tests.  Swabs from other areas of your body that may have an infection. These samples may be tested (cultured) to find out what type of bacteria is causing the infection.  Chest X-ray to check for pneumonia. Other imaging tests, such as a CT scan, may also be done.  Lumbar puncture. This removes a small amount of the fluid that surrounds your brain and spinal cord. The fluid is then examined for infection. How is this treated? This condition must be treated in a hospital. Based on the cause of your infection, you may be given an antibiotic, antiviral, or antifungal medicine. You may also receive:  Fluids through an IV.  Oxygen and breathing assistance.  Medicines to increase your blood pressure.  Kidney dialysis. This process cleans your blood if your kidneys have failed.  Surgery to remove infected tissue.  Blood transfusion if needed.  Medicine to prevent blood clots.  Nutrients to correct imbalances in basic body function (metabolism). You may: ? Receive important salts and minerals (electrolytes) through an IV. ? Have your blood sugar level adjusted. Follow these instructions at home: Medicines   Take over-the-counter and   prescription medicines only as told by your health care provider.  If you were prescribed an antibiotic, antiviral, or antifungal medicine, take it as told by your health care provider. Do not stop taking the medicine even if you start to feel better. General instructions  If you have a  catheter or other indwelling device, ask to have it removed as soon as possible.  Keep all follow-up visits as told by your health care provider. This is important. Contact a health care provider if:  You do not feel like you are getting better or regaining strength.  You are having trouble coping with your recovery.  You frequently feel tired.  You feel worse or do not seem to get better after surgery.  You think you may have an infection after surgery. Get help right away if:  You have any symptoms of sepsis.  You have difficulty breathing.  You have a rapid or skipping heartbeat.  You become confused or disoriented.  You have a high fever.  Your skin becomes blotchy, pale, or blue.  You have an infection that is getting worse or not getting better. These symptoms may represent a serious problem that is an emergency. Do not wait to see if the symptoms will go away. Get medical help right away. Call your local emergency services (911 in the [place] Do not drive yourself to the hospital. Summary  Sepsis is a medical emergency that requires immediate treatment in a hospital.  This condition is caused by a severe reaction to infections from bacteria, viruses, or fungus.  Based on the cause of your infection, you may be given an antibiotic, antiviral, or antifungal medicine.  Treatment may also include IV fluids, breathing assistance, and kidney dialysis. This information is not intended to replace advice given to you by your health care provider. Make sure you discuss any questions you have with your health care provider. Document Revised: 02/13/2018 Document Reviewed: 02/13/2018 Elsevier Patient Education  2020 Elsevier Inc.  

## 2019-12-18 NOTE — Evaluation (Signed)
Physical Therapy Evaluation Patient Details Name: Chad Maddox MRN: [id number] DOB: 1956/04/18 Today's Date: 12/18/2019   History of Present Illness  64 y.o. male with history of CAD, diabetes mellitus last hemoglobin A1c in January was around 11, hyperlipidemia who has been having a chronic right foot ulceration since Sept 2020 around the first metatarsal and is being followed by orthopedics. Developed sepsis. MRI negative for osteomyelitis  Clinical Impression   Patient evaluated by Physical Therapy with no further acute PT needs identified. All education has been completed and the patient has no further questions. Patient has been using a Darco shoe on right foot for ~6 months. Reports he does better when he has his shoe for left foot (his wife is bringing his shoes).  PT is signing off. Thank you for this referral.     Follow Up Recommendations No PT follow up    Equipment Recommendations  None recommended by PT    Recommendations for Other Services       Precautions / Restrictions Precautions Precautions: Fall Precaution Comments: due to use of Darco shoe Required Braces or Orthoses: Other Brace Other Brace: rt darco shoe Restrictions Weight Bearing Restrictions: Yes RLE Weight Bearing: Partial weight bearing RLE Partial Weight Bearing Percentage or Pounds: thru heel      Mobility  Bed Mobility                  Transfers Overall transfer level: Independent Equipment used: None             General transfer comment: from EOB with darco shoe  Ambulation/Gait Ambulation/Gait assistance: Min guard Gait Distance (Feet): 6 Feet Assistive device: None Gait Pattern/deviations: Step-to pattern;Decreased stride length     General Gait Details: pt did not have his left shoe to help "balance" him out; felt floor was slippery and did not want to walk farther  Stairs            Wheelchair Mobility    Modified Rankin (Stroke Patients Only)        Balance Overall balance assessment: Independent Sitting-balance support: No upper extremity supported;Feet supported Sitting balance-Leahy Scale: Normal Sitting balance - Comments: able to reach to floor for donning darco shoe   Standing balance support: No upper extremity supported Standing balance-Leahy Scale: Good   Single Leg Stance - Right Leg: 7(in Darco shoe) Single Leg Stance - Left Leg: 5         High level balance activites: Backward walking;Sudden stops               Pertinent Vitals/Pain Pain Assessment: Faces Faces Pain Scale: Hurts whole lot Pain Location: left shoulder Pain Descriptors / Indicators: Discomfort;Grimacing;Guarding Pain Intervention(s): Limited activity within patient's tolerance(pt awaiting lt shoulder surgery)    Home Living Family/patient expects to be discharged to:: Private residence Living Arrangements: Spouse/significant other Available Help at Discharge: Family Type of Home: House Home Access: Level entry     Home Layout: Able to live on main level with bedroom/bathroom;Laundry or work area in [place]: Kasandra Knudsen - single point;Crutches;Grab bars - tub/shower;Shower seat - built in(walking stick; Darco shoe)      Prior Function Level of Independence: Independent with assistive device(s)         Comments: uses walking stick if anything     Hand Dominance        Extremity/Trunk Assessment   Upper Extremity Assessment Upper Extremity Assessment: LUE deficits/detail LUE Deficits / Details: severely limited shoulder ROM/strength due to artthritis  Lower Extremity Assessment Lower Extremity Assessment: RLE deficits/detail RLE Deficits / Details: bandage rt forefoot    Cervical / Trunk Assessment Cervical / Trunk Assessment: Other exceptions Cervical / Trunk Exceptions: overweight  Communication   Communication: No difficulties  Cognition Arousal/Alertness: Awake/alert Behavior During Therapy: WFL  for tasks assessed/performed Overall Cognitive Status: Within Functional Limits for tasks assessed                                        General Comments      Exercises     Assessment/Plan    PT Assessment Patent does not need any further PT services  PT Problem List         PT Treatment Interventions      PT Goals (Current goals can be found in the Care Plan section)  Acute Rehab PT Goals Patient Stated Goal: go home today PT Goal Formulation: All assessment and education complete, DC therapy    Frequency     Barriers to discharge        Co-evaluation               AM-PAC PT "6 Clicks" Mobility  Outcome Measure Help needed turning from your back to your side while in a flat bed without using bedrails?: None Help needed moving from lying on your back to sitting on the side of a flat bed without using bedrails?: None Help needed moving to and from a bed to a chair (including a wheelchair)?: None Help needed standing up from a chair using your arms (e.g., wheelchair or bedside chair)?: None Help needed to walk in hospital room?: A Little Help needed climbing 3-5 steps with a railing? : A Little 6 Click Score: 22    End of Session Equipment Utilized During Treatment: Gait belt Activity Tolerance: Patient tolerated treatment well Patient left: in bed;with call bell/phone within reach Nurse Communication: Mobility status;Other (comment)(no PT needs) PT Visit Diagnosis: Other abnormalities of gait and mobility (R26.89)    Time: 8295-6213 PT Time Calculation (min) (ACUTE ONLY): 16 min   Charges:   PT Evaluation $PT Eval Low Complexity: 1 Low           Jerolyn Center, PT Pager [phone]   Chad Maddox 12/18/2019, 1:37 PM

## 2019-12-20 ENCOUNTER — Encounter: Payer: Self-pay | Admitting: Orthopedic Surgery

## 2019-12-20 LAB — CULTURE, BLOOD (ROUTINE X 2)
Culture: NO GROWTH
Special Requests: ADEQUATE

## 2019-12-20 NOTE — Progress Notes (Signed)
Office Visit Note   Patient: Chad Maddox           Date of Birth: Jun 26, 1956           MRN: [id number] Visit Date: 12/15/2019 Requested by: No referring provider defined for this encounter. PCP: System, Pcp Not In  Subjective: Chief Complaint  Patient presents with  . Right Foot - Follow-up    MRI review    HPI: Chad Maddox is a 64 y.o. male who presents to the office complaining of right foot pain.  He has been seeing podiatry for several months for a chronic ulceration of the plantar aspect of his right foot.  They have been performing serial debridement.  He notes that it has been worse in the past several weeks.  He had an MRI of the right foot due to the chronic nature of the wound to make sure there is no evidence of osteomyelitis.  MRI reviewed today and shows superficial soft tissue ulceration with cellulitis without evidence of osteomyelitis.  His last A1c was 11.0 on 11/29/2019.  He does note that he has been feeling unwell in the last several hours.  He has had no recent illnesses.  He feels nauseous and like he wants to vomit.  He notes chills and cannot seem to get warm even with multiple blankets.  His temperature was measured at 100.1 and then at 100.6.  Blood pressure was measured and he was found to not be hypotensive.              ROS:  All systems reviewed are negative as they relate to the chief complaint within the history of present illness.  Patient denies fevers or chills.  Assessment & Plan: Visit Diagnoses:  1. Rotator cuff arthropathy of left shoulder   2. Chills with fever   3. Non-pressure chronic ulcer of other part of right foot with fat layer exposed (HCC)     Plan: Patient is a 64 year old male who presents complaining of right foot pain and left shoulder pain.  He has been waiting for his A1c to come down to below 8 to proceed with left shoulder replacement.  He has had difficulty with managing his blood glucose with his last A1c at 11.0 on  11/29/2019.  Referred patient today for pain management of his left shoulder; hopefully with pain management, patient can avoid "drinking whiskey" for pain control.  This may help with his blood glucose control as well, allowing him to be set up for surgery in the future.  He is here for MRI review of the right foot due to chronic foot ulcer.  MRI revealed no evidence of osteomyelitis.  More concerning today was patient's acute onset of chills with nausea and dizziness.  Vitals taken today and showed an elevated temperature.  No hypotension.  EMS was called for concern of sepsis.  He was transferred to the emergency department for management.  Dr. Lajoyce Corners did graciously evaluate his foot in debrided an ulcer which showed no abscess or cellulitis.  Due to patient's ongoing evidence of systemic illness he was transferred for further management and work-up on the medical service at [facility].  Follow-Up Instructions: No follow-ups on file.   Orders:  Orders Placed This Encounter  Procedures  . Ambulatory referral to Physical Medicine Rehab   No orders of the defined types were placed in this encounter.     Procedures: No procedures performed   Clinical Data: No additional findings.  Objective: Vital Signs:  Temp (!) 100.6 F (38.1 C)   Ht 5\' 10"  (1.778 m)   Wt 220 lb (99.8 kg)   BMI 31.57 kg/m   Physical Exam:  Constitutional: Patient appears ill, unable to get warm HEENT:  Head: Normocephalic Eyes:EOM are normal Neck: Normal range of motion Cardiovascular: Normal rate Pulmonary/chest: Effort normal Neurologic: Patient is alert Skin: Skin is warm Psychiatric: Patient has normal mood and affect  Ortho Exam:  Right foot exam Right foot ulcer located on the plantar aspect 1st metatarsal head.  Drainage present around the ulcer but none is expressible.1+ DP pulse of the right foot.  Sensation intact but diminished throughout the right foot.  No palpable PT pulse.  Specialty  Comments:  No specialty comments available.  Imaging: No results found.   PMFS History: Patient Active Problem List   Diagnosis Date Noted  . Sepsis ([place]) 12/15/2019  . Cellulitis of right foot 12/15/2019  . Atrial fibrillation with RVR ([place]) 12/15/2019  . Hyperlipidemia 04/19/2016  . HYPERTENSION, BENIGN 07/12/2010  . CAD, NATIVE VESSEL 06/23/2009  . Diabetes mellitus type 2 in obese ([place]) 10/28/2008  . OBESITY 10/28/2008  . LEUKOCYTOSIS 10/28/2008  . GERD 10/28/2008   Past Medical History:  Diagnosis Date  . Coronary artery disease    s/p NSTEMI 01/2008  -- s/p BMS to RCA 01/2008  --  s/p XIENCE DES TO OM2 12/2008  --  OTW NONOBS DZS AND PATENT RCA STENT, EF 60%   . Diabetes mellitus   . GERD (gastroesophageal reflux disease)   . Hyperlipidemia, mixed   . Hypertension   . Obesity     Family History  Problem Relation Age of Onset  . Cerebral aneurysm Mother   . Diabetes Mother   . COPD Father   . Bone cancer Father   . Lung cancer Father   . Lymphoma Sister     Past Surgical History:  Procedure Laterality Date  . CARDIAC CATHETERIZATION  12/22/2008    Triple-vessel coronary artery disease -- Patent stent in the proximal right coronary artery -- Severe stenosis of the second obtuse marginal branch -- Successful percutaneous coronary intervention with placement of a Xience drug-eluting stent in the second obtuse marginal branch.  -- Normal left ventricular systolic function   Social History   Occupational History  . Not on file  Tobacco Use  . Smoking status: Never Smoker  . Smokeless tobacco: Never Used  Substance and Sexual Activity  . Alcohol use: No  . Drug use: No  . Sexual activity: Not on file

## 2019-12-22 LAB — CULTURE, BLOOD (ROUTINE X 2)
Culture: NO GROWTH
Culture: NO GROWTH
Special Requests: ADEQUATE
Special Requests: ADEQUATE

## 2019-12-25 ENCOUNTER — Other Ambulatory Visit: Payer: Medicare Other

## 2019-12-27 ENCOUNTER — Ambulatory Visit: Payer: Medicare Other | Admitting: Orthopedic Surgery

## 2019-12-27 ENCOUNTER — Ambulatory Visit (INDEPENDENT_AMBULATORY_CARE_PROVIDER_SITE_OTHER): Payer: Medicare Other | Admitting: Orthopedic Surgery

## 2019-12-27 ENCOUNTER — Encounter: Payer: Self-pay | Admitting: Orthopedic Surgery

## 2019-12-27 VITALS — Ht 70.0 in | Wt 225.0 lb

## 2019-12-27 DIAGNOSIS — L97512 Non-pressure chronic ulcer of other part of right foot with fat layer exposed: Secondary | ICD-10-CM | POA: Diagnosis not present

## 2019-12-27 DIAGNOSIS — M12812 Other specific arthropathies, not elsewhere classified, left shoulder: Secondary | ICD-10-CM

## 2019-12-27 NOTE — Progress Notes (Signed)
Office Visit Note   Patient: Chad Maddox           Date of Birth: [birth year]           MRN: [id number] Visit Date: 12/27/2019              Requested by: No referring provider defined for this encounter. 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  He is currently ambulating with a crutch a Darco shoe and states he has been keeping off his foot.  Assessment & Plan: Visit Diagnoses:  1. Rotator cuff arthropathy of left shoulder   2. Non-pressure chronic ulcer of other part of right foot with fat layer exposed ([place])     Plan: Continue with the current care with Dial soap cleansing dry dressing change Darco shoe and crutch follow-up in 3 weeks at which time anticipate patient can proceed with his total shoulder arthroplasty.  Follow-Up Instructions: Return in about 3 weeks (around 01/17/2020).   Ortho Exam  Patient is alert, oriented, no adenopathy, well-dressed, normal affect, normal respiratory effort. Examination patient's ulcer has healed quite well and was 20 mm in diameter now with 5 mm diameter 0.1 mm deep there is healthy granulation tissue at the base no cellulitis no drainage no odor no signs of infection.  Patient's hemoglobin A1c has been consistently elevated around 11.  Imaging: No results found. No images are attached to the encounter.  Labs: Lab Results  Component Value Date   HGBA1C 11.0 (H) 11/29/2019   HGBA1C 11.7 (H) 08/18/2019   HGBA1C 9.0 (H) 03/29/2009   REPTSTATUS 12/22/2019 FINAL 12/17/2019   REPTSTATUS 12/22/2019 FINAL 12/17/2019   CULT  12/17/2019    NO GROWTH 5 DAYS Performed at [facility], [place] [address] [place], [place] [address]    CULT  12/17/2019    NO  GROWTH 5 DAYS Performed at [facility] [address], [place], [place] [address]    LABORGA GROUP B STREP(S.AGALACTIAE)ISOLATED 12/15/2019     Lab Results  Component Value Date   ALBUMIN 3.9 12/15/2019   ALBUMIN 4.2 03/29/2009   ALBUMIN 3.9 12/22/2008    Lab Results  Component Value Date   MG 1.6 (L) 12/16/2019   MG 1.6 (L) 12/16/2019   MG 2.0 01/19/2008   No results found for: VD25OH  No results found for: PREALBUMIN CBC EXTENDED Latest Ref Rng & Units 12/18/2019 12/17/2019 12/16/2019  WBC 4.0 - 10.5 K/uL 9.1 9.3 16.2(H)  RBC 4.22 - 5.81 MIL/uL 5.59 5.57 6.08(H)  HGB 13.0 - 17.0 g/dL 14.0 13.5 15.0  HCT 39.0 - 52.0 % 43.4 43.5 47.2  PLT 150 - 400 K/uL 167 153 179  NEUTROABS 1.7 - 7.7 K/uL - - -  LYMPHSABS 0.7 - 4.0 K/uL - - -     Body mass index is 32.28 kg/m.  Orders:  No orders of the defined types were placed in this encounter.  No orders of the defined types were placed in this encounter.    Procedures: No procedures performed  Clinical Data: No additional findings.  ROS:  All other systems negative, except as noted in the HPI. Review of Systems  Objective: Vital Signs:  Ht 5\' 10"  (1.778 m)    Wt 225 lb (102.1 kg)    BMI 32.28 kg/m   Specialty Comments:  No specialty comments available.  PMFS History: Patient Active Problem List   Diagnosis Date Noted   Sepsis (HCC) 12/15/2019   Cellulitis of right foot 12/15/2019   Atrial fibrillation with RVR (HCC) 12/15/2019   Hyperlipidemia 04/19/2016   HYPERTENSION, BENIGN 07/12/2010   CAD, NATIVE VESSEL 06/23/2009   Diabetes mellitus type 2 in obese (HCC) 10/28/2008   OBESITY 10/28/2008   LEUKOCYTOSIS 10/28/2008   GERD 10/28/2008   Past Medical History:  Diagnosis Date   Coronary artery disease    s/p NSTEMI 01/2008  -- s/p BMS to RCA 01/2008  --  s/p XIENCE DES TO OM2 12/2008  --  OTW NONOBS DZS AND PATENT RCA STENT, EF 60%    Diabetes mellitus    GERD (gastroesophageal reflux  disease)    Hyperlipidemia, mixed    Hypertension    Obesity     Family History  Problem Relation Age of Onset   Cerebral aneurysm Mother    Diabetes Mother    COPD Father    Bone cancer Father    Lung cancer Father    Lymphoma Sister     Past Surgical History:  Procedure Laterality Date   CARDIAC CATHETERIZATION  12/22/2008    Triple-vessel coronary artery disease -- Patent stent in the proximal right coronary artery -- Severe stenosis of the second obtuse marginal branch -- Successful percutaneous coronary intervention with placement of a Xience drug-eluting stent in the second obtuse marginal branch.  -- Normal left ventricular systolic function   Social History   Occupational History   Not on file  Tobacco Use   Smoking status: Never Smoker   Smokeless tobacco: Never Used  Substance and Sexual Activity   Alcohol use: No   Drug use: No   Sexual activity: Not on file

## 2020-01-10 ENCOUNTER — Telehealth: Payer: Self-pay | Admitting: Orthopedic Surgery

## 2020-01-10 NOTE — Telephone Encounter (Signed)
Patient's wife Chad Maddox called requesting a call back from Dr. August Saucer office. [name] needs pain management address and phone number. Patient's wife said she misplaced it. Patient phone number is [phone].

## 2020-01-10 NOTE — Telephone Encounter (Signed)
Can you please help me look at this to find facility patient was referred to?

## 2020-01-11 NOTE — Telephone Encounter (Signed)
Called, spoke with patient.  [facility] Address: [address], [place], [place] [address] Phone: [phone]

## 2020-01-17 ENCOUNTER — Ambulatory Visit: Payer: Medicare Other | Admitting: Orthopedic Surgery

## 2020-01-17 ENCOUNTER — Encounter: Payer: Self-pay | Admitting: Orthopedic Surgery

## 2020-01-17 VITALS — Ht 70.0 in | Wt 225.0 lb

## 2020-01-17 DIAGNOSIS — L97512 Non-pressure chronic ulcer of other part of right foot with fat layer exposed: Secondary | ICD-10-CM | POA: Diagnosis not present

## 2020-01-17 DIAGNOSIS — M12812 Other specific arthropathies, not elsewhere classified, left shoulder: Secondary | ICD-10-CM

## 2020-01-19 ENCOUNTER — Encounter: Payer: Self-pay | Admitting: Orthopedic Surgery

## 2020-01-19 NOTE — Progress Notes (Signed)
Office Visit Note   Patient: Chad Maddox           Date of Birth: 1955/08/28           MRN: [id number] Visit Date: 01/17/2020              Requested by: No referring provider defined for this encounter. PCP: System, Pcp Not In  Chief Complaint  Patient presents with  . Right Foot - Wound Check      HPI: Patient is a 64 year old gentleman who presents for 2 separate issues.  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  Patient states he cannot sleep secondary to his shoulder pain and he states he will be proceeding with a total shoulder arthroplasty once the ulcer is healed.  Assessment & Plan: Visit Diagnoses:  1. Rotator cuff arthropathy of left shoulder   2. 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  Patient has good custom orthotics to help unload pressure from the first metatarsal head.  Follow-Up Instructions: Return in about 4 weeks (around 02/14/2020).   Ortho Exam  Patient is alert, oriented, no adenopathy, well-dressed, normal affect, normal respiratory effort. Examination patient has a Wagner grade 1 ulcer plantar aspect of the right foot.  After informed consent a 10 blade knife was used to debride the skin and soft tissue back to healthy viable granulation tissue ulcer before debridement was 5 mm in diameter and 10 mm in diameter after debridement and 1 mm deep.  Patient's orthotics were reviewed and these have been modified to unload pressure from the first metatarsal head.  Imaging: No results found. No images are attached to the encounter.  Labs: Lab Results  Component Value Date   HGBA1C 11.0 (H) 11/29/2019   HGBA1C 11.7 (H) 08/18/2019   HGBA1C 9.0 (H) 03/29/2009   REPTSTATUS 12/22/2019 FINAL 12/17/2019   REPTSTATUS 12/22/2019 FINAL 12/17/2019   CULT  12/17/2019    NO  GROWTH 5 DAYS Performed at [facility], [address] [place], [place] [address]    CULT  12/17/2019    NO GROWTH 5 DAYS Performed at [facility], [address] [place], [place] [address]    LABORGA GROUP B STREP(S.AGALACTIAE)ISOLATED 12/15/2019     Lab Results  Component Value Date   ALBUMIN 3.9 12/15/2019   ALBUMIN 4.2 03/29/2009   ALBUMIN 3.9 12/22/2008    Lab Results  Component Value Date   MG 1.6 (L) 12/16/2019   MG 1.6 (L) 12/16/2019   MG 2.0 01/19/2008   No results found for: VD25OH  No results found for: PREALBUMIN CBC EXTENDED Latest Ref Rng & Units 12/18/2019 12/17/2019 12/16/2019  WBC 4.0 - 10.5 K/uL 9.1 9.3 16.2(H)  RBC 4.22 - 5.81 MIL/uL 5.59 5.57 6.08(H)  HGB 13.0 - 17.0 g/dL 24.5 80.9 98.3  HCT 39 - 52 % 43.4 43.5 47.2  PLT 150 - 400 K/uL 167 153 179  NEUTROABS 1.7 - 7.7 K/uL - - -  LYMPHSABS 0.7 - 4.0 K/uL - - -     Body mass index is 32.28 kg/m.  Orders:  No orders of the defined types were placed in this encounter.  No orders of the defined types were placed in this encounter.    Procedures: No procedures performed  Clinical  Data: No additional findings.  ROS:  All other systems negative, except as noted in the HPI. Review of Systems  Objective: Vital Signs: Ht 5\' 10"  (1.778 m)   Wt 225 lb (102.1 kg)   BMI 32.28 kg/m   Specialty Comments:  No specialty comments available.  PMFS History: Patient Active Problem List   Diagnosis Date Noted  . Sepsis (HCC) 12/15/2019  . Cellulitis of right foot 12/15/2019  . Atrial fibrillation with RVR (HCC) 12/15/2019  . Hyperlipidemia 04/19/2016  . HYPERTENSION, BENIGN 07/12/2010  . CAD, NATIVE VESSEL 06/23/2009  . Diabetes mellitus type 2 in obese (HCC) 10/28/2008  . OBESITY 10/28/2008  . LEUKOCYTOSIS 10/28/2008  . GERD 10/28/2008   Past Medical History:  Diagnosis Date  . Coronary artery disease    s/p NSTEMI 01/2008  -- s/p BMS to RCA 01/2008  --  s/p XIENCE DES TO  OM2 12/2008  --  OTW NONOBS DZS AND PATENT RCA STENT, EF 60%   . Diabetes mellitus   . GERD (gastroesophageal reflux disease)   . Hyperlipidemia, mixed   . Hypertension   . Obesity     Family History  Problem Relation Age of Onset  . Cerebral aneurysm Mother   . Diabetes Mother   . COPD Father   . Bone cancer Father   . Lung cancer Father   . Lymphoma Sister     Past Surgical History:  Procedure Laterality Date  . CARDIAC CATHETERIZATION  12/22/2008    Triple-vessel coronary artery disease -- Patent stent in the proximal right coronary artery -- Severe stenosis of the second obtuse marginal branch -- Successful percutaneous coronary intervention with placement of a Xience drug-eluting stent in the second obtuse marginal branch.  -- Normal left ventricular systolic function   Social History   Occupational History  . Not on file  Tobacco Use  . Smoking status: Never Smoker  . Smokeless tobacco: Never Used  Substance and Sexual Activity  . Alcohol use: No  . Drug use: No  . Sexual activity: Not on file

## 2020-01-28 NOTE — Progress Notes (Signed)
Chad Maddox Date of Birth: May 09, 1956 Medical Record #[id number]  History of Present Illness: [name] is seen for follow up CAD. He has a history of CAD s/p NSTEMI in 2009 with BMS of the RCA. In 2010 he had stenting of the OM2 with a DES.   He was admitted at the end of May 2021 with sepsis due to a nonhealing ulcer on his right foot with cellulitis but no osteomyelitis. Managed with antibiotics. Echo was unremarkable. Arterial doppler studies were normal.  He reports he has done very well from a cardiac standpoint without recurrent chest pain or SOB. He has been nonweight bearing on his foot so he has gained 25 lbs.   His labs are followed by his primary care in [place], Dr. Dewayne Shorter. He also has a history of DM type 2, HTN, and hyperlipidemia.   Current Outpatient Medications on File Prior to Visit  Medication Sig Dispense Refill   aspirin 81 MG tablet Take 81 mg by mouth daily.       canagliflozin (INVOKANA) 100 MG TABS tablet Take 100 mg by mouth daily.     glipiZIDE (GLUCOTROL XL) 10 MG 24 hr tablet Take 10 mg by mouth daily.       lisinopril (PRINIVIL,ZESTRIL) 20 MG tablet Take 1 tablet (20 mg total) by mouth daily. 90 tablet 3   metFORMIN (GLUCOPHAGE-XR) 500 MG 24 hr tablet Take 1,000 mg by mouth 2 (two) times daily.       metoprolol tartrate (LOPRESSOR) 25 MG tablet Take 1 tablet (25 mg total) by mouth 2 (two) times daily. 180 tablet 3   nitroGLYCERIN (NITROSTAT) 0.4 MG SL tablet Place 1 tablet (0.4 mg total) under the tongue every 5 (five) minutes as needed. (Patient taking differently: Place 0.4 mg under the tongue every 5 (five) minutes as needed for chest pain. ) 25 tablet 11   rosuvastatin (CRESTOR) 40 MG tablet TAKE 1 TABLET BY MOUTH  DAILY 90 tablet 1   traMADol (ULTRAM) 50 MG tablet Take 1 tablet (50 mg total) by mouth every 8 (eight) hours as needed. 42 tablet 0   No current facility-administered medications on file prior to visit.     Allergies  Allergen Reactions   Shellfish Allergy Shortness Of Breath and Swelling   Penicillins Other (See Comments)    Occurred as an infant; reaction unknown.    Past Medical History:  Diagnosis Date   Coronary artery disease    s/p NSTEMI 01/2008  -- s/p BMS to RCA 01/2008  --  s/p XIENCE DES TO OM2 12/2008  --  OTW NONOBS DZS AND PATENT RCA STENT, EF 60%    Diabetes mellitus    GERD (gastroesophageal reflux disease)    Hyperlipidemia, mixed    Hypertension    Obesity     Past Surgical History:  Procedure Laterality Date   CARDIAC CATHETERIZATION  12/22/2008    Triple-vessel coronary artery disease -- Patent stent in the proximal right coronary artery -- Severe stenosis of the second obtuse marginal branch -- Successful percutaneous coronary intervention with placement of a Xience drug-eluting stent in the second obtuse marginal branch.  -- Normal left ventricular systolic function    Social History   Tobacco Use  Smoking Status Never Smoker  Smokeless Tobacco Never Used    Social History   Substance and Sexual Activity  Alcohol Use No    Family History  Problem Relation Age of Onset   Cerebral aneurysm Mother  Diabetes Mother    COPD Father    Bone cancer Father    Lung cancer Father    Lymphoma Sister     Review of Systems: The review of systems is positive for chronic right hip pain.  All other systems were reviewed and are negative.  Physical Exam: BP 115/71    Pulse 72    Temp (!) 96.9 F (36.1 C)    Ht 5\' 10"  (1.778 m)    Wt 250 lb (113.4 kg)    SpO2 93%    BMI 35.87 kg/m  GENERAL:  Well appearing, obese WM in NAD HEENT:  PERRL, EOMI, sclera are clear. Oropharynx is clear. NECK:  No jugular venous distention, carotid upstroke brisk and symmetric, no bruits, no thyromegaly or adenopathy LUNGS:  Clear to auscultation bilaterally CHEST:  Unremarkable HEART:  RRR,  PMI not displaced or sustained,S1 and S2 within normal limits, no  S3, no S4: no clicks, no rubs, no murmurs ABD:  Soft, nontender. BS +, no masses or bruits. No hepatomegaly, no splenomegaly EXT:  2 + pulses throughout, no edema, no cyanosis no clubbing. Tremor right arm NEURO:  Alert and oriented x 3. Cranial nerves II through XII intact. PSYCH:  Cognitively intact    LABORATORY DATA:  Lab Results  Component Value Date   WBC 9.1 12/18/2019   HGB 14.0 12/18/2019   HCT 43.4 12/18/2019   PLT 167 12/18/2019   GLUCOSE 290 (H) 12/18/2019   CHOL  12/23/2008    158        ATP III CLASSIFICATION:  <200     mg/dL   Desirable  02/22/2009  mg/dL   Borderline High  030-092    mg/dL   High          TRIG >=330 (H) 12/23/2008   HDL 29 (L) 12/23/2008   LDLDIRECT 64.6 06/27/2008   LDLCALC  12/23/2008    76        Total Cholesterol/HDL:CHD Risk Coronary Heart Disease Risk Table                     Men   Women  1/2 Average Risk   3.4   3.3  Average Risk       5.0   4.4  2 X Average Risk   9.6   7.1  3 X Average Risk  23.4   11.0        Use the calculated Patient Ratio above and the CHD Risk Table to determine the patient's CHD Risk.        ATP III CLASSIFICATION (LDL):  <100     mg/dL   Optimal  02/22/2009  mg/dL   Near or Above                    Optimal  130-159  mg/dL   Borderline  226-333  mg/dL   High  545-625     mg/dL   Very High   ALT 22 >638   AST 19 12/15/2019   NA 134 (L) 12/18/2019   K 4.2 12/18/2019   CL 106 12/18/2019   CREATININE 0.80 12/18/2019   BUN 17 12/18/2019   CO2 21 (L) 12/18/2019   TSH 0.688 12/16/2019   INR 1.0 12/16/2019   HGBA1C 11.0 (H) 11/29/2019    LE arterial dopplers 12/17/19:Summary:  Right: Resting right ankle-brachial index is within normal range. No  evidence of significant right lower extremity arterial disease. The right  toe-brachial index is normal.   Left: Resting left ankle-brachial index is within normal range. No  evidence of significant left lower extremity arterial disease. The left   toe-brachial index is normal.      *See table(s) above for measurements and observations.      Electronically signed by Coral Else MD on 12/19/2019 at 1:14:15 PM.    Echo 12/17/19: IMPRESSIONS    1. Left ventricular ejection fraction, by estimation, is 55 to 60%. The  left ventricle has normal function. The left ventricle has no regional  wall motion abnormalities. There is mild left ventricular hypertrophy.  Left ventricular diastolic parameters  were normal.  2. Right ventricular systolic function is normal. The right ventricular  size is normal. There is normal pulmonary artery systolic pressure. The  estimated right ventricular systolic pressure is 26.4 mmHg.  3. Left atrial size was mildly dilated.  4. The mitral valve is abnormal. Trivial mitral valve regurgitation.  5. The aortic valve is tricuspid. Aortic valve regurgitation is not  visualized.  6. The inferior vena cava is normal in size with greater than 50%  respiratory variability, suggesting right atrial pressure of 3 mmHg.    Assessment / Plan: 1. CAD s/p BMS of RCA 2009, s/p DES OM2 2010. Asymptomatic. He is on ASA, beta blocker, and high dose statin. Continue risk factor modification. Follow up in one year. Recent Echo normal.  2. HTN- controlled.  3. DM type 2-per primary care. Last A1c 11% when hospitalized with infection.  4. Hyperlipidemia. Continue high dose Crestor. Labs are followed by primary care.  5. Nonhealing wound on ball of foot. Improving. Under care of Dr Lajoyce Corners

## 2020-02-01 ENCOUNTER — Other Ambulatory Visit: Payer: Self-pay

## 2020-02-01 ENCOUNTER — Encounter: Payer: Self-pay | Admitting: Cardiology

## 2020-02-01 ENCOUNTER — Ambulatory Visit: Payer: Medicare Other | Admitting: Cardiology

## 2020-02-01 VITALS — BP 115/71 | HR 72 | Temp 96.9°F | Ht 70.0 in | Wt 250.0 lb

## 2020-02-01 DIAGNOSIS — I10 Essential (primary) hypertension: Secondary | ICD-10-CM

## 2020-02-01 DIAGNOSIS — E7800 Pure hypercholesterolemia, unspecified: Secondary | ICD-10-CM | POA: Diagnosis not present

## 2020-02-01 DIAGNOSIS — I25118 Atherosclerotic heart disease of native coronary artery with other forms of angina pectoris: Secondary | ICD-10-CM

## 2020-02-14 ENCOUNTER — Other Ambulatory Visit: Payer: Self-pay

## 2020-02-14 ENCOUNTER — Ambulatory Visit (INDEPENDENT_AMBULATORY_CARE_PROVIDER_SITE_OTHER): Payer: Medicare Other | Admitting: Orthopedic Surgery

## 2020-02-14 ENCOUNTER — Encounter: Payer: Self-pay | Admitting: Orthopedic Surgery

## 2020-02-14 VITALS — Ht 70.0 in | Wt 250.0 lb

## 2020-02-14 DIAGNOSIS — L97512 Non-pressure chronic ulcer of other part of right foot with fat layer exposed: Secondary | ICD-10-CM

## 2020-02-14 NOTE — Progress Notes (Signed)
Office Visit Note   Patient: Chad Maddox           Date of Birth: Jul 08, 1956           MRN: [id number] Visit Date: 02/14/2020              Requested by: No referring provider defined for this encounter. PCP: System, Pcp Not In  Chief Complaint  Patient presents with  . Right Foot - Follow-up      HPI: Patient is a 64 year old gentleman with rotator cuff pathology who has been treated for a Wagner grade 1 ulcer beneath the first metatarsal head right foot.  Patient states he has had no drainage he has new orthotics that are unloading the ulcer.  Patient states he would like to proceed with his shoulder surgery.  Assessment & Plan: Visit Diagnoses:  1. 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  I feel patient is safe to proceed with his shoulder surgery.  Follow-Up Instructions: Return if symptoms worsen or fail to improve.   Ortho Exam  Patient is alert, oriented, no adenopathy, well-dressed, normal affect, normal respiratory effort. Examination patient has callus overlying the entire area that was ulcerated.  After informed consent a 10 blade knife was used to pare the callus there is 1 small area of ulcer that is 2 mm in diameter 1 mm deep that has healthy granulation tissue no cellulitis no drainage no odor no signs of infection his orthotics were reviewed and he has excellent offloading new orthotics.  Imaging: No results found. No images are attached to the encounter.  Labs: Lab Results  Component Value Date   HGBA1C 11.0 (H) 11/29/2019   HGBA1C 11.7 (H) 08/18/2019   HGBA1C 9.0 (H) 03/29/2009   REPTSTATUS 12/22/2019 FINAL 12/17/2019   REPTSTATUS 12/22/2019 FINAL 12/17/2019   CULT  12/17/2019    NO GROWTH 5 DAYS Performed at [facility], [address] [place], [place] [address]    CULT  12/17/2019    NO GROWTH 5  DAYS Performed at [facility], [address] [place], [place] [address]    LABORGA GROUP B STREP(S.AGALACTIAE)ISOLATED 12/15/2019     Lab Results  Component Value Date   ALBUMIN 3.9 12/15/2019   ALBUMIN 4.2 03/29/2009   ALBUMIN 3.9 12/22/2008    Lab Results  Component Value Date   MG 1.6 (L) 12/16/2019   MG 1.6 (L) 12/16/2019   MG 2.0 01/19/2008   No results found for: VD25OH  No results found for: PREALBUMIN CBC EXTENDED Latest Ref Rng & Units 12/18/2019 12/17/2019 12/16/2019  WBC 4.0 - 10.5 K/uL 9.1 9.3 16.2(H)  RBC 4.22 - 5.81 MIL/uL 5.59 5.57 6.08(H)  HGB 13.0 - 17.0 g/dL 44.3 15.4 00.8  HCT 39 - 52 % 43.4 43.5 47.2  PLT 150 - 400 K/uL 167 153 179  NEUTROABS 1.7 - 7.7 K/uL - - -  LYMPHSABS 0.7 - 4.0 K/uL - - -     Body mass index is 35.87 kg/m.  Orders:  No orders of the defined types were placed in this encounter.  No orders of the defined types were placed in this encounter.    Procedures: No procedures performed  Clinical Data: No additional findings.  ROS:  All other systems negative, except as noted in the HPI. Review of Systems  Objective: Vital Signs:  Ht 5\' 10"  (1.778 m)   Wt (!) 250 lb (113.4 kg)   BMI 35.87 kg/m   Specialty Comments:  No specialty comments available.  PMFS History: Patient Active Problem List   Diagnosis Date Noted  . Sepsis (HCC) 12/15/2019  . Cellulitis of right foot 12/15/2019  . Atrial fibrillation with RVR (HCC) 12/15/2019  . Hyperlipidemia 04/19/2016  . HYPERTENSION, BENIGN 07/12/2010  . CAD, NATIVE VESSEL 06/23/2009  . Diabetes mellitus type 2 in obese (HCC) 10/28/2008  . OBESITY 10/28/2008  . LEUKOCYTOSIS 10/28/2008  . GERD 10/28/2008   Past Medical History:  Diagnosis Date  . Coronary artery disease    s/p NSTEMI 01/2008  -- s/p BMS to RCA 01/2008  --  s/p XIENCE DES TO OM2 12/2008  --  OTW NONOBS DZS AND PATENT RCA STENT, EF 60%   . Diabetes mellitus   . GERD (gastroesophageal reflux disease)    . Hyperlipidemia, mixed   . Hypertension   . Obesity     Family History  Problem Relation Age of Onset  . Cerebral aneurysm Mother   . Diabetes Mother   . COPD Father   . Bone cancer Father   . Lung cancer Father   . Lymphoma Sister     Past Surgical History:  Procedure Laterality Date  . CARDIAC CATHETERIZATION  12/22/2008    Triple-vessel coronary artery disease -- Patent stent in the proximal right coronary artery -- Severe stenosis of the second obtuse marginal branch -- Successful percutaneous coronary intervention with placement of a Xience drug-eluting stent in the second obtuse marginal branch.  -- Normal left ventricular systolic function   Social History   Occupational History  . Not on file  Tobacco Use  . Smoking status: Never Smoker  . Smokeless tobacco: Never Used  Substance and Sexual Activity  . Alcohol use: No  . Drug use: No  . Sexual activity: Not on file

## 2020-02-21 ENCOUNTER — Encounter: Payer: Self-pay | Admitting: Physical Medicine & Rehabilitation

## 2020-02-24 ENCOUNTER — Encounter: Payer: Self-pay | Admitting: Orthopedic Surgery

## 2020-02-24 ENCOUNTER — Ambulatory Visit (INDEPENDENT_AMBULATORY_CARE_PROVIDER_SITE_OTHER): Payer: Medicare Other | Admitting: Orthopedic Surgery

## 2020-02-24 DIAGNOSIS — E1169 Type 2 diabetes mellitus with other specified complication: Secondary | ICD-10-CM | POA: Diagnosis not present

## 2020-02-24 DIAGNOSIS — E669 Obesity, unspecified: Secondary | ICD-10-CM

## 2020-02-24 NOTE — Progress Notes (Signed)
Office Visit Note   Patient: Chad Maddox           Date of Birth: [birth year]           MRN: [id number] Visit Date: 02/24/2020 Requested by: No referring provider defined for this encounter. PCP: System, Pcp Not In  Subjective: Chief Complaint  Patient presents with  . Left Shoulder - Pain    HPI: Chad Maddox is a 64 year old patient with left shoulder rotator cuff arthropathy.  When we last saw him he was having significant problem with a foot ulcer.  That has been treated by Dr. Lajoyce Corners.  He is currently here to be reevaluated for left reverse shoulder replacement.  Hemoglobin A1c was 10 in February.  Glucose measurements in May and June were over 200.  He is taking tramadol for pain.  Is having significant pain and disability and functional loss in that left arm.              ROS: All systems reviewed are negative as they relate to the chief complaint within the history of present illness.  Patient denies  fevers or chills.   Assessment & Plan: Visit Diagnoses:  1. Diabetes mellitus type 2 in obese [facility])     Plan: Impression is left shoulder pain from rotator cuff arthropathy.  Need to check hemoglobin A1c to see if where in the 8 range.  Once that gets low enough we can proceed with shoulder replacement.  He will need to get another thin cut CT scan prior to that.  Hemoglobin A1c drawn today.  We will call him with the results.  Follow-up in 8 weeks for clinical recheck.  Follow-Up Instructions: Return in about 8 weeks (around 04/20/2020).   Orders:  Orders Placed This Encounter  Procedures  . HgB A1c   No orders of the defined types were placed in this encounter.     Procedures: No procedures performed   Clinical Data: No additional findings.  Objective: Vital Signs: There were no vitals taken for this visit.  Physical Exam:   Constitutional: Patient appears well-developed HEENT:  Head: Normocephalic Eyes:EOM are normal Neck: Normal range of  motion Cardiovascular: Normal rate Pulmonary/chest: Effort normal Neurologic: Patient is alert Skin: Skin is warm Psychiatric: Patient has normal mood and affect    Ortho Exam: Ortho exam is essentially unchanged.  Has pretty limited functional ability with that left arm.  Deltoid does fire.  Motor sensory function left hand is intact with palpable radial pulse.  Specialty Comments:  No specialty comments available.  Imaging: No results found.   PMFS History: Patient Active Problem List   Diagnosis Date Noted  . Sepsis (HCC) 12/15/2019  . Cellulitis of right foot 12/15/2019  . Atrial fibrillation with RVR (HCC) 12/15/2019  . Hyperlipidemia 04/19/2016  . HYPERTENSION, BENIGN 07/12/2010  . CAD, NATIVE VESSEL 06/23/2009  . Diabetes mellitus type 2 in obese (HCC) 10/28/2008  . OBESITY 10/28/2008  . LEUKOCYTOSIS 10/28/2008  . GERD 10/28/2008   Past Medical History:  Diagnosis Date  . Coronary artery disease    s/p NSTEMI 01/2008  -- s/p BMS to RCA 01/2008  --  s/p XIENCE DES TO OM2 12/2008  --  OTW NONOBS DZS AND PATENT RCA STENT, EF 60%   . Diabetes mellitus   . GERD (gastroesophageal reflux disease)   . Hyperlipidemia, mixed   . Hypertension   . Obesity     Family History  Problem Relation Age of Onset  . Cerebral aneurysm  Mother   . Diabetes Mother   . COPD Father   . Bone cancer Father   . Lung cancer Father   . Lymphoma Sister     Past Surgical History:  Procedure Laterality Date  . CARDIAC CATHETERIZATION  12/22/2008    Triple-vessel coronary artery disease -- Patent stent in the proximal right coronary artery -- Severe stenosis of the second obtuse marginal branch -- Successful percutaneous coronary intervention with placement of a Xience drug-eluting stent in the second obtuse marginal branch.  -- Normal left ventricular systolic function   Social History   Occupational History  . Not on file  Tobacco Use  . Smoking status: Never Smoker  . Smokeless  tobacco: Never Used  Substance and Sexual Activity  . Alcohol use: No  . Drug use: No  . Sexual activity: Not on file

## 2020-02-25 ENCOUNTER — Telehealth: Payer: Self-pay

## 2020-02-25 LAB — HEMOGLOBIN A1C
Hgb A1c MFr Bld: 12.2 % of total Hgb — ABNORMAL HIGH (ref <5.7)
Mean Plasma Glucose: 303 (calc)
eAG (mmol/L): 16.8 (calc)

## 2020-02-25 LAB — EXTRA SPECIMEN

## 2020-02-25 NOTE — Telephone Encounter (Signed)
-----   Message from Cammy Copa, MD sent at 02/25/2020  6:43 AM EDT ----- 11.2 too high for elective shoulder surgery pls clal thx

## 2020-02-25 NOTE — Progress Notes (Signed)
11.2 too high for elective shoulder surgery pls clal thx

## 2020-02-25 NOTE — Progress Notes (Signed)
122

## 2020-02-25 NOTE — Telephone Encounter (Signed)
Patient aware of results and recommendations. °

## 2020-03-02 ENCOUNTER — Telehealth: Payer: Self-pay

## 2020-03-02 NOTE — Telephone Encounter (Signed)
Can you please call patients wife to further discuss? I called her and she states she has questions about 12/15/19 office visit. She states patient was to be seen but was not? Said that the copay was to be applied to another visit?

## 2020-03-02 NOTE — Telephone Encounter (Signed)
Patient wife called in wanting to ask questions about patients visit on 12/15/2019 says to call her after 3:00pm

## 2020-03-10 ENCOUNTER — Encounter: Payer: Medicare Other | Attending: Physical Medicine & Rehabilitation | Admitting: Physical Medicine & Rehabilitation

## 2020-03-10 ENCOUNTER — Other Ambulatory Visit: Payer: Self-pay

## 2020-03-10 ENCOUNTER — Encounter: Payer: Self-pay | Admitting: Physical Medicine & Rehabilitation

## 2020-03-10 VITALS — BP 111/78 | HR 82 | Temp 97.2°F | Ht 70.0 in | Wt 234.6 lb

## 2020-03-10 DIAGNOSIS — M25512 Pain in left shoulder: Secondary | ICD-10-CM | POA: Diagnosis not present

## 2020-03-10 DIAGNOSIS — G8929 Other chronic pain: Secondary | ICD-10-CM | POA: Diagnosis not present

## 2020-03-10 DIAGNOSIS — M25511 Pain in right shoulder: Secondary | ICD-10-CM | POA: Insufficient documentation

## 2020-03-10 NOTE — Progress Notes (Signed)
Subjective:    Patient ID: Chad Maddox, male    DOB: 12/06/1955, 64 y.o.   MRN: [id number]  HPI  CC:  Bilateral shoulder pain 64 year old male referred by his orthopedic surgeon Dr. August Saucer for evaluation of bilateral shoulder pain left greater than right. Rotator cuff arthropathy planning surgery ,Total shoulder Left shoulder was planned for earlier this year. Bacteremia following puncture wound to Right foot requiring hospitalization from May 26 through Dec 18, 2019. Patient indicates that he is now  cleared by Dr Lajoyce Corners in terms of foot infection Oxycodone caused pt to feel "swimmy headed"  Hydrocodone 5 mg BID helped without causing side effects  Tried "edible" for RIght hand tremor  last year but none since that time.  The patient states his right hand tremor was caused by shoulder surgery performed in [place] by an orthopedist that he used to see.  Quit drinking 42 years ago Never smoked cigarettes, smoked marijuana until 2000.  Pain Inventory Average Pain 6 Pain Right Now 8 My pain is sharp, burning and aching  In the last 24 hours, has pain interfered with the following? General activity 10 Relation with others 10 Enjoyment of life 8 What TIME of day is your pain at its worst? daytime and night Sleep (in general) Poor  Pain is worse with: walking, inactivity and some activites Pain improves with: medication Relief from Meds: 7  walk without assistance use a cane how many minutes can you walk? 10 ability to climb steps?  no do you drive?  yes  employed # of hrs/week . 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 Cerebral aneurysm Mother   . Diabetes Mother   . COPD Father   . Bone cancer Father   . Lung cancer Father   . Lymphoma Sister    Social History   Socioeconomic History  . Marital status: Married     Spouse name: Not on file  . Number of children: Not on file  . Years of education: Not on file  . Highest education level: Not on file  Occupational History  . Not on file  Tobacco Use  . Smoking status: Never Smoker  . Smokeless tobacco: Never Used  Substance and Sexual Activity  . Alcohol use: No  . Drug use: No  . Sexual activity: Not on file  Other Topics Concern  . Not on file  Social History Narrative  . Not on file   Social Determinants of Health   Financial Resource Strain:   . Difficulty of Paying Living Expenses: Not on file  Food Insecurity:   . Worried About Programme researcher, broadcasting/film/video in the Last Year: Not on file  . Ran Out of Food in the Last Year: Not on file  Transportation Needs:   . Lack of Transportation (Medical): Not on file  . Lack of Transportation (Non-Medical): Not on file  Physical Activity:   . Days of Exercise per Week: Not on file  . Minutes of Exercise per Session: Not on file  Stress:   . Feeling of Stress : Not on file  Social Connections:   . Frequency of Communication with Friends and Family: Not on file  . Frequency of Social Gatherings with Friends and Family: Not on file  . Attends Religious Services: Not on file  . Active  Member of Clubs or Organizations: Not on file  . Attends Banker Meetings: Not on file  . Marital Status: Not on file   Past Surgical History:  Procedure Laterality Date  . CARDIAC CATHETERIZATION  12/22/2008    Triple-vessel coronary artery disease -- Patent stent in the proximal right coronary artery -- Severe stenosis of the second obtuse marginal branch -- Successful percutaneous coronary intervention with placement of a Xience drug-eluting stent in the second obtuse marginal branch.  -- Normal left ventricular systolic function   Past Medical History:  Diagnosis Date  . Coronary artery disease    s/p NSTEMI 01/2008  -- s/p BMS to RCA 01/2008  --  s/p XIENCE DES TO OM2 12/2008  --  OTW NONOBS DZS AND  PATENT RCA STENT, EF 60%   . Diabetes mellitus   . GERD (gastroesophageal reflux disease)   . Hyperlipidemia, mixed   . Hypertension   . Obesity    BP 111/78   Pulse 82   Temp (!) 97.2 F (36.2 C)   Ht 5\' 10"  (1.778 m)   Wt 234 lb 9.6 oz (106.4 kg)   SpO2 97%   BMI 33.66 kg/m   Opioid Risk Score:   Fall Risk Score:  `1  Depression screen PHQ 2/9  No flowsheet data found.   Review of Systems  Neurological: Positive for tremors.  All other systems reviewed and are negative.      Objective:   Physical Exam Vitals and nursing note reviewed.  Constitutional:      Appearance: He is obese.  HENT:     Head: Normocephalic and atraumatic.  Eyes:     Extraocular Movements: Extraocular movements intact.     Conjunctiva/sclera: Conjunctivae normal.     Pupils: Pupils are equal, round, and reactive to light.  Cardiovascular:     Rate and Rhythm: Normal rate and regular rhythm.     Heart sounds: Normal heart sounds. No murmur heard.   Pulmonary:     Effort: Pulmonary effort is normal. No respiratory distress.     Breath sounds: Normal breath sounds.  Abdominal:     General: Abdomen is flat. Bowel sounds are normal. There is no distension.     Palpations: Abdomen is soft.  Musculoskeletal:     Right shoulder: Deformity, tenderness and crepitus present. Decreased range of motion. Decreased strength.     Left shoulder: Deformity, tenderness and crepitus present. Decreased range of motion. Decreased strength.     Comments: Left shoulder with 50% of normal abduction forward flexion and external rotation Left shoulder with 50% of normal abduction forward flexion and external rotation Full range of motion at the elbows bilaterally as well as wrist and hands as well as normal range of motion in the knees hips and ankles. C-spine mildly reduced rotation bilaterally  Skin:    General: Skin is warm and dry.  Neurological:     General: No focal deficit present.     Mental Status:  He is alert and oriented to person, place, and time.     Cranial Nerves: No dysarthria or facial asymmetry.     Gait: Gait is intact.     Comments: Sensation is normal in bilateral upper extremities There is a tremor in the right hand, pill-rolling  Psychiatric:        Mood and Affect: Mood normal.        Behavior: Behavior normal.            Assessment &  Plan:  #1.  Bilateral chronic shoulder pain secondary to rotator cuff arthropathy.  He has bilateral shoulder contractures and severe functional disability related to limited shoulder range of motion bilaterally. He is scheduled for total shoulder arthroplasty in the left side later this fall. We discussed treatment options including nerve blocks as well as pain medications.  He has tried tramadol without benefit.  He has had side effects with oxycodone.  The hydrocodone did give him some relief he states that he took minimal amounts and feels like he does well with 5 mg twice daily. We discussed need for toxicology.  His opioid risk is moderate and he will need monthly visits. Nurse practitioner visit in 2 weeks.

## 2020-03-10 NOTE — Patient Instructions (Signed)
Need to get results of urine screen before prescribing pain meds

## 2020-03-15 ENCOUNTER — Telehealth: Payer: Self-pay | Admitting: *Deleted

## 2020-03-15 LAB — TOXASSURE SELECT 13 (MW), URINE

## 2020-03-15 NOTE — Telephone Encounter (Signed)
Urine drug screen is positive for THC. °

## 2020-03-16 NOTE — Telephone Encounter (Signed)
Because of positive THC, would not order hydrocodone.  Can offer tramadol 50 mg twice daily.  Otherwise nonnarcotic please inform patient

## 2020-03-17 NOTE — Telephone Encounter (Signed)
I have notified him.  He says he hasn't smoked in years but did eat some edibles on the 4th of July.  He declined the tramadol and understands there will be nothing stronger prescribed.

## 2020-03-17 NOTE — Addendum Note (Signed)
Addended by: Doreene Eland on: 03/17/2020 09:39 AM   Modules accepted: Orders

## 2020-03-24 ENCOUNTER — Telehealth: Payer: Self-pay

## 2020-03-24 NOTE — Telephone Encounter (Signed)
pTNS WIFE CALLED STATES DUPLICATE RECEIPT DID NOT ARRIVE - I MAILED AGAIN

## 2020-03-27 ENCOUNTER — Other Ambulatory Visit: Payer: Self-pay | Admitting: Cardiology

## 2020-04-07 ENCOUNTER — Encounter: Payer: Medicare Other | Admitting: Registered Nurse

## 2020-12-30 IMAGING — DX DG FOOT COMPLETE 3+V*R*
3 series · 3 of 3 positions shown · non-contrast
Comparison: 12/01/2019, 11/29/2019

CLINICAL DATA: Wound infection plantar aspect right first
metatarsophalangeal joint, fever, nausea

EXAM:
RIGHT FOOT COMPLETE - 3+ VIEW

[x foot ap right]
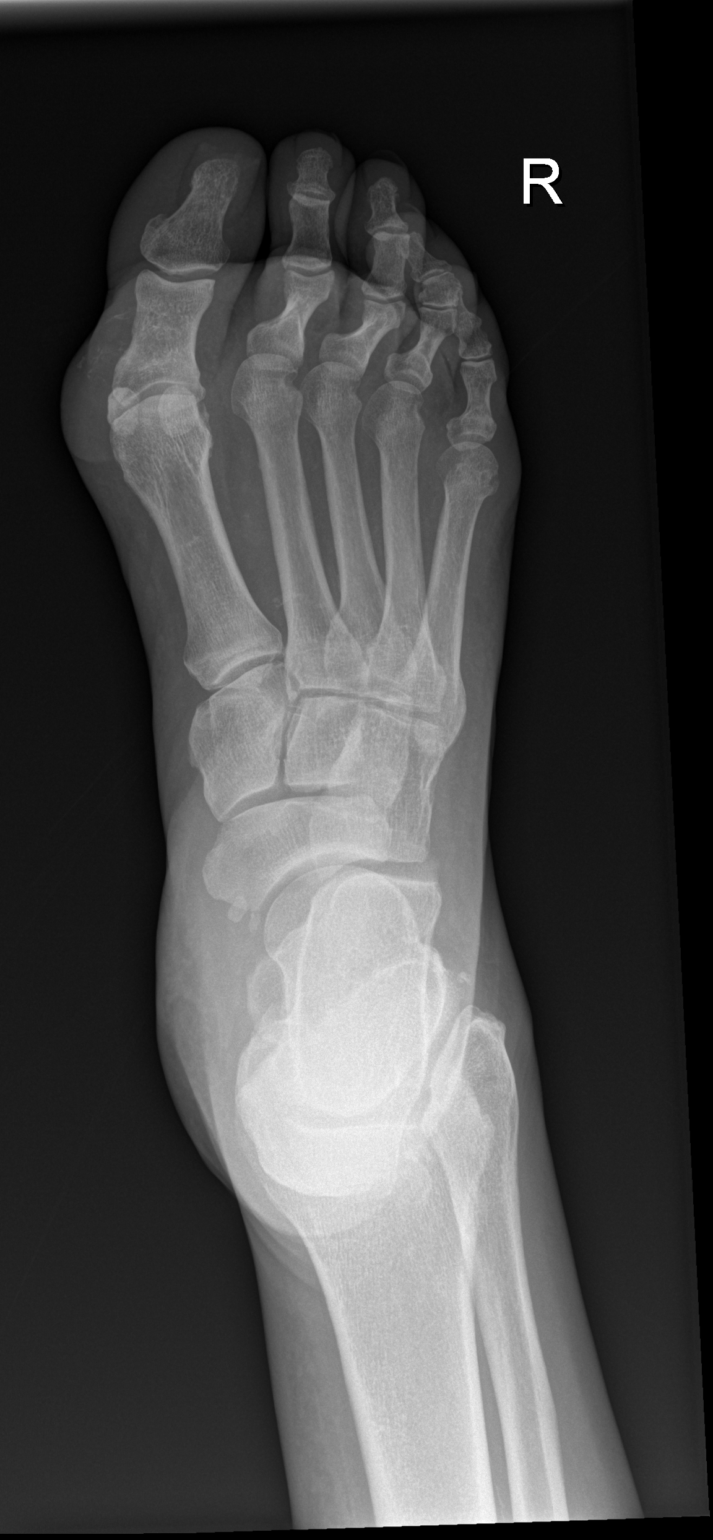

[x foot obl right]
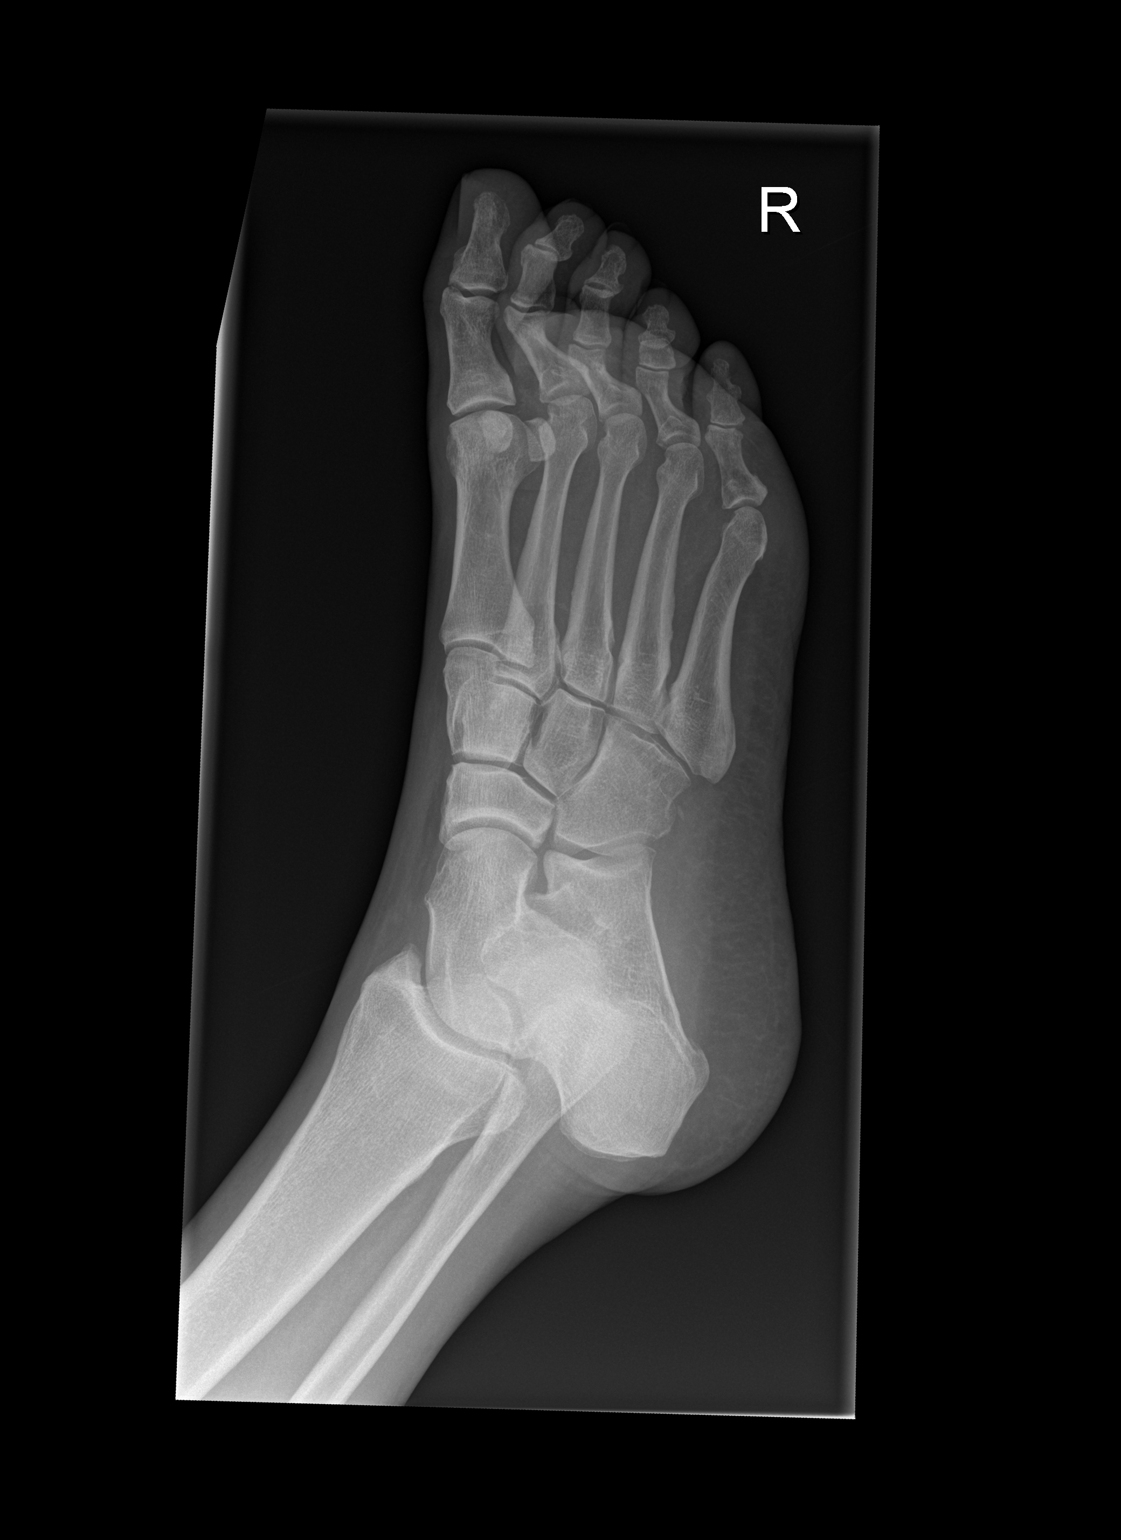

[x foot lat right]
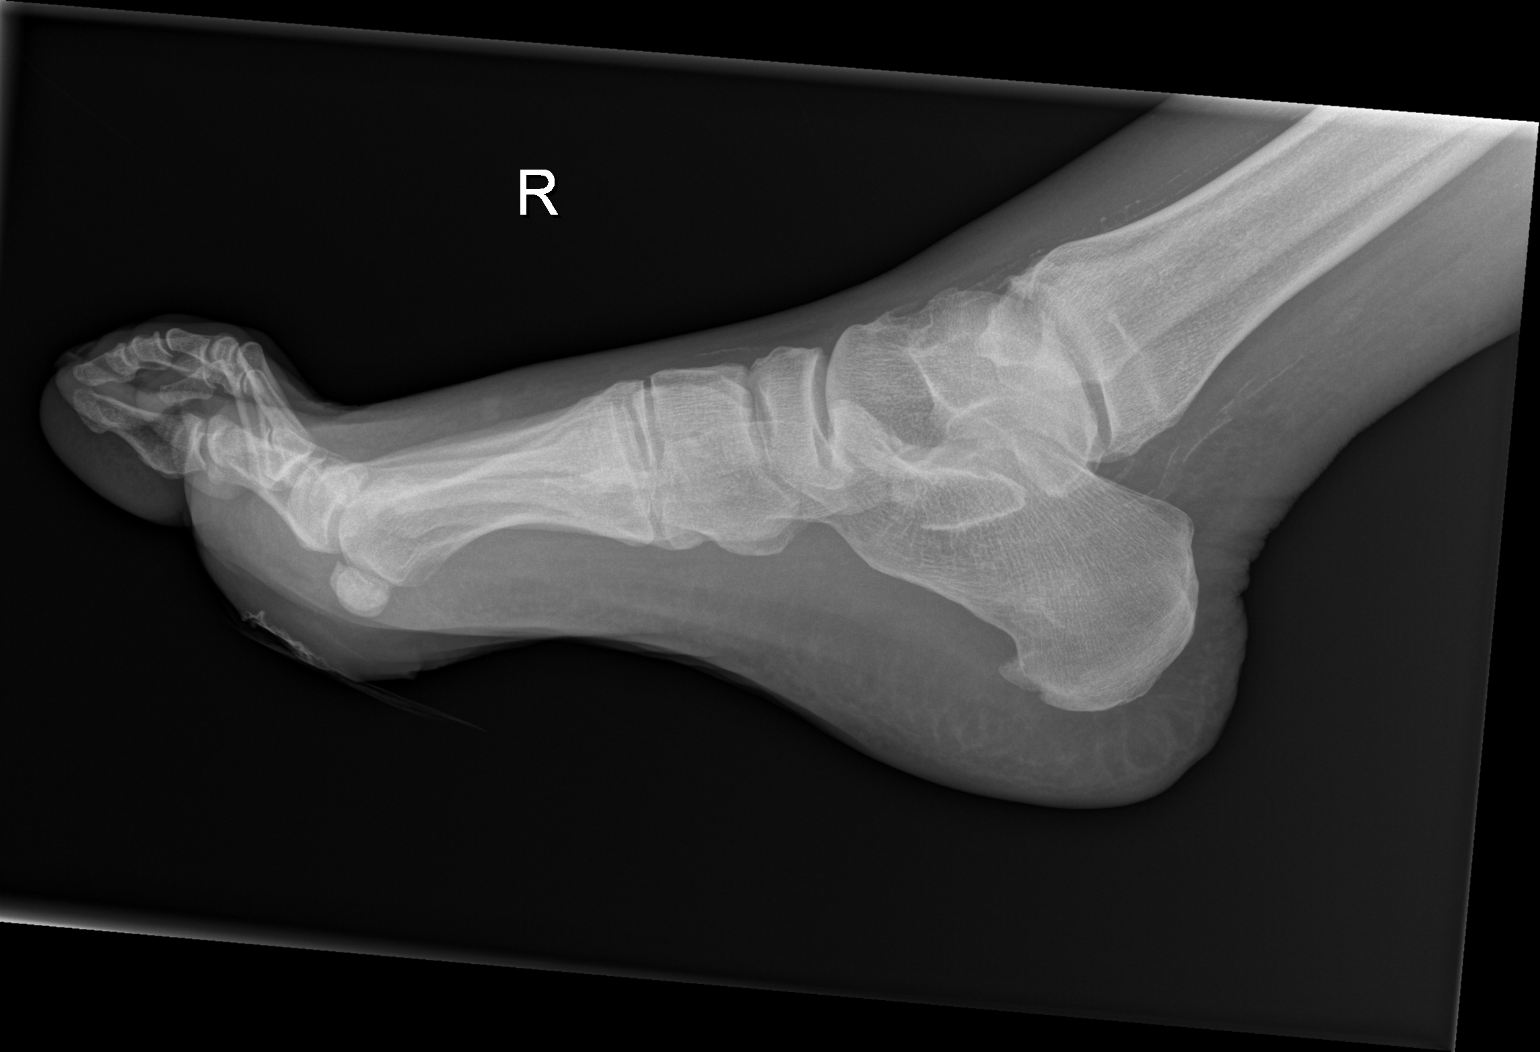

[3 of 3 positions shown; findings below may reference images not displayed]

FINDINGS: Frontal, oblique, lateral views of the right foot are obtained.
There is progressive soft tissue swelling plantar aspect first digit
at the level of the metatarsophalangeal joint. Soft tissue
ulceration is again noted. There is no underlying bony destruction
or periosteal reaction to suggest osteomyelitis.

No fracture, subluxation, or dislocation. Joint spaces are well
preserved.
IMPRESSION: 1. Increasing soft tissue swelling at the site of right first digit
soft tissue ulceration.
2. No evidence of osteomyelitis.

## 2021-03-11 NOTE — Progress Notes (Signed)
Chad Maddox Date of Birth: [birth year] Medical Record #[id number]  History of Present Illness: Mr. Bozman is seen for follow up CAD. He has a history of CAD s/p NSTEMI in 2009 with BMS of the RCA. In 2010 he had stenting of the OM2 with a DES.   He was admitted at the end of May 2021 with sepsis due to a nonhealing ulcer on his right foot with cellulitis but no osteomyelitis. Managed with antibiotics. Echo was unremarkable. Arterial doppler studies were normal.  On follow up today he reports his foot has healed completely. He has been staying with his sister in [place] - she has a diabetic foot as well. Yesterday he was working on his trailer in the hot sun when he suddenly felt very weak and washed out. Thought he would fall out. Went inside and cooled off and felt better after a while. No chest pain or SOB. His primary care Dr Yvonne Kendall has retired and he doesn't have a new primary care yet. Is planning to have shoulder surgery with Dr August Saucer. Reports sugars have been less than 180. Is on an insulin pen now. States he has missed taking his medication at times.   Current Outpatient Medications on File Prior to Visit  Medication Sig Dispense Refill   glipiZIDE (GLUCOTROL XL) 10 MG 24 hr tablet Take 10 mg by mouth daily.       lisinopril (PRINIVIL,ZESTRIL) 20 MG tablet Take 1 tablet (20 mg total) by mouth daily. 90 tablet 3   metFORMIN (GLUCOPHAGE-XR) 500 MG 24 hr tablet Take 1,000 mg by mouth 2 (two) times daily.       nitroGLYCERIN (NITROSTAT) 0.4 MG SL tablet Place 1 tablet (0.4 mg total) under the tongue every 5 (five) minutes as needed. (Patient taking differently: Place 0.4 mg under the tongue every 5 (five) minutes as needed for chest pain.) 25 tablet 11   No current facility-administered medications on file prior to visit.    Allergies  Allergen Reactions   Shellfish Allergy Shortness Of Breath and Swelling   Penicillins Other (See Comments)    Occurred as an infant;  reaction unknown.    Past Medical History:  Diagnosis Date   Coronary artery disease    s/p NSTEMI 01/2008  -- s/p BMS to RCA 01/2008  --  s/p XIENCE DES TO OM2 12/2008  --  OTW NONOBS DZS AND PATENT RCA STENT, EF 60%    Diabetes mellitus    GERD (gastroesophageal reflux disease)    Hyperlipidemia, mixed    Hypertension    Obesity     Past Surgical History:  Procedure Laterality Date   CARDIAC CATHETERIZATION  12/22/2008    Triple-vessel coronary artery disease -- Patent stent in the proximal right coronary artery -- Severe stenosis of the second obtuse marginal branch -- Successful percutaneous coronary intervention with placement of a Xience drug-eluting stent in the second obtuse marginal branch.  -- Normal left ventricular systolic function    Social History   Tobacco Use  Smoking Status Never  Smokeless Tobacco Never    Social History   Substance and Sexual Activity  Alcohol Use No    Family History  Problem Relation Age of Onset   Cerebral aneurysm Mother    Diabetes Mother    COPD Father    Bone cancer Father    Lung cancer Father    Lymphoma Sister     Review of Systems: The review of systems is positive for chronic right hip  pain.  All other systems were reviewed and are negative.  Physical Exam: BP 132/82   Pulse 68   Resp 20   Ht 5\' 10"  (1.778 m)   Wt 248 lb (112.5 kg)   SpO2 99%   BMI 35.58 kg/m  GENERAL:  Well appearing, obese WM in NAD HEENT:  PERRL, EOMI, sclera are clear. Oropharynx is clear. NECK:  No jugular venous distention, carotid upstroke brisk and symmetric, no bruits, no thyromegaly or adenopathy LUNGS:  Clear to auscultation bilaterally CHEST:  Unremarkable HEART:  RRR,  PMI not displaced or sustained,S1 and S2 within normal limits, no S3, no S4: no clicks, no rubs, no murmurs ABD:  Soft, nontender. BS +, no masses or bruits. No hepatomegaly, no splenomegaly EXT:  2 + pulses throughout, no edema, no cyanosis no clubbing. Tremor  right arm NEURO:  Alert and oriented x 3. Cranial nerves II through XII intact. PSYCH:  Cognitively intact    LABORATORY DATA:  Lab Results  Component Value Date   WBC 9.1 12/18/2019   HGB 14.0 12/18/2019   HCT 43.4 12/18/2019   PLT 167 12/18/2019   GLUCOSE 290 (H) 12/18/2019   CHOL  12/23/2008    158        ATP III CLASSIFICATION:  <200     mg/dL   Desirable  02/22/2009  mg/dL   Borderline High  165-790    mg/dL   High          TRIG >=383 (H) 12/23/2008   HDL 29 (L) 12/23/2008   LDLDIRECT 64.6 06/27/2008   LDLCALC  12/23/2008    76        Total Cholesterol/HDL:CHD Risk Coronary Heart Disease Risk Table                     Men   Women  1/2 Average Risk   3.4   3.3  Average Risk       5.0   4.4  2 X Average Risk   9.6   7.1  3 X Average Risk  23.4   11.0        Use the calculated Patient Ratio above and the CHD Risk Table to determine the patient's CHD Risk.        ATP III CLASSIFICATION (LDL):  <100     mg/dL   Optimal  02/22/2009  mg/dL   Near or Above                    Optimal  130-159  mg/dL   Borderline  329-191  mg/dL   High  660-600     mg/dL   Very High   ALT 22 >459   AST 19 12/15/2019   NA 134 (L) 12/18/2019   K 4.2 12/18/2019   CL 106 12/18/2019   CREATININE 0.80 12/18/2019   BUN 17 12/18/2019   CO2 21 (L) 12/18/2019   TSH 0.688 12/16/2019   INR 1.0 12/16/2019   HGBA1C 12.2 (H) 02/24/2020   Ecg today shows junctional rhythm with rate 68 and underlying Afib. Nonspecific ST abnormality. I have personally reviewed and interpreted this study.    LE arterial dopplers 12/17/19:Summary:  Right: Resting right ankle-brachial index is within normal range. No  evidence of significant right lower extremity arterial disease. The right  toe-brachial index is normal.   Left: Resting left ankle-brachial index is within normal range. No  evidence of significant left lower extremity arterial disease. The left  toe-brachial index is normal.       *See table(s)  above for measurements and observations.       Electronically signed by Coral Else MD on 12/19/2019 at 1:14:15 PM.    Echo 12/17/19: IMPRESSIONS     1. Left ventricular ejection fraction, by estimation, is 55 to 60%. The  left ventricle has normal function. The left ventricle has no regional  wall motion abnormalities. There is mild left ventricular hypertrophy.  Left ventricular diastolic parameters  were normal.   2. Right ventricular systolic function is normal. The right ventricular  size is normal. There is normal pulmonary artery systolic pressure. The  estimated right ventricular systolic pressure is 26.4 mmHg.   3. Left atrial size was mildly dilated.   4. The mitral valve is abnormal. Trivial mitral valve regurgitation.   5. The aortic valve is tricuspid. Aortic valve regurgitation is not  visualized.   6. The inferior vena cava is normal in size with greater than 50%  respiratory variability, suggesting right atrial pressure of 3 mmHg.    Assessment / Plan: 1. CAD s/p BMS of RCA 2009, s/p DES OM2 2010. Asymptomatic. He is on ASA, beta blocker, and high dose statin. Stressed importance of medication compliance. Continue risk factor modification.   2. Afib new. Controlled rate with junctional rhythm. This may be the cause of his symptoms yesterday. Will check Echo and 2 week event monitor. Check labs today. Recommend anticoagulation given [place] vasc score of 2-3. To improve compliance will use Xarelto 20 mg daily. Stop ASA. Avoid NSAIDs. Continue metoprolol for rate control.   3. DM type 2-per primary care. On insuline. Reports last A1c 9%. Will check today. Needs to get established with new PCP  4. Hyperlipidemia. Continue high dose Crestor. Will check labs today.  5. Nonhealing wound on ball of foot. Resolved.  6. HTN controlled  7. Shoulder surgery - will need to complete evaluation of Afib before we can clear. He is having no angina.

## 2021-03-15 ENCOUNTER — Encounter: Payer: Self-pay | Admitting: *Deleted

## 2021-03-15 ENCOUNTER — Ambulatory Visit (INDEPENDENT_AMBULATORY_CARE_PROVIDER_SITE_OTHER): Payer: Medicare Other

## 2021-03-15 ENCOUNTER — Other Ambulatory Visit: Payer: Self-pay

## 2021-03-15 ENCOUNTER — Encounter: Payer: Self-pay | Admitting: Cardiology

## 2021-03-15 ENCOUNTER — Ambulatory Visit (INDEPENDENT_AMBULATORY_CARE_PROVIDER_SITE_OTHER): Payer: Medicare Other | Admitting: Cardiology

## 2021-03-15 VITALS — BP 132/82 | HR 68 | Resp 20 | Ht 70.0 in | Wt 248.0 lb

## 2021-03-15 DIAGNOSIS — E7800 Pure hypercholesterolemia, unspecified: Secondary | ICD-10-CM | POA: Diagnosis not present

## 2021-03-15 DIAGNOSIS — I10 Essential (primary) hypertension: Secondary | ICD-10-CM

## 2021-03-15 DIAGNOSIS — E1169 Type 2 diabetes mellitus with other specified complication: Secondary | ICD-10-CM

## 2021-03-15 DIAGNOSIS — I498 Other specified cardiac arrhythmias: Secondary | ICD-10-CM

## 2021-03-15 DIAGNOSIS — I4891 Unspecified atrial fibrillation: Secondary | ICD-10-CM

## 2021-03-15 DIAGNOSIS — I25118 Atherosclerotic heart disease of native coronary artery with other forms of angina pectoris: Secondary | ICD-10-CM

## 2021-03-15 DIAGNOSIS — E669 Obesity, unspecified: Secondary | ICD-10-CM

## 2021-03-15 LAB — CBC WITH DIFFERENTIAL/PLATELET
Basophils Absolute: 0.1 10*3/uL (ref 0.0–0.2)
Basos: 1 %
EOS (ABSOLUTE): 0.1 10*3/uL (ref 0.0–0.4)
Eos: 1 %
Hematocrit: 53.3 % — ABNORMAL HIGH (ref 37.5–51.0)
Hemoglobin: 16.8 g/dL (ref 13.0–17.7)
Immature Grans (Abs): 0.3 10*3/uL — ABNORMAL HIGH (ref 0.0–0.1)
Immature Granulocytes: 3 %
Lymphocytes Absolute: 2.2 10*3/uL (ref 0.7–3.1)
Lymphs: 22 %
MCH: 24.9 pg — ABNORMAL LOW (ref 26.6–33.0)
MCHC: 31.5 g/dL (ref 31.5–35.7)
MCV: 79 fL (ref 79–97)
Monocytes Absolute: 0.9 10*3/uL (ref 0.1–0.9)
Monocytes: 9 %
Neutrophils Absolute: 6.5 10*3/uL (ref 1.4–7.0)
Neutrophils: 64 %
Platelets: 230 10*3/uL (ref 150–450)
RBC: 6.75 x10E6/uL — ABNORMAL HIGH (ref 4.14–5.80)
RDW: 17.3 % — ABNORMAL HIGH (ref 11.6–15.4)
WBC: 10.2 10*3/uL (ref 3.4–10.8)

## 2021-03-15 LAB — LIPID PANEL
Chol/HDL Ratio: 6.2 ratio — ABNORMAL HIGH (ref 0.0–5.0)
Cholesterol, Total: 280 mg/dL — ABNORMAL HIGH (ref 100–199)
HDL: 45 mg/dL (ref >39)
LDL Chol Calc (NIH): 165 mg/dL — ABNORMAL HIGH (ref 0–99)
Triglycerides: 363 mg/dL — ABNORMAL HIGH (ref 0–149)
VLDL Cholesterol Cal: 70 mg/dL — ABNORMAL HIGH (ref 5–40)

## 2021-03-15 LAB — COMPREHENSIVE METABOLIC PANEL
ALT: 20 IU/L (ref 0–44)
AST: 14 IU/L (ref 0–40)
Albumin/Globulin Ratio: 2 (ref 1.2–2.2)
Albumin: 4.5 g/dL (ref 3.8–4.8)
Alkaline Phosphatase: 74 IU/L (ref 44–121)
BUN/Creatinine Ratio: 23 (ref 10–24)
BUN: 19 mg/dL (ref 8–27)
Bilirubin Total: 0.3 mg/dL (ref 0.0–1.2)
CO2: 23 mmol/L (ref 20–29)
Calcium: 10.1 mg/dL (ref 8.6–10.2)
Chloride: 94 mmol/L — ABNORMAL LOW (ref 96–106)
Creatinine, Ser: 0.81 mg/dL (ref 0.76–1.27)
Globulin, Total: 2.2 g/dL (ref 1.5–4.5)
Glucose: 256 mg/dL — ABNORMAL HIGH (ref 65–99)
Potassium: 5.8 mmol/L (ref 3.5–5.2)
Sodium: 134 mmol/L (ref 134–144)
Total Protein: 6.7 g/dL (ref 6.0–8.5)
eGFR: 98 mL/min/{1.73_m2} (ref >59)

## 2021-03-15 LAB — HEMOGLOBIN A1C
Est. average glucose Bld gHb Est-mCnc: 272 mg/dL
Hgb A1c MFr Bld: 11.1 % — ABNORMAL HIGH (ref 4.8–5.6)

## 2021-03-15 LAB — TSH: TSH: 1.83 u[IU]/mL (ref 0.450–4.500)

## 2021-03-15 MED ORDER — METOPROLOL TARTRATE 25 MG PO TABS: 25.0000 mg | ORAL_TABLET | Freq: Two times a day (BID) | ORAL | 3 refills | Status: DC

## 2021-03-15 MED ORDER — RIVAROXABAN 20 MG PO TABS
20.0000 mg | ORAL_TABLET | Freq: Every day | ORAL | 11 refills | Status: DC
Start: 2021-03-15 — End: 2021-03-15

## 2021-03-15 MED ORDER — ROSUVASTATIN CALCIUM 40 MG PO TABS: 40.0000 mg | ORAL_TABLET | Freq: Every day | ORAL | 3 refills | Status: AC

## 2021-03-15 MED ORDER — RIVAROXABAN 20 MG PO TABS: 20.0000 mg | ORAL_TABLET | Freq: Every day | ORAL | 3 refills | Status: DC

## 2021-03-15 NOTE — Progress Notes (Unsigned)
Patient enrolled for [employer] to mail a ZIO XT monitor to his address on file. 

## 2021-03-15 NOTE — Patient Instructions (Addendum)
Start Xarelto 20 mg daily. This is a blood thinner to reduce your risk of stroke.  Stop taking ASA.  We will schedule you for an Echocardiogram  We will check lab work today.  We will have you wear a monitor.  Follow up with PA after Echo

## 2021-03-15 NOTE — Addendum Note (Signed)
Addended by: Neoma Laming on: 03/15/2021 10:08 AM   Modules accepted: Orders

## 2021-03-15 NOTE — Addendum Note (Signed)
Addended by: Neoma Laming on: 03/15/2021 10:52 AM   Modules accepted: Orders

## 2021-03-16 ENCOUNTER — Other Ambulatory Visit: Payer: Self-pay

## 2021-03-16 DIAGNOSIS — E7800 Pure hypercholesterolemia, unspecified: Secondary | ICD-10-CM

## 2021-03-16 DIAGNOSIS — E1169 Type 2 diabetes mellitus with other specified complication: Secondary | ICD-10-CM

## 2021-03-16 DIAGNOSIS — R7309 Other abnormal glucose: Secondary | ICD-10-CM

## 2021-03-16 DIAGNOSIS — E875 Hyperkalemia: Secondary | ICD-10-CM

## 2021-03-16 DIAGNOSIS — I25118 Atherosclerotic heart disease of native coronary artery with other forms of angina pectoris: Secondary | ICD-10-CM

## 2021-03-16 NOTE — Progress Notes (Signed)
bmet  

## 2021-03-17 ENCOUNTER — Telehealth: Payer: Self-pay | Admitting: Physician Assistant

## 2021-03-17 NOTE — Telephone Encounter (Signed)
Patient did not make for blood work yesterday at 4pm and then instructed to come next day. Today is Saturday and he is trying to get in for blood work.   Instructed to come on Monday for blood work.

## 2021-03-21 ENCOUNTER — Other Ambulatory Visit: Payer: Self-pay

## 2021-03-21 DIAGNOSIS — E875 Hyperkalemia: Secondary | ICD-10-CM

## 2021-03-21 DIAGNOSIS — I4891 Unspecified atrial fibrillation: Secondary | ICD-10-CM

## 2021-03-21 LAB — BASIC METABOLIC PANEL
BUN/Creatinine Ratio: 27 — ABNORMAL HIGH (ref 10–24)
BUN: 22 mg/dL (ref 8–27)
CO2: 20 mmol/L (ref 20–29)
Calcium: 9.8 mg/dL (ref 8.6–10.2)
Chloride: 100 mmol/L (ref 96–106)
Creatinine, Ser: 0.83 mg/dL (ref 0.76–1.27)
Glucose: 206 mg/dL — ABNORMAL HIGH (ref 65–99)
Potassium: 5.3 mmol/L — ABNORMAL HIGH (ref 3.5–5.2)
Sodium: 136 mmol/L (ref 134–144)
eGFR: 98 mL/min/{1.73_m2} (ref >59)

## 2021-03-21 MED ORDER — AMLODIPINE BESYLATE 2.5 MG PO TABS: 2.5000 mg | ORAL_TABLET | Freq: Every day | ORAL | 3 refills | Status: DC

## 2021-03-28 ENCOUNTER — Ambulatory Visit: Payer: Medicare Other | Admitting: Orthopedic Surgery

## 2021-03-29 ENCOUNTER — Ambulatory Visit: Payer: Medicare Other | Admitting: General Practice

## 2021-04-02 ENCOUNTER — Ambulatory Visit (HOSPITAL_COMMUNITY): Payer: Medicare Other | Attending: Cardiology

## 2021-04-02 ENCOUNTER — Other Ambulatory Visit: Payer: Self-pay

## 2021-04-02 DIAGNOSIS — E7800 Pure hypercholesterolemia, unspecified: Secondary | ICD-10-CM | POA: Insufficient documentation

## 2021-04-02 DIAGNOSIS — I10 Essential (primary) hypertension: Secondary | ICD-10-CM | POA: Diagnosis not present

## 2021-04-02 DIAGNOSIS — E1169 Type 2 diabetes mellitus with other specified complication: Secondary | ICD-10-CM | POA: Diagnosis present

## 2021-04-02 DIAGNOSIS — I498 Other specified cardiac arrhythmias: Secondary | ICD-10-CM | POA: Insufficient documentation

## 2021-04-02 DIAGNOSIS — I4891 Unspecified atrial fibrillation: Secondary | ICD-10-CM | POA: Diagnosis present

## 2021-04-02 DIAGNOSIS — I25118 Atherosclerotic heart disease of native coronary artery with other forms of angina pectoris: Secondary | ICD-10-CM | POA: Insufficient documentation

## 2021-04-02 DIAGNOSIS — E669 Obesity, unspecified: Secondary | ICD-10-CM | POA: Diagnosis present

## 2021-04-02 LAB — ECHOCARDIOGRAM COMPLETE
Area-P 1/2: 3.88 cm2
S' Lateral: 3.4 cm

## 2021-04-10 ENCOUNTER — Telehealth: Payer: Self-pay

## 2021-04-10 NOTE — Telephone Encounter (Signed)
Records( Dr.Jordan's 03/15/21 office note,recent labs,demographics )faxed to Dr.Balan at fax # [phone] for appointment elevated A1C.

## 2021-04-11 ENCOUNTER — Ambulatory Visit: Payer: Medicare Other | Admitting: Orthopedic Surgery

## 2021-04-11 ENCOUNTER — Ambulatory Visit: Payer: Self-pay

## 2021-04-11 ENCOUNTER — Other Ambulatory Visit: Payer: Self-pay

## 2021-04-11 DIAGNOSIS — S6992XA Unspecified injury of left wrist, hand and finger(s), initial encounter: Secondary | ICD-10-CM | POA: Diagnosis not present

## 2021-04-11 LAB — BASIC METABOLIC PANEL
BUN/Creatinine Ratio: 23 (ref 10–24)
BUN: 17 mg/dL (ref 8–27)
CO2: 21 mmol/L (ref 20–29)
Calcium: 10 mg/dL (ref 8.6–10.2)
Chloride: 96 mmol/L (ref 96–106)
Creatinine, Ser: 0.74 mg/dL — ABNORMAL LOW (ref 0.76–1.27)
Glucose: 238 mg/dL — ABNORMAL HIGH (ref 65–99)
Potassium: 4.8 mmol/L (ref 3.5–5.2)
Sodium: 136 mmol/L (ref 134–144)
eGFR: 101 mL/min/{1.73_m2} (ref >59)

## 2021-04-11 NOTE — Progress Notes (Signed)
Office Visit Note   Patient: Chad Maddox           Date of Birth: 12-Jan-1956           MRN: [id number] Visit Date: 04/11/2021 Requested by: No referring provider defined for this encounter. PCP: Pcp, No  Subjective: Chief Complaint  Patient presents with   Left Shoulder - Pain    Discuss shoulder arthroplasty   Right Shoulder - Pain    Discuss shoulder arthroplasty    HPI: Chad Maddox is a 65 y.o. male who presents to the office complaining of bilateral shoulder pain.  Patient has a history of bilateral shoulder rotator cuff arthropathy.  He has had years of symptoms and the left shoulder is bothering him significantly more than the right.  He wakes with pain at night but he denies taking any pain medication or using alcohol to deal with this pain.  He complains of lateral shoulder pain primarily in the left shoulder that radiates down to his bicep.  He has seen his cardiologist recently (Dr. [place]) who recommended he see someone for management of his diabetes and he has been referred to what sounds like endocrinology with an appointment in early October.  His most recent A1c was 11.1 on 03/15/2021.  He reports he is taking Xarelto.  No new injury to the left shoulder.  Left-hand-dominant.  Patient also reports left wrist pain since he was kicked in the hand by a horse on Saturday.  He notes he had a lot of swelling into his index and middle fingers but this has improved to some degree.  He has not really been able to use his left hand since the injury.  He has an abrasion which she is covering with a Band-Aid..                ROS: All systems reviewed are negative as they relate to the chief complaint within the history of present illness.  Patient denies fevers or chills.  Assessment & Plan: Visit Diagnoses:  1. Left wrist injury, initial encounter     Plan: Patient is a 65 year old male who presents for evaluation of bilateral shoulder pain and left hand/wrist pain.   He has history of bilateral shoulder rotator cuff arthropathy and is requesting surgery.  He previously had good glycemic control by his history earlier this year but after his primary care physician retired, his A1c has risen to 11.1 which is untenable for shoulder replacement.  He has been referred to an endocrinologist for management of his diabetes according to him today.  Plan for consideration of shoulder placement in the future after his A1c is below 8 consistently.  Additionally, he reports that he was kicked in the left hand/wrist by a horse last Saturday.  He had significant swelling extending into his fingers and has been finding it difficult to use his left hand and move his wrist due to the pain and swelling.  He has pain that he localizes primarily to the distal second metacarpal as well as the dorsal aspect of the radial wrist.  He has generalized tenderness throughout both these areas but more so over the radial styloid and anatomic snuffbox.  Plan for immobilization with left wrist brace.  Refer patient to see Dr. Frazier Butt within the next week for further evaluation of left wrist pain and swelling as well as abrasion on the dorsum of the hand.  Radiographs are negative for any identifiable fracture today but concern for occult  fracture given the significance of the injury.  Follow-Up Instructions: No follow-ups on file.   Orders:  Orders Placed This Encounter  Procedures   XR Wrist Complete Left   No orders of the defined types were placed in this encounter.     Procedures: No procedures performed   Clinical Data: No additional findings.  Objective: Vital Signs: There were no vitals taken for this visit.  Physical Exam:  Constitutional: Patient appears well-developed HEENT:  Head: Normocephalic Eyes:EOM are normal Neck: Normal range of motion Cardiovascular: Normal rate Pulmonary/chest: Effort normal Neurologic: Patient is alert Skin: Skin is warm Psychiatric:  Patient has normal mood and affect  Ortho Exam: Ortho exam demonstrates left shoulder with 50 degrees external rotation, 90 degrees abduction, 120 degrees forward flexion.  Axillary nerve intact with deltoid firing.  1+ radial pulse of the left upper extremity.  There is a prominent mole in the anterior lateral aspect of the left shoulder but patient reports this is unchanged in 5 years.  Abrasion noted over the dorsum of the left hand and wrist with no drainage actively or expressible drainage.  There is significant erythema throughout the dorsum of the hand extending to the wrist into the second and third MCP joints.  Specialty Comments:  No specialty comments available.  Imaging: No results found.   PMFS History: Patient Active Problem List   Diagnosis Date Noted   Sepsis (HCC) 12/15/2019   Cellulitis of right foot 12/15/2019   Atrial fibrillation with RVR (HCC) 12/15/2019   Hyperlipidemia 04/19/2016   HYPERTENSION, BENIGN 07/12/2010   CAD, NATIVE VESSEL 06/23/2009   Diabetes mellitus type 2 in obese (HCC) 10/28/2008   OBESITY 10/28/2008   LEUKOCYTOSIS 10/28/2008   GERD 10/28/2008   Past Medical History:  Diagnosis Date   Coronary artery disease    s/p NSTEMI 01/2008  -- s/p BMS to RCA 01/2008  --  s/p XIENCE DES TO OM2 12/2008  --  OTW NONOBS DZS AND PATENT RCA STENT, EF 60%    Diabetes mellitus    GERD (gastroesophageal reflux disease)    Hyperlipidemia, mixed    Hypertension    Obesity     Family History  Problem Relation Age of Onset   Cerebral aneurysm Mother    Diabetes Mother    COPD Father    Bone cancer Father    Lung cancer Father    Lymphoma Sister     Past Surgical History:  Procedure Laterality Date   CARDIAC CATHETERIZATION  12/22/2008    Triple-vessel coronary artery disease -- Patent stent in the proximal right coronary artery -- Severe stenosis of the second obtuse marginal branch -- Successful percutaneous coronary intervention with placement of a  Xience drug-eluting stent in the second obtuse marginal branch.  -- Normal left ventricular systolic function   Social History   Occupational History   Not on file  Tobacco Use   Smoking status: Never   Smokeless tobacco: Never  Substance and Sexual Activity   Alcohol use: No   Drug use: No   Sexual activity: Not on file

## 2021-04-12 ENCOUNTER — Encounter: Payer: Self-pay | Admitting: Orthopedic Surgery

## 2021-04-12 ENCOUNTER — Telehealth: Payer: Self-pay

## 2021-04-12 NOTE — Telephone Encounter (Signed)
Patient was seen by Dr Dean/Luke on Wednesday morning. The want him to see Dr Frazier Butt to eval left wrist within the next week. Please call patient to schedule appt. Thanks.

## 2021-04-14 NOTE — Progress Notes (Signed)
Cardiology Clinic Note   Patient Name: Chad Maddox Date of Encounter: 04/16/2021  Primary Care Provider:  Pcp, No Primary Cardiologist:  Peter Swaziland, MD  Patient Profile    Chad Maddox 65 year old male presents the clinic today for follow-up evaluation of his atrial fibrillation.  Past Medical History    Past Medical History:  Diagnosis Date   Coronary artery disease    s/p NSTEMI 01/2008  -- s/p BMS to RCA 01/2008  --  s/p XIENCE DES TO OM2 12/2008  --  OTW NONOBS DZS AND PATENT RCA STENT, EF 60%    Diabetes mellitus    GERD (gastroesophageal reflux disease)    Hyperlipidemia, mixed    Hypertension    Obesity    Past Surgical History:  Procedure Laterality Date   CARDIAC CATHETERIZATION  12/22/2008    Triple-vessel coronary artery disease -- Patent stent in the proximal right coronary artery -- Severe stenosis of the second obtuse marginal branch -- Successful percutaneous coronary intervention with placement of a Xience drug-eluting stent in the second obtuse marginal branch.  -- Normal left ventricular systolic function    Allergies  Allergies  Allergen Reactions   Shellfish Allergy Shortness Of Breath and Swelling   Penicillins Other (See Comments)    Occurred as an infant; reaction unknown.    History of Present Illness    Chad Maddox has a PMH of coronary artery disease status post NSTEMI in 2009 with bare-metal stent to his RCA.  In 2010 he underwent cardiac catheterization received DES to his OM 2.  He was admitted 5/21 with sepsis due to nonhealing ulcer of his right foot and cellulitis.  He was managed with antibiotics.  His echocardiogram during that time was unremarkable.  He had an arterial Doppler that was normal.  He was seen in follow-up by Dr. Swaziland on 03/15/2021.  His foot had healed well.  He had been staying with his sister in [place].  He reported that she had diabetic foot trouble as well.  He had been working on his trailer  the previous day in the hot sun.  He reported having felt very weak and washed out.  He felt he may faint.  He went inside, cooled off and felt better.  He denies chest pain shortness of breath.  His primary care physician has retired.  He reported that he had not yet established care with a new PCP.  He was planning a shoulder surgery with Dr. August Saucer.  He was watching his blood sugars more closely and reported that they had been less than 180.  He had been prescribed an insulin pen.  He did indicate that he would occasionally misses medications.  His EKG showed junctional rhythm with a rate of 68 and underlying atrial fibrillation.  Nonspecific ST abnormality.  He presents the clinic today for follow-up evaluation states he had an accident while he was working with trees.  He has injured his left wrist and is seen orthopedics.  Currently it is immobilized.  He also had a fall which caused him to knock out one of his front teeth.  We reviewed his echocardiogram and elevated pulmonary pressures.  I explained that he would benefit from a sleep study.  He expressed understanding.  We also reviewed his cholesterol panel and I will prescribe ezetimibe.  We will repeat his fasting lipids and LFTs in 8 weeks.  I will plan his follow-up for after his sleep study.  Today he denies chest  pain, shortness of breath, lower extremity edema, fatigue, palpitations, melena, hematuria, hemoptysis, diaphoresis, weakness, presyncope, syncope, orthopnea, and PND.   Home Medications    Prior to Admission medications   Medication Sig Start Date End Date Taking? Authorizing Provider  amLODipine (NORVASC) 2.5 MG tablet Take 1 tablet (2.5 mg total) by mouth daily. 03/21/21 06/19/21  Swaziland, Peter M, MD  empagliflozin (JARDIANCE) 25 MG TABS tablet Take 25 mg daily 03/15/21   Sw[place]iland, Peter M, MD  glipiZIDE (GLUCOTROL XL) 10 MG 24 hr tablet Take 10 mg by mouth daily.      [provider]  insulin glargine, 1 Unit Dial,  (TOUJEO SOLOSTAR) 300 UNIT/ML Solostar Pen Take 30 uts  every morning 03/15/21   Swaziland, Peter M, MD  metFORMIN (GLUCOPHAGE-XR) 500 MG 24 hr tablet Take 1,000 mg by mouth 2 (two) times daily.      [provider]  metoprolol tartrate (LOPRESSOR) 25 MG tablet Take 1 tablet (25 mg total) by mouth 2 (two) times daily. 03/15/21   Swaziland, Peter M, MD  nitroGLYCERIN (NITROSTAT) 0.4 MG SL tablet Place 1 tablet (0.4 mg total) under the tongue every 5 (five) minutes as needed. Patient taking differently: Place 0.4 mg under the tongue every 5 (five) minutes as needed for chest pain. 04/19/16   Sw[place]iland, Peter M, MD  rivaroxaban (XARELTO) 20 MG TABS tablet Take 1 tablet (20 mg total) by mouth daily with supper. 03/15/21   [place]il[place], Peter M, MD  rosuvastatin (CRESTOR) 40 MG tablet Take 1 tablet (40 mg total) by mouth daily. 03/15/21   Swaziland, Peter M, MD    Family History    Family History  Problem Relation Age of Onset   Cerebral aneurysm Mother    Diabetes Mother    COPD Father    Bone cancer Father    Lung cancer Father    Lymphoma Sister    He indicated that his mother is deceased. He indicated that his father is deceased. He indicated that the status of his sister is unknown.  Social History    Social History   Socioeconomic History   Marital status: Married    Spouse name: Not on file   Number of children: Not on file   Years of education: Not on file   Highest education level: Not on file  Occupational History   Not on file  Tobacco Use   Smoking status: Never   Smokeless tobacco: Never  Substance and Sexual Activity   Alcohol use: No   Drug use: No   Sexual activity: Not on file  Other Topics Concern   Not on file  Social History Narrative   Not on file   Social Determinants of Health   Financial Resource Strain: Not on file  Food Insecurity: Not on file  Transportation Needs: Not on file  Physical Activity: Not on file  Stress: Not on file  Social Connections: Not on  file  Intimate Partner Violence: Not on file     Review of Systems    General:  No chills, fever, night sweats or weight changes.  Cardiovascular:  No chest pain, dyspnea on exertion, edema, orthopnea, palpitations, paroxysmal nocturnal dyspnea. Dermatological: No rash, lesions/masses Respiratory: No cough, dyspnea Urologic: No hematuria, dysuria Abdominal:   No nausea, vomiting, diarrhea, bright red blood per rectum, melena, or hematemesis Neurologic:  No visual changes, wkns, changes in mental status. All other systems reviewed and are otherwise negative except as noted above.  Physical Exam  VS:  BP 138/82   Pulse 66   Wt 254 lb 3.2 oz (115.3 kg)   SpO2 97%   BMI 36.47 kg/m  , BMI Body mass index is 36.47 kg/m. GEN: Well nourished, well developed, in no acute distress. HEENT: normal. Neck: Supple, no JVD, carotid bruits, or masses. Cardiac: RRR, no murmurs, rubs, or gallops. No clubbing, cyanosis, generalized nonpitting bilateral lower extremity edema.  Radials/DP/PT 2+ and equal bilaterally.  Respiratory:  Respirations regular and unlabored, clear to auscultation bilaterally. GI: Soft, nontender, nondistended, BS + x 4. MS: no deformity or atrophy. Skin: warm and dry, no rash. Neuro:  Strength and sensation are intact. Psych: Normal affect.  Accessory Clinical Findings    Recent Labs: 03/15/2021: ALT 20; Hemoglobin 16.8; Platelets 230; TSH 1.830 04/10/2021: BUN 17; Creatinine, Ser 0.74; Potassium 4.8; Sodium 136   Recent Lipid Panel    Component Value Date/Time   CHOL 280 (H) 03/15/2021 0858   TRIG 363 (H) 03/15/2021 0858   HDL 45 03/15/2021 0858   CHOLHDL 6.2 (H) 03/15/2021 0858   CHOLHDL 5.4 12/23/2008 0604   VLDL 53 (H) 12/23/2008 0604   LDLCALC 165 (H) 03/15/2021 0858   LDLDIRECT 64.6 06/27/2008 0925    ECG personally reviewed by me today- none today.  Echocardiogram 04/02/2021  IMPRESSIONS     1. Left ventricular ejection fraction, by estimation,  is 55 to 60%. The  left ventricle has normal function. The left ventricle has no regional  wall motion abnormalities. Left ventricular diastolic parameters are  indeterminate.   2. Right ventricular systolic function is mildly reduced. The right  ventricular size is mildly enlarged. There is moderately elevated  pulmonary artery systolic pressure. The estimated right ventricular  systolic pressure is 48.4 mmHg.   3. Left atrial size was mildly dilated.   4. Right atrial size was mildly dilated.   5. The mitral valve is normal in structure. Trivial mitral valve  regurgitation. No evidence of mitral stenosis.   6. The aortic valve is tricuspid. Aortic valve regurgitation is not  visualized. No aortic stenosis is present.   7. The inferior vena cava is dilated in size with >50% respiratory  variability, suggesting right atrial pressure of 8 mmHg.   8. The patient was in atrial fibrillation.   Cardiac event monitor 04/10/2021  Atrial fibrillation with controlled response - average HR 62 bpm with range 33-113 bpm 6 pauses up to 3.7 seconds 4 brief runs NSVT     Patch Wear Time:  13 days and 6 hours (2022-08-31T12:19:53-0400 to 2022-09-13T19:11:45-0400)   4 Ventricular Tachycardia runs occurred, the run with the fastest interval lasting 4 beats with a max rate of 184 bpm, the longest lasting 4 beats with an avg rate of 126 bpm. Atrial Fibrillation occurred continuously (100% burden), ranging from 33-113  bpm (avg of 62 bpm). 6 Pauses occurred, the longest lasting 3.7 secs (16 bpm). Isolated VEs were rare (<1.0%, 1628), VE Couplets were rare (<1.0%, 45), and VE Triplets were rare (<1.0%, 3). Ventricular Bigeminy was present.   Assessment & Plan   1.  Atrial fibrillation-heart rate today 66.  Cardiac event monitor showed atrial fibrillation with controlled rate 62 bpm and a range of 33-113.  He was also noted to have 6 pauses up to 3.7 seconds.  Metoprolol was reduced at that time.  Denies  presyncope or syncope, lightheadedness and dizziness. Continue metoprolol Heart healthy low-sodium diet-salty 6 given Increase physical activity as tolerated  Elevated pulmonary artery systolic pressure-echocardiogram  showed an elevated pressure of 48.4 mmHg.  LVEF 55 to 60%, mildly dilated left and right atria, trivial mitral valve regurgitation. Order split-night sleep study  Coronary artery disease-denies recent episodes of arm neck back or chest discomfort.  Underwent cardiac catheterization in 2009 and received bare-metal stent to his RCA.  Underwent repeat cardiac catheterization in 2010 and received DES to OM2. Continue amlodipine, metoprolol, rosuvastatin, nitroglycerin Heart healthy low-sodium diet-salty 6 given Increase physical activity as tolerated  Hyperlipidemia-03/15/2021: Cholesterol, Total 280; HDL 45; LDL Chol Calc (NIH) 165; Triglycerides 363 Continue rosuvastatin Heart healthy low-sodium diet-salty 6 given Increase physical activity as tolerated Start ezetimibe 10 mg daily Repeat fasting lipids and LFTs in 8 weeks.  Essential hypertension-BP today 138/82.  Well-controlled at home.  120 132/70 4-84 Continue metoprolol, amlodipine Heart healthy low-sodium diet-salty 6 given Increase physical activity as tolerated   Disposition: Follow-up with Dr. Swaziland after sleep study.  Thomasene Ripple. Yahira Timberman NP-C    04/16/2021, 2:46 PM [facility] [address] Office [phone] Fax [phone]  Notice: This dictation was prepared with Dragon dictation along with smaller phrase technology. Any transcriptional errors that result from this process are unintentional and may not be corrected upon review.  I spent 14 minutes examining this patient, reviewing medications, and using patient centered shared decision making involving her cardiac care.  Prior to her visit I spent greater than 20 minutes reviewing her past medical history,   medications, and prior cardiac tests.

## 2021-04-16 ENCOUNTER — Encounter: Payer: Self-pay | Admitting: General Practice

## 2021-04-16 ENCOUNTER — Encounter: Payer: Self-pay | Admitting: Orthopedic Surgery

## 2021-04-16 ENCOUNTER — Ambulatory Visit: Payer: Medicare Other | Admitting: Orthopedic Surgery

## 2021-04-16 ENCOUNTER — Ambulatory Visit: Payer: Medicare Other | Admitting: General Practice

## 2021-04-16 ENCOUNTER — Other Ambulatory Visit: Payer: Self-pay

## 2021-04-16 VITALS — BP 138/82 | HR 66 | Wt 254.2 lb

## 2021-04-16 DIAGNOSIS — I272 Pulmonary hypertension, unspecified: Secondary | ICD-10-CM

## 2021-04-16 DIAGNOSIS — I25118 Atherosclerotic heart disease of native coronary artery with other forms of angina pectoris: Secondary | ICD-10-CM | POA: Diagnosis not present

## 2021-04-16 DIAGNOSIS — M25532 Pain in left wrist: Secondary | ICD-10-CM

## 2021-04-16 DIAGNOSIS — E7800 Pure hypercholesterolemia, unspecified: Secondary | ICD-10-CM | POA: Diagnosis not present

## 2021-04-16 DIAGNOSIS — Z79899 Other long term (current) drug therapy: Secondary | ICD-10-CM

## 2021-04-16 DIAGNOSIS — I4891 Unspecified atrial fibrillation: Secondary | ICD-10-CM

## 2021-04-16 DIAGNOSIS — I10 Essential (primary) hypertension: Secondary | ICD-10-CM | POA: Diagnosis not present

## 2021-04-16 MED ORDER — EZETIMIBE 10 MG PO TABS: 10.0000 mg | ORAL_TABLET | Freq: Every day | ORAL | 3 refills | Status: DC

## 2021-04-16 NOTE — Progress Notes (Signed)
Office Visit Note   Patient: Chad Maddox           Date of Birth: 20-Dec-1955           MRN: [id number] Visit Date: 04/16/2021              Requested by: No referring provider defined for this encounter. PCP: Pcp, No   Assessment & Plan: Visit Diagnoses:  1. Pain in left wrist     Plan: Discussed with patient that he doesn't have any obvious fracture on x-ray.  The pain over the second [facility] in the area of his wound seems to have improved.  At this point, we will continue to treat with immobilization.  Discussed using NSAIDs for pain control.  His other big problem seems to be left shoulder pain, however, he is unable to get a RSTA given his significantly elevated A1c.  If his wrist pain is unchanged in a few weeks then we can see him back to re-evaluate and consider advanced imaging.  Follow-Up Instructions: No follow-ups on file.   Orders:  No orders of the defined types were placed in this encounter.  No orders of the defined types were placed in this encounter.     Procedures: No procedures performed   Clinical Data: No additional findings.   Subjective: Chief Complaint  Patient presents with   Left Wrist - Injury, Pain    States log rolled and he tried to reach to catch it and it hurt his and gave him 3 wounds on his hand, Left Hand Dom,    This is a 65 yo LHD M who presents with L shoulder and L wrist pain.  He notes that he was chopping wood approximately 12 days ago when a log rolled away and he reached out to grab it with his left hand.  He described pain at dorsoradial wrist since the injury.  He was seen by Karenann Cai on 9/21 who put him in a wrist brace.  His pain is more severe with touch but also worse w/ ROM of the wrist.  He had a seemingly superficial wound over the dorsal index MC which appears to be healing well.  His other issue is his L shoulder pain which keeps him up at night.  He needs a RSTA but is having difficulty with his blood sugar.    Injury   Review of Systems  Constitutional: Negative.   Respiratory: Negative.    Cardiovascular: Negative.   Skin: Negative.   Neurological: Negative.     Objective: Vital Signs: There were no vitals taken for this visit.  Physical Exam Constitutional:      Appearance: Normal appearance.  Cardiovascular:     Rate and Rhythm: Normal rate.     Pulses: Normal pulses.  Pulmonary:     Effort: Pulmonary effort is normal.  Skin:    General: Skin is warm and dry.     Capillary Refill: Capillary refill takes less than 2 seconds.  Neurological:     Mental Status: He is alert.    Left Hand Exam   Tenderness  Left hand tenderness location: TTP at dorsoradial wrist.   Muscle Strength  Wrist extension: 4/5  Wrist flexion: 4/5   Other  Erythema: absent Sensation: normal Pulse: present  Comments:  Superficial wound over dorsal aspect of index MC, some discomfort with PROM of wrist but pain worse w/ palpation of dorsoradial wrist     Specialty Comments:  No specialty comments  available.  Imaging: X-rays from 9/21 with no acute bony injury or instability   PMFS History: Patient Active Problem List   Diagnosis Date Noted   Pain in left wrist 04/16/2021   Sepsis (HCC) 12/15/2019   Cellulitis of right foot 12/15/2019   Atrial fibrillation with RVR (HCC) 12/15/2019   Hyperlipidemia 04/19/2016   HYPERTENSION, BENIGN 07/12/2010   CAD, NATIVE VESSEL 06/23/2009   Diabetes mellitus type 2 in obese (HCC) 10/28/2008   OBESITY 10/28/2008   LEUKOCYTOSIS 10/28/2008   GERD 10/28/2008   Past Medical History:  Diagnosis Date   Coronary artery disease    s/p NSTEMI 01/2008  -- s/p BMS to RCA 01/2008  --  s/p XIENCE DES TO OM2 12/2008  --  OTW NONOBS DZS AND PATENT RCA STENT, EF 60%    Diabetes mellitus    GERD (gastroesophageal reflux disease)    Hyperlipidemia, mixed    Hypertension    Obesity     Family History  Problem Relation Age of Onset   Cerebral aneurysm  Mother    Diabetes Mother    COPD Father    Bone cancer Father    Lung cancer Father    Lymphoma Sister     Past Surgical History:  Procedure Laterality Date   CARDIAC CATHETERIZATION  12/22/2008    Triple-vessel coronary artery disease -- Patent stent in the proximal right coronary artery -- Severe stenosis of the second obtuse marginal branch -- Successful percutaneous coronary intervention with placement of a Xience drug-eluting stent in the second obtuse marginal branch.  -- Normal left ventricular systolic function   Social History   Occupational History   Not on file  Tobacco Use   Smoking status: Never   Smokeless tobacco: Never  Substance and Sexual Activity   Alcohol use: No   Drug use: No   Sexual activity: Not on file

## 2021-04-16 NOTE — Patient Instructions (Signed)
Medication Instructions:  The current medical regimen is effective;  continue present plan and medications as directed. Please refer to the Current Medication list given to you today.  *If you need a refill on your cardiac medications before your next appointment, please call your pharmacy*  Lab Work: LIPID AND LFT TODAY If you have labs (blood work) drawn today and your tests are completely normal, you will receive your results only by:  MyChart Message (if you have MyChart) OR A paper copy in the mail.  If you have any lab test that is abnormal or we need to change your treatment, we will call you to review the results. You may go to any [facility] that is convenient for you however, we do have a lab in our office that is able to assist you. You DO NOT need an appointment for our lab. The lab is open 8:00am and closes at 4:00pm. Lunch 12:45 - 1:45pm.  Testing/Procedures: Your physician has recommended that you have a sleep study. This test records several body functions during sleep, including: brain activity, eye movement, oxygen and carbon dioxide blood levels, heart rate and rhythm, breathing rate and rhythm, the flow of air through your mouth and nose, snoring, body muscle movements, and chest and belly movement.   Follow-Up: Your next appointment:  AFTER SLEEP STUDY  In Person with Thurmon Fair, MD   At [facility], you and your health needs are our priority.  As part of our continuing mission to provide you with exceptional heart care, we have created designated Provider Care Teams.  These Care Teams include your primary Cardiologist (physician) and Advanced Practice Providers (APPs -  Physician Assistants and Nurse Practitioners) who all work together to provide you with the care you need, when you need it.  We recommend signing up for the patient portal called "MyChart".  Sign up information is provided on this After Visit Summary.  MyChart is used to connect with patients for Virtual  Visits (Telemedicine).  Patients are able to view lab/test results, encounter notes, upcoming appointments, etc.  Non-urgent messages can be sent to your provider as well.   To learn more about what you can do with MyChart, go to [email or web address].

## 2021-04-17 ENCOUNTER — Other Ambulatory Visit: Payer: Self-pay

## 2021-04-17 ENCOUNTER — Telehealth: Payer: Self-pay | Admitting: General Practice

## 2021-04-17 DIAGNOSIS — E7800 Pure hypercholesterolemia, unspecified: Secondary | ICD-10-CM

## 2021-04-17 DIAGNOSIS — I2510 Atherosclerotic heart disease of native coronary artery without angina pectoris: Secondary | ICD-10-CM

## 2021-04-17 MED ORDER — EZETIMIBE 10 MG PO TABS: 10.0000 mg | ORAL_TABLET | Freq: Every day | ORAL | 3 refills | Status: DC

## 2021-04-17 NOTE — Progress Notes (Signed)
lipid

## 2021-04-17 NOTE — Telephone Encounter (Signed)
Notified patients wife medication was resent to the correct pharmacy.

## 2021-04-17 NOTE — Telephone Encounter (Signed)
Follow Up:     Patient's wife said his  medicine yesterday was sent to the wrong pharmacy.   *STAT* If patient is at the pharmacy, call can be transferred to refill team.   1. Which medications need to be refilled? (please list name of each medication and dose if known) new prescription for Ezetimibe  2. Which pharmacy/location (including street and city if local pharmacy) is medication to be sent to? [facility] [address]  [place] [phone]  3. Do they need a 30 day or 90 day supply? 90 days and refills

## 2021-04-17 NOTE — Telephone Encounter (Signed)
Already spoke to patient's wife about patient's lab results.

## 2021-04-17 NOTE — Telephone Encounter (Signed)
New message:      Patient wife calling stating that Mrs. Elnita Maxwell told her to call her today.

## 2021-04-18 ENCOUNTER — Telehealth: Payer: Self-pay | Admitting: *Deleted

## 2021-04-18 LAB — HEPATIC FUNCTION PANEL
ALT: 18 IU/L (ref 0–44)
AST: 11 IU/L (ref 0–40)
Albumin: 5 g/dL — ABNORMAL HIGH (ref 3.8–4.8)
Alkaline Phosphatase: 78 IU/L (ref 44–121)
Bilirubin Total: 0.5 mg/dL (ref 0.0–1.2)
Bilirubin, Direct: 0.14 mg/dL (ref 0.00–0.40)
Total Protein: 6.9 g/dL (ref 6.0–8.5)

## 2021-04-18 LAB — LIPID PANEL
Chol/HDL Ratio: 3.1 ratio (ref 0.0–5.0)
Cholesterol, Total: 130 mg/dL (ref 100–199)
HDL: 42 mg/dL (ref >39)
LDL Chol Calc (NIH): 58 mg/dL (ref 0–99)
Triglycerides: 182 mg/dL — ABNORMAL HIGH (ref 0–149)
VLDL Cholesterol Cal: 30 mg/dL (ref 5–40)

## 2021-04-18 NOTE — Telephone Encounter (Signed)
-----   Message from Alyson Ingles, LPN sent at 01/08/5092  3:10 PM EDT ----- Regarding: SLEEP STUDY Sleep study ordered, [facility] entered

## 2021-04-18 NOTE — Telephone Encounter (Signed)
Patient notified of sleep study appointment. 

## 2021-04-19 ENCOUNTER — Other Ambulatory Visit: Payer: Self-pay

## 2021-04-19 DIAGNOSIS — Z79899 Other long term (current) drug therapy: Secondary | ICD-10-CM

## 2021-04-19 DIAGNOSIS — E7800 Pure hypercholesterolemia, unspecified: Secondary | ICD-10-CM

## 2021-04-19 DIAGNOSIS — I25118 Atherosclerotic heart disease of native coronary artery with other forms of angina pectoris: Secondary | ICD-10-CM

## 2021-04-19 DIAGNOSIS — I2510 Atherosclerotic heart disease of native coronary artery without angina pectoris: Secondary | ICD-10-CM

## 2021-04-19 DIAGNOSIS — E1169 Type 2 diabetes mellitus with other specified complication: Secondary | ICD-10-CM

## 2021-04-25 ENCOUNTER — Ambulatory Visit: Payer: Medicare Other | Admitting: Orthopedic Surgery

## 2021-05-16 ENCOUNTER — Ambulatory Visit: Payer: Medicare Other | Admitting: General Practice

## 2021-05-20 ENCOUNTER — Ambulatory Visit (HOSPITAL_BASED_OUTPATIENT_CLINIC_OR_DEPARTMENT_OTHER): Payer: Medicare Other | Attending: General Practice | Admitting: Cardiovascular Disease

## 2021-05-22 ENCOUNTER — Emergency Department (HOSPITAL_COMMUNITY)
Admission: EM | Admit: 2021-05-22 | Discharge: 2021-05-22 | Disposition: A | Discharge status: Home / Self Care | Payer: Medicare Other | Attending: Emergency Medicine | Admitting: Emergency Medicine

## 2021-05-22 ENCOUNTER — Other Ambulatory Visit: Payer: Self-pay

## 2021-05-22 ENCOUNTER — Encounter (HOSPITAL_COMMUNITY): Payer: Self-pay | Admitting: Emergency Medicine

## 2021-05-22 ENCOUNTER — Emergency Department (HOSPITAL_COMMUNITY): Payer: Medicare Other

## 2021-05-22 DIAGNOSIS — Z794 Long term (current) use of insulin: Secondary | ICD-10-CM | POA: Insufficient documentation

## 2021-05-22 DIAGNOSIS — I2510 Atherosclerotic heart disease of native coronary artery without angina pectoris: Secondary | ICD-10-CM | POA: Insufficient documentation

## 2021-05-22 DIAGNOSIS — Z7984 Long term (current) use of oral hypoglycemic drugs: Secondary | ICD-10-CM | POA: Insufficient documentation

## 2021-05-22 DIAGNOSIS — E785 Hyperlipidemia, unspecified: Secondary | ICD-10-CM | POA: Insufficient documentation

## 2021-05-22 DIAGNOSIS — Z7901 Long term (current) use of anticoagulants: Secondary | ICD-10-CM | POA: Insufficient documentation

## 2021-05-22 DIAGNOSIS — R600 Localized edema: Secondary | ICD-10-CM | POA: Diagnosis not present

## 2021-05-22 DIAGNOSIS — I4891 Unspecified atrial fibrillation: Secondary | ICD-10-CM | POA: Insufficient documentation

## 2021-05-22 DIAGNOSIS — E1169 Type 2 diabetes mellitus with other specified complication: Secondary | ICD-10-CM | POA: Insufficient documentation

## 2021-05-22 DIAGNOSIS — I10 Essential (primary) hypertension: Secondary | ICD-10-CM | POA: Insufficient documentation

## 2021-05-22 DIAGNOSIS — M545 Low back pain, unspecified: Secondary | ICD-10-CM | POA: Diagnosis present

## 2021-05-22 DIAGNOSIS — Z79899 Other long term (current) drug therapy: Secondary | ICD-10-CM | POA: Diagnosis not present

## 2021-05-22 DIAGNOSIS — M5441 Lumbago with sciatica, right side: Secondary | ICD-10-CM | POA: Insufficient documentation

## 2021-05-22 LAB — BASIC METABOLIC PANEL
Anion gap: 9 (ref 5–15)
BUN: 18 mg/dL (ref 8–23)
CO2: 24 mmol/L (ref 22–32)
Calcium: 9.4 mg/dL (ref 8.9–10.3)
Chloride: 99 mmol/L (ref 98–111)
Creatinine, Ser: 0.71 mg/dL (ref 0.61–1.24)
GFR, Estimated: >60 mL/min (ref >60)
Glucose, Bld: 186 mg/dL — ABNORMAL HIGH (ref 70–99)
Potassium: 3.8 mmol/L (ref 3.5–5.1)
Sodium: 132 mmol/L — ABNORMAL LOW (ref 135–145)

## 2021-05-22 LAB — CBC
HCT: 53.1 % — ABNORMAL HIGH (ref 39.0–52.0)
Hemoglobin: 17 g/dL (ref 13.0–17.0)
MCH: 25 pg — ABNORMAL LOW (ref 26.0–34.0)
MCHC: 32 g/dL (ref 30.0–36.0)
MCV: 78.2 fL — ABNORMAL LOW (ref 80.0–100.0)
Platelets: 201 10*3/uL (ref 150–400)
RBC: 6.79 MIL/uL — ABNORMAL HIGH (ref 4.22–5.81)
RDW: 16.4 % — ABNORMAL HIGH (ref 11.5–15.5)
WBC: 11.6 10*3/uL — ABNORMAL HIGH (ref 4.0–10.5)
nRBC: 0 % (ref 0.0–0.2)

## 2021-05-22 LAB — URINALYSIS, ROUTINE W REFLEX MICROSCOPIC
Bacteria, UA: NONE SEEN
Bilirubin Urine: NEGATIVE
Glucose, UA: >=500 mg/dL — AB
Hgb urine dipstick: NEGATIVE
Ketones, ur: 20 mg/dL — AB
Leukocytes,Ua: NEGATIVE
Nitrite: NEGATIVE
Protein, ur: 30 mg/dL — AB
Specific Gravity, Urine: 1.037 — ABNORMAL HIGH (ref 1.005–1.030)
pH: 5 (ref 5.0–8.0)

## 2021-05-22 LAB — BRAIN NATRIURETIC PEPTIDE: B Natriuretic Peptide: 19.5 pg/mL (ref 0.0–100.0)

## 2021-05-22 MED ORDER — METHOCARBAMOL 500 MG PO TABS: 500.0000 mg | ORAL_TABLET | Freq: Every day | ORAL | 0 refills | Status: AC

## 2021-05-22 MED ORDER — LIDOCAINE 5 % EX PTCH: 1.0000 | MEDICATED_PATCH | CUTANEOUS | 0 refills | Status: AC

## 2021-05-22 MED ORDER — OXYCODONE-ACETAMINOPHEN 5-325 MG PO TABS
1.0000 | ORAL_TABLET | Freq: Once | ORAL | Status: AC
  Administered 2021-05-22: 1 via ORAL
  Filled 2021-05-22: qty 1

## 2021-05-22 MED ORDER — OXYCODONE-ACETAMINOPHEN 5-325 MG PO TABS
1.0000 | ORAL_TABLET | Freq: Once | ORAL | Status: AC
Start: 2021-05-22 — End: 2021-05-22
  Administered 2021-05-22: 1 via ORAL
  Filled 2021-05-22: qty 1

## 2021-05-22 NOTE — ED Triage Notes (Signed)
Patient having lower back pain since Saturday morning.  He has been awake for the last three days due to the pain.  Patient states that he has been working on his wife's car and now the pain is now unbearable.

## 2021-05-22 NOTE — Discharge Instructions (Addendum)
At this time there does not appear to be the presence of an emergent medical condition, however there is always the potential for conditions to change. Please read and follow the below instructions.  Please return to the Emergency Department immediately for any new or worsening symptoms. Please be sure to follow up with your Primary Care Provider within one week regarding your visit today; please call their office to schedule an appointment even if you are feeling better for a follow-up visit. Please call your cardiologist today to discuss your lower leg edema to see if there is any medications they would like to start you on or if you need any other testing. Please call the on-call orthopedic doctor Dr. Steward Drone for further evaluation of your lower back pain. You may use the muscle relaxer Robaxin as prescribed to help with your symptoms.  Do not drive or operate heavy machinery while taking Robaxin as it will make you drowsy.  Do not drink alcohol or take other sedating medications while taking Robaxin as this will worsen side effects. You may use the Lidoderm patch as prescribed to help with your symptoms.  Lidoderm may be expensive so you may speak with your pharmacist about finding over-the-counter medications that work similarly.  Go to the nearest Emergency Department immediately if: You have fever or chills You cannot control when you pee (urinate) or poop (have a bowel movement). You have weakness in any of these areas and it gets worse: Lower back. The area between your hip bones. Butt. Legs. You have redness or swelling of your back. You have a burning feeling when you pee. You have shortness of breath or chest pain. You cannot breathe when you lie down. You have pain, redness, or warmth in the swollen areas. You have heart, liver, or kidney disease and get edema all of a sudden. You have any new/concerning or worsening of symptoms.   Please read the additional information packets  attached to your discharge summary.  Do not take your medicine if  develop an itchy rash, swelling in your mouth or lips, or difficulty breathing; call 911 and seek immediate emergency medical attention if this occurs.  You may review your lab tests and imaging results in their entirety on your MyChart account.  Please discuss all results of fully with your primary care provider and other specialist at your follow-up visit.  Note: Portions of this text may have been transcribed using voice recognition software. Every effort was made to ensure accuracy; however, inadvertent computerized transcription errors may still be present.

## 2021-05-22 NOTE — ED Provider Notes (Signed)
Emergency Medicine Provider Triage Evaluation Note  Chad Maddox , a 65 y.o. male  was evaluated in triage.  Pt complains of back pain.  The patient reports right-sided back pain for the last 2 days.  He reports that he was working under his wife's car for about 15 minutes and the pain began when he finished the car work.  He denies falling or twisting his back during the work.  States that he has previously done this numerous times without similar pain.  He reports associated bilateral lower extremity swelling that began after his back pain started. He states the pain radiates down his right leg to his knee.  He has been unable to sleep for the last 3 days due to the pain.  He denies chest pain, shortness of breath, difficulty urinating, dysuria, numbness, weakness.   Review of Systems  Positive: Back pain, leg swelling Negative: Dysuria, hematuria, chest pain, shortness of breath, numbness, weakness  Physical Exam  BP (!) 152/88 (BP Location: Right Arm)   Pulse 69   Temp 98.5 F (36.9 C) (Oral)   Resp 18   SpO2 96%  Gen:   Awake, no distress   Resp:  Normal effort  MSK:   Moves extremities without difficulty.  Tender palpation over the right SI joint.  No midline tenderness. Other:  Pitting edema noted to the bilateral lower extremities.  Medical Decision Making  Medically screening exam initiated at 3:28 AM.  Appropriate orders placed.  [name] was informed that the remainder of the evaluation will be completed by another provider, this initial triage assessment does not replace that evaluation, and the importance of remaining in the ED until their evaluation is complete.  Labs and imaging have been ordered.  He will require further work-up and evaluation in the emergency department.   Barkley Boards, PA-C 05/22/21 [id number]    Shon Baton, MD 05/22/21 [phone]

## 2021-05-22 NOTE — ED Provider Notes (Signed)
[facility] EMERGENCY DEPARTMENT Provider Note   CSN: [id number] Arrival date & time: 05/22/21  [id number]     History Chief Complaint  Patient presents with   Back Pain    Chad Maddox is a 65 y.o. male history of CAD/MI, diabetes, GERD, hypertension, hyperlipidemia, obesity, on Xarelto.  Patient presented for right low back pain onset yesterday, patient reports he was working on a car, bending down underneath it when he got up he noticed pain to his right low back will radiate down the back of his right leg to the level of his knee, pain is sharp/shooting, severe, constant, worsened with certain movement such as bending and twisting at the lumbar spine, improves with rest and Percocet given in triage.  Patient denies fall/injury, fever, chills, abdominal pain, dysuria/hematuria, saddle paresthesias, bowel/bladder incontinence, urinary retention, IVDU, history of cancer.  Additionally patient reports bilateral lower extremity edema he reports he has had mild edema in the past but has gotten worse over the past few weeks despite compliance with all of his home medications.  He denies any associated chest pain or shortness of breath. HPI     Past Medical History:  Diagnosis Date   Coronary artery disease    s/p NSTEMI 01/2008  -- s/p BMS to RCA 01/2008  --  s/p XIENCE DES TO OM2 12/2008  --  OTW NONOBS DZS AND PATENT RCA STENT, EF 60%    Diabetes mellitus    GERD (gastroesophageal reflux disease)    Hyperlipidemia, mixed    Hypertension    Obesity     Patient Active Problem List   Diagnosis Date Noted   Pain in left wrist 04/16/2021   Sepsis ([place]) 12/15/2019   Cellulitis of right foot 12/15/2019   Atrial fibrillation with RVR ([place]) 12/15/2019   Hyperlipidemia 04/19/2016   HYPERTENSION, BENIGN 07/12/2010   CAD, NATIVE VESSEL 06/23/2009   Diabetes mellitus type 2 in obese ([place]) 10/28/2008   OBESITY 10/28/2008   LEUKOCYTOSIS 10/28/2008   GERD 10/28/2008     Past Surgical History:  Procedure Laterality Date   CARDIAC CATHETERIZATION  12/22/2008    Triple-vessel coronary artery disease -- Patent stent in the proximal right coronary artery -- Severe stenosis of the second obtuse marginal branch -- Successful percutaneous coronary intervention with placement of a Xience drug-eluting stent in the second obtuse marginal branch.  -- Normal left ventricular systolic function       Family History  Problem Relation Age of Onset   Cerebral aneurysm Mother    Diabetes Mother    COPD Father    Bone cancer Father    Lung cancer Father    Lymphoma Sister     Social History   Tobacco Use   Smoking status: Never   Smokeless tobacco: Never  Substance Use Topics   Alcohol use: No   Drug use: No    Home Medications Prior to Admission medications   Medication Sig Start Date End Date Taking? Authorizing Provider  lidocaine (LIDODERM) 5 % Place 1 patch onto the skin daily. Remove & Discard patch within 12 hours or as directed by MD 05/22/21  Yes Nuala Alpha A, PA-C  methocarbamol (ROBAXIN) 500 MG tablet Take 1 tablet (500 mg total) by mouth daily for 7 days. 05/22/21 05/29/21 Yes Nuala Alpha A, PA-C  metoprolol tartrate (LOPRESSOR) 25 MG tablet Take 1/2 tablet ( 12.5 mg ) twice a day 04/17/21   [place], Peter M, MD  amLODipine (NORVASC) 2.5 MG tablet  Take 1 tablet (2.5 mg total) by mouth daily. 03/21/21 06/19/21  [place], Peter M, MD  empagliflozin (JARDIANCE) 25 MG TABS tablet Take 25 mg daily 03/15/21   [place], Peter M, MD  ezetimibe (ZETIA) 10 MG tablet Take 1 tablet (10 mg total) by mouth daily. 04/17/21 07/16/21  Deberah Pelton, NP  glipiZIDE (GLUCOTROL XL) 10 MG 24 hr tablet Take 10 mg by mouth daily.      [provider]  insulin glargine, 1 Unit Dial, (TOUJEO SOLOSTAR) 300 UNIT/ML Solostar Pen Take 30 uts  every morning 03/15/21   [place], Peter M, MD  metFORMIN (GLUCOPHAGE-XR) 500 MG 24 hr tablet Take 1,000 mg by mouth 2 (two)  times daily.      [provider]  nitroGLYCERIN (NITROSTAT) 0.4 MG SL tablet Place 1 tablet (0.4 mg total) under the tongue every 5 (five) minutes as needed. Patient taking differently: Place 0.4 mg under the tongue every 5 (five) minutes as needed for chest pain. 04/19/16   [place], Peter M, MD  rivaroxaban (XARELTO) 20 MG TABS tablet Take 1 tablet (20 mg total) by mouth daily with supper. 03/15/21   [place], Peter M, MD  rosuvastatin (CRESTOR) 40 MG tablet Take 1 tablet (40 mg total) by mouth daily. 03/15/21   [place], Peter M, MD    Allergies    Shellfish allergy and Penicillins  Review of Systems   Review of Systems Ten systems are reviewed and are negative for acute change except as noted in the HPI  Physical Exam Updated Vital Signs BP 128/79   Pulse 76   Temp 98 F (36.7 C) (Oral)   Resp 14   SpO2 100%   Physical Exam Constitutional:      General: He is not in acute distress.    Appearance: Normal appearance. He is well-developed. He is not ill-appearing or diaphoretic.  HENT:     Head: Normocephalic and atraumatic.  Eyes:     General: Vision grossly intact. Gaze aligned appropriately.     Pupils: Pupils are equal, round, and reactive to light.  Neck:     Trachea: Trachea and phonation normal.  Pulmonary:     Effort: Pulmonary effort is normal. No respiratory distress.  Abdominal:     General: There is no distension.     Palpations: Abdomen is soft.     Tenderness: There is no abdominal tenderness. There is no guarding or rebound.  Musculoskeletal:        General: Normal range of motion.     Cervical back: Normal range of motion.     Comments: Back: No overlying skin changes or evidence of trauma.  No midline spinal tenderness palpation.  No crepitus step-off or deformity of the spine.  Right lumbar paraspinal muscular tenderness and right SI joint tenderness palpation.  No crepitus or instability of the pelvis and no pain with pelvic compression.  Capillary  refill and sensation intact to bilateral lower extremities.  Strong equal pedal pulses.  Patient has strong and equal movement with bilateral hip flexion/extension, knee flexion/extension and dorsi/plantar flexion.  DTR 2+ bilateral patella.  No clonus of the feet.  ROM of the lumbar spine is somewhat limited with flexion, lateral bend and twist due to pain.  Skin:    General: Skin is warm and dry.  Neurological:     Mental Status: He is alert.     GCS: GCS eye subscore is 4. GCS verbal subscore is 5. GCS motor subscore is 6.  Comments: Speech is clear and goal oriented, follows commands Major Cranial nerves without deficit, no facial droop Moves extremities without ataxia, coordination intact  Psychiatric:        Behavior: Behavior normal.    ED Results / Procedures / Treatments   Labs (all labs ordered are listed, but only abnormal results are displayed) Labs Reviewed  BASIC METABOLIC PANEL - Abnormal; Notable for the following components:      Result Value   Sodium 132 (*)    Glucose, Bld 186 (*)    All other components within normal limits  URINALYSIS, ROUTINE W REFLEX MICROSCOPIC - Abnormal; Notable for the following components:   Specific Gravity, Urine 1.037 (*)    Glucose, UA >=500 (*)    Ketones, ur 20 (*)    Protein, ur 30 (*)    All other components within normal limits  CBC - Abnormal; Notable for the following components:   WBC 11.6 (*)    RBC 6.79 (*)    HCT 53.1 (*)    MCV 78.2 (*)    MCH 25.0 (*)    RDW 16.4 (*)    All other components within normal limits  BRAIN NATRIURETIC PEPTIDE    EKG None  Radiology DG Lumbar Spine Complete  Result Date: 05/22/2021 CLINICAL DATA:  65 year old male with back pain for 3 days. EXAM: LUMBAR SPINE - COMPLETE 4+ VIEW COMPARISON:  Lumbar radiographs 09/04/2011. FINDINGS: Normal lumbar segmentation. Stable lumbar lordosis since 2013. Mild levoconvex lumbar scoliosis but no significant spondylolisthesis. Widespread lower  thoracic and lumbar chronic disc and endplate degeneration. Disc space loss and vacuum disc at progressed at L5-S1. Endplate spurring has increased at multiple levels. Chronic disc space loss also at L3-L4. No pars fracture. No acute osseous abnormality identified. Visible sacrum and SI joints appear intact. Calcified aortic atherosclerosis. Pelvic vascular and dystrophic calcifications. Negative visible lung bases, bowel gas pattern. IMPRESSION: 1. No acute osseous abnormality identified in the lumbar spine. 2. Progressed since 2013 chronic disc and endplate degeneration, maximal at L5-S1. 3.  Aortic Atherosclerosis (ICD10-I70.0). Electronically Signed   By: Odessa Fleming M.D.   On: 05/22/2021 04:06    Procedures Procedures   Medications Ordered in ED Medications  oxyCODONE-acetaminophen (PERCOCET/ROXICET) 5-325 MG per tablet 1 tablet (1 tablet Oral Given 05/22/21 0337)  oxyCODONE-acetaminophen (PERCOCET/ROXICET) 5-325 MG per tablet 1 tablet (1 tablet Oral Given 05/22/21 1318)    ED Course  I have reviewed the triage vital signs and the nursing notes.  Pertinent labs & imaging results that were available during my care of the patient were reviewed by me and considered in my medical decision making (see chart for details).    MDM Rules/Calculators/A&P                           Additional history obtained from: Nursing notes from this visit. Review of electronic medical records. --------------------- 65 year old male presented for right low back pain and right-sided sciatica onset yesterday after working on his car.  He has no history of trauma no evidence of injury on examination.  He has a reassuring neurologic examination.  No infectious symptoms.  No red flags to suggest cauda equina, AAA, dissection, spinal epidural abscess, kidney stone disease or other emergent causes of right low back pain with right-sided sciatica.  X-ray of the lumbar spine was obtained which does not show any acute  abnormalities but does show chronic disc disease and endplate degeneration which may  be contributing to patient's symptoms.  Patient cannot be treated with NSAIDs or steroids as he is a diabetic and on blood thinners.  We will treat the patient with muscle relaxers, discussed muscle laxer precautions with the patient and he stated understanding.  We will also give Lidoderm patches for comfort and referred to orthopedics on-call Dr. Sammuel Hines.  Additionally patient was inquiring about bilateral lower extremity edema he has 1+ bilateral pitting edema on examination I reviewed prior cardiology notes and they noted that patient did have nonpitting edema on exam in September of this year may have worsened.  He has a normal BNP today.  He has no associated chest pain or shortness of breath I feel that patient's lower extremity edema can be worked up as outpatient through his cardiologist office.  I encouraged patient to call his cardiologist office today for reevaluation.  I reviewed labs today including a normal BNP.  CBC which shows a nonspecific mild leukocytosis of 11.6 however low sufficient for infection at this time given lack of infectious type symptoms.  No anemia or thrombocytopenia.  BMP showed minimal hyponatremia of 132, no emergent electrolyte derangement, AKI or gap, bicarb is normal.  Urinalysis showed glucose which is consistent with his use of Jardiance, ketones 20 proteins 30, patient does not appear to be in DKA suspect this is secondary to some mild dehydration, patient tolerating p.o. here in the ER, encouraged him to drink plenty water when he gets home to avoid dehydration   At this time there does not appear to be any evidence of an acute emergency medical condition and the patient appears stable for discharge with appropriate outpatient follow up. Diagnosis was discussed with patient who verbalizes understanding of care plan and is agreeable to discharge. I have discussed return precautions  with patient who verbalizes understanding. Patient encouraged to follow-up with their PCP orthopedist and cardiologist.. All questions answered.   Patient's case discussed with Dr. Maryan Rued who agrees with plan to discharge with follow-up.   Note: Portions of this report may have been transcribed using voice recognition software. Every effort was made to ensure accuracy; however, inadvertent computerized transcription errors may still be present.  Final Clinical Impression(s) / ED Diagnoses Final diagnoses:  Acute right-sided low back pain with right-sided sciatica  Lower extremity edema    Rx / DC Orders ED Discharge Orders          Ordered    lidocaine (LIDODERM) 5 %  Every 24 hours        05/22/21 1513    methocarbamol (ROBAXIN) 500 MG tablet  Daily        05/22/21 1513             Gari Crown 05/22/21 1516    Blanchie Dessert, MD 05/28/21 1323

## 2021-05-24 ENCOUNTER — Ambulatory Visit: Payer: Medicare Other | Admitting: Surgical

## 2021-06-22 ENCOUNTER — Ambulatory Visit: Payer: Medicare Other | Admitting: Endocrinology

## 2021-07-03 ENCOUNTER — Telehealth: Payer: Self-pay

## 2021-07-03 NOTE — Telephone Encounter (Signed)
Letter has been sent to patient instructing them to call us if they are still interested in completing their sleep study. If we have not received a response from the patient within 30 days of this notice, the order will be cancelled and they will need to discuss the need for a sleep study at their next office visit.  ° °

## 2021-09-07 ENCOUNTER — Ambulatory Visit: Payer: Medicare Other | Admitting: Orthopedic Surgery

## 2021-09-19 ENCOUNTER — Ambulatory Visit: Payer: Medicare Other | Admitting: Orthopedic Surgery

## 2021-09-19 ENCOUNTER — Other Ambulatory Visit: Payer: Self-pay

## 2021-09-19 ENCOUNTER — Ambulatory Visit: Payer: Self-pay

## 2021-09-19 ENCOUNTER — Encounter: Payer: Self-pay | Admitting: Orthopedic Surgery

## 2021-09-19 DIAGNOSIS — M25552 Pain in left hip: Secondary | ICD-10-CM
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
## 2021-11-26 ENCOUNTER — Telehealth: Payer: Self-pay | Admitting: Orthopedic Surgery

## 2021-11-26 NOTE — Telephone Encounter (Signed)
He does not really qualify from an orthopedic standpoint but may qualify for medical standpoint.  Medical standpoint is the glucose part of this but orthopedically he is good to go for jury duty

## 2021-11-26 NOTE — Telephone Encounter (Signed)
Chad Maddox has jury duty on 5-17. He is wondering if Dr. August Saucer could write a note excusing him from jury duty due to medical reasons? The deadline for the court to receive excuse note is tomorrow. Please advise.  ?

## 2021-11-27 NOTE — Telephone Encounter (Signed)
Tried calling to discuss. No answer. LM for patient to call back to discuss.  ?

## 2021-12-04 ENCOUNTER — Ambulatory Visit: Payer: Medicare Other | Admitting: Orthopedic Surgery

## 2021-12-13 ENCOUNTER — Encounter: Payer: Self-pay | Admitting: Orthopedic Surgery

## 2021-12-13 ENCOUNTER — Ambulatory Visit: Payer: Medicare Other | Admitting: Orthopedic Surgery

## 2021-12-13 DIAGNOSIS — I96 Gangrene, not elsewhere classified: Secondary | ICD-10-CM

## 2021-12-13 DIAGNOSIS — I739 Peripheral vascular disease, unspecified: Secondary | ICD-10-CM

## 2021-12-13 DIAGNOSIS — M79672 Pain in left foot: Secondary | ICD-10-CM | POA: Diagnosis not present

## 2021-12-13 NOTE — Progress Notes (Signed)
Office Visit Note   Patient: Chad Maddox           Date of Birth: [birth year]           MRN: [id number] Visit Date: 12/13/2021              Requested by: No referring provider defined for this encounter. PCP: Pcp, No  Chief Complaint  Patient presents with   Left Foot - Pain      HPI: Patient is a 66 year old gentleman who presents with painful gangrenous changes to the right fourth and fifth toe.  Patient complains of pain at night.  States that his symptoms started when he stubbed his toes 2 weeks ago.  Patient also complains of cracked dry skin on the left heel.  Assessment & Plan: Visit Diagnoses:  1. Pain in left foot   2. PVD (peripheral vascular disease) (HCC)   3. Gangrene of toe of right foot (HCC)     Plan: Discussed with the patient that he has gangrenous changes to the fourth and fifth toe with severe peripheral vascular disease.  We will make a stat consult with vascular vein surgery.  Follow-Up Instructions: Return if symptoms worsen or fail to improve.   Ortho Exam  Patient is alert, oriented, no adenopathy, well-dressed, normal affect, normal respiratory effort. Examination patient does not have a palpable dorsalis pedis or posterior tibial pulse.  There is wet gangrenous changes to the fourth toe and fifth toe on the right foot but no ascending cellulitis no purulent drainage.  The Doppler was used and he has a dampened monophasic dorsalis pedis and posterior tibial pulse.  There is dry cracked skin on the left heel this was pared there is no deep infection no cellulitis.  Patient's hemoglobin A1c is consistently been greater than 11 and last A1c last year was 11.1.  Imaging: No results found. No images are attached to the encounter.  Labs: Lab Results  Component Value Date   HGBA1C 11.1 (H) 03/15/2021   HGBA1C 12.2 (H) 02/24/2020   HGBA1C 11.0 (H) 11/29/2019   REPTSTATUS 12/22/2019 FINAL 12/17/2019   REPTSTATUS 12/22/2019 FINAL 12/17/2019    CULT  12/17/2019    NO GROWTH 5 DAYS Performed at [facility], [address], [place], [place] [address]    CULT  12/17/2019    NO GROWTH 5 DAYS Performed at [facility], [address], [place], [place] [address]    LABORGA GROUP B STREP(S.AGALACTIAE)ISOLATED 12/15/2019     Lab Results  Component Value Date   ALBUMIN 5.0 (H) 04/16/2021   ALBUMIN 4.5 03/15/2021   ALBUMIN 3.9 12/15/2019    Lab Results  Component Value Date   MG 1.6 (L) 12/16/2019   MG 1.6 (L) 12/16/2019   MG 2.0 01/19/2008   No results found for: VD25OH  No results found for: PREALBUMIN    Latest Ref Rng & Units 05/22/2021    3:59 AM 03/15/2021    8:58 AM 12/18/2019    3:10 AM  CBC EXTENDED  WBC 4.0 - 10.5 K/uL 11.6   10.2   9.1    RBC 4.22 - 5.81 MIL/uL 6.79   6.75   5.59    Hemoglobin 13.0 - 17.0 g/dL 09.9   83.3   82.5    HCT 39.0 - 52.0 % 53.1   53.3   43.4    Platelets 150 - 400 K/uL 201   230   167    NEUT#  1.4 - 7.0 x10E3/uL  6.5     Lymph# 0.7 - 3.1 x10E3/uL  2.2        There is no height or weight on file to calculate BMI.  Orders:  Orders Placed This Encounter  Procedures   Ambulatory referral to Vascular Surgery   No orders of the defined types were placed in this encounter.    Procedures: No procedures performed  Clinical Data: No additional findings.  ROS:  All other systems negative, except as noted in the HPI. Review of Systems  Objective: Vital Signs: There were no vitals taken for this visit.  Specialty Comments:  No specialty comments available.  PMFS History: Patient Active Problem List   Diagnosis Date Noted   Pain in left wrist 04/16/2021   Sepsis (HCC) 12/15/2019   Cellulitis of right foot 12/15/2019   Atrial fibrillation with RVR (HCC) 12/15/2019   Hyperlipidemia 04/19/2016   HYPERTENSION, BENIGN 07/12/2010   CAD, NATIVE VESSEL 06/23/2009   Diabetes mellitus type 2 in obese (HCC) 10/28/2008   OBESITY 10/28/2008   LEUKOCYTOSIS  10/28/2008   GERD 10/28/2008   Past Medical History:  Diagnosis Date   Coronary artery disease    s/p NSTEMI 01/2008  -- s/p BMS to RCA 01/2008  --  s/p XIENCE DES TO OM2 12/2008  --  OTW NONOBS DZS AND PATENT RCA STENT, EF 60%    Diabetes mellitus    GERD (gastroesophageal reflux disease)    Hyperlipidemia, mixed    Hypertension    Obesity     Family History  Problem Relation Age of Onset   Cerebral aneurysm Mother    Diabetes Mother    COPD Father    Bone cancer Father    Lung cancer Father    Lymphoma Sister     Past Surgical History:  Procedure Laterality Date   CARDIAC CATHETERIZATION  12/22/2008    Triple-vessel coronary artery disease -- Patent stent in the proximal right coronary artery -- Severe stenosis of the second obtuse marginal branch -- Successful percutaneous coronary intervention with placement of a Xience drug-eluting stent in the second obtuse marginal branch.  -- Normal left ventricular systolic function   Social History   Occupational History   Not on file  Tobacco Use   Smoking status: Never   Smokeless tobacco: Never  Substance and Sexual Activity   Alcohol use: No   Drug use: No   Sexual activity: Not on file

## 2021-12-18 ENCOUNTER — Other Ambulatory Visit (HOSPITAL_COMMUNITY): Payer: Self-pay | Admitting: Vascular Surgery

## 2021-12-18 ENCOUNTER — Other Ambulatory Visit: Payer: Self-pay

## 2021-12-18 ENCOUNTER — Encounter: Payer: Self-pay | Admitting: Vascular Surgery

## 2021-12-18 ENCOUNTER — Ambulatory Visit: Payer: Medicare Other | Admitting: Vascular Surgery

## 2021-12-18 ENCOUNTER — Ambulatory Visit (HOSPITAL_COMMUNITY)
Admission: RE | Admit: 2021-12-18 | Discharge: 2021-12-18 | Disposition: A | Discharge status: Home / Self Care | Payer: Medicare Other | Source: Ambulatory Visit | Attending: Vascular Surgery | Admitting: Vascular Surgery

## 2021-12-18 DIAGNOSIS — E1151 Type 2 diabetes mellitus with diabetic peripheral angiopathy without gangrene: Secondary | ICD-10-CM | POA: Insufficient documentation

## 2021-12-18 DIAGNOSIS — I70221 Atherosclerosis of native arteries of extremities with rest pain, right leg: Secondary | ICD-10-CM

## 2021-12-18 NOTE — Progress Notes (Unsigned)
Pre op letter and instructions given to pt.

## 2021-12-18 NOTE — Progress Notes (Signed)
Patient name: Chad Maddox MRN: [id number] DOB: 1956/04/17 Sex: male  REASON FOR CONSULT: Right foot ischemic toes  HPI: Chad Maddox is a 66 y.o. male, with history of coronary artery disease s/p PCI, atrial fibrillation on Xarelto, diabetes, hypertension, hyperlipidemia that presents as a referral from Dr. Sharol Given for evaluation of ischemic toes on the right foot.  He stumped his foot about 3 weeks ago and this has not healed.  He was seen by Dr. Sharol Given who then recommended referral to vascular surgery given he could only get monophasic signals in the right foot with no palpable pulses.  Patient denies any previous lower extremity vascular interventions.  He does not smoke.  States he is not taking Xarelto for his atrial fibrillation.  States he cannot see his toes.  Works as a Production assistant, radio.    Past Medical History:  Diagnosis Date   Coronary artery disease    s/p NSTEMI 01/2008  -- s/p BMS to RCA 01/2008  --  s/p XIENCE DES TO OM2 12/2008  --  OTW NONOBS DZS AND PATENT RCA STENT, EF 60%    Diabetes mellitus    GERD (gastroesophageal reflux disease)    Hyperlipidemia, mixed    Hypertension    Obesity     Past Surgical History:  Procedure Laterality Date   CARDIAC CATHETERIZATION  12/22/2008    Triple-vessel coronary artery disease -- Patent stent in the proximal right coronary artery -- Severe stenosis of the second obtuse marginal branch -- Successful percutaneous coronary intervention with placement of a Xience drug-eluting stent in the second obtuse marginal branch.  -- Normal left ventricular systolic function    Family History  Problem Relation Age of Onset   Cerebral aneurysm Mother    Diabetes Mother    COPD Father    Bone cancer Father    Lung cancer Father    Lymphoma Sister     SOCIAL HISTORY: Social History   Socioeconomic History   Marital status: Married    Spouse name: Not on file   Number of children: Not on file   Years of education: Not on file    Highest education level: Not on file  Occupational History   Not on file  Tobacco Use   Smoking status: Never   Smokeless tobacco: Never  Substance and Sexual Activity   Alcohol use: No   Drug use: No   Sexual activity: Not on file  Other Topics Concern   Not on file  Social History Narrative   Not on file   Social Determinants of Health   Financial Resource Strain: Not on file  Food Insecurity: Not on file  Transportation Needs: Not on file  Physical Activity: Not on file  Stress: Not on file  Social Connections: Not on file  Intimate Partner Violence: Not on file    Allergies  Allergen Reactions   Shellfish Allergy Shortness Of Breath and Swelling   Penicillins Other (See Comments)    Occurred as an infant; reaction unknown.    Current Outpatient Medications  Medication Sig Dispense Refill   empagliflozin (JARDIANCE) 25 MG TABS tablet Take 25 mg daily 30 tablet    glipiZIDE (GLUCOTROL XL) 10 MG 24 hr tablet Take 10 mg by mouth daily.       insulin glargine, 1 Unit Dial, (TOUJEO SOLOSTAR) 300 UNIT/ML Solostar Pen Take 30 uts  every morning     metFORMIN (GLUCOPHAGE-XR) 500 MG 24 hr tablet Take 1,000 mg by mouth 2 (  two) times daily.       metoprolol tartrate (LOPRESSOR) 25 MG tablet Take 1/2 tablet ( 12.5 mg ) twice a day 180 tablet 3   nitroGLYCERIN (NITROSTAT) 0.4 MG SL tablet Place 1 tablet (0.4 mg total) under the tongue every 5 (five) minutes as needed. (Patient taking differently: Place 0.4 mg under the tongue every 5 (five) minutes as needed for chest pain.) 25 tablet 11   rosuvastatin (CRESTOR) 40 MG tablet Take 1 tablet (40 mg total) by mouth daily. 90 tablet 3   amLODipine (NORVASC) 2.5 MG tablet Take 1 tablet (2.5 mg total) by mouth daily. 90 tablet 3   ezetimibe (ZETIA) 10 MG tablet Take 1 tablet (10 mg total) by mouth daily. 30 tablet 3   lidocaine (LIDODERM) 5 % Place 1 patch onto the skin daily. Remove & Discard patch within 12 hours or as directed by MD  (Patient not taking: Reported on 12/18/2021) 30 patch 0   rivaroxaban (XARELTO) 20 MG TABS tablet Take 1 tablet (20 mg total) by mouth daily with supper. (Patient not taking: Reported on 12/18/2021) 90 tablet 3   No current facility-administered medications for this visit.    REVIEW OF SYSTEMS:  [X]  denotes positive finding, [ ]  denotes negative finding Cardiac  Comments:  Chest pain or chest pressure:    Shortness of breath upon exertion:    Short of breath when lying flat:    Irregular heart rhythm:        Vascular    Pain in calf, thigh, or hip brought on by ambulation:    Pain in feet at night that wakes you up from your sleep:     Blood clot in your veins:    Leg swelling:         Pulmonary    Oxygen at home:    Productive cough:     Wheezing:         Neurologic    Sudden weakness in arms or legs:     Sudden numbness in arms or legs:     Sudden onset of difficulty speaking or slurred speech:    Temporary loss of vision in one eye:     Problems with dizziness:         Gastrointestinal    Blood in stool:     Vomited blood:         Genitourinary    Burning when urinating:     Blood in urine:        Psychiatric    Major depression:         Hematologic    Bleeding problems:    Problems with blood clotting too easily:        Skin    Rashes or ulcers:        Constitutional    Fever or chills:      PHYSICAL EXAM: Vitals:   12/18/21 0857  BP: (!) 176/97  Pulse: 67  Resp: 18  Temp: 98 F (36.7 C)  TempSrc: Temporal  SpO2: 98%  Weight: 235 lb (106.6 kg)  Height: 5\' 10"  (1.778 m)    GENERAL: The patient is a well-nourished male, in no acute distress. The vital signs are documented above. CARDIAC: There is a regular rate and rhythm.  VASCULAR:  Palpable femoral pulses bilaterally Monophasic signals right foot Left DP palpable Ischemic changes right fourth and fifth toe as pictured PULMONARY: No respiratory distress. ABDOMEN: Soft and non-tender.   Large umbilical hernia. MUSCULOSKELETAL: There  are no major deformities or cyanosis. NEUROLOGIC: No focal weakness or paresthesias are detected. PSYCHIATRIC: The patient has a normal affect.     DATA:   ABIs today are 0.9 on the right monophasic and 1.12 on the left triphasic  Assessment/Plan:  66 year old male with multiple risk factors that presents with ischemic changes to the right fourth and fifth toes progressing over the last 3 weeks following stumping his toes.  His ABIs today on the right are 0.9 with abnormal monophasic waveform.  I cannot palpate any pedal pulses on the right and he has easily palpable dorsalis pedis on the left.  I have recommended aortogram with lower extremity arteriogram and possible intervention if he has any endovascular options with a focus on the right leg.  Discussed this is for limb salvage.  If he has no endovascular options, may ultimately require surgical bypass but we will evaluate his runoff.  Risk benefits discussed.  We will get him scheduled for Thursday in the Cath Lab.  All questions answered.   Marty Heck, MD [facility] Office: [phone]

## 2021-12-19 ENCOUNTER — Encounter: Payer: Medicare Other | Admitting: Vascular Surgery

## 2021-12-19 ENCOUNTER — Encounter (HOSPITAL_COMMUNITY): Payer: Medicare Other

## 2021-12-20 ENCOUNTER — Ambulatory Visit (HOSPITAL_COMMUNITY)
Admission: RE | Admit: 2021-12-20 | Discharge: 2021-12-20 | Disposition: A | Discharge status: Home / Self Care | Payer: Medicare Other | Attending: Vascular Surgery | Admitting: Vascular Surgery

## 2021-12-20 ENCOUNTER — Encounter (HOSPITAL_COMMUNITY): Payer: Self-pay | Admitting: Vascular Surgery

## 2021-12-20 ENCOUNTER — Encounter (HOSPITAL_COMMUNITY)
Admission: RE | Disposition: A | Discharge status: Home / Self Care | Payer: Self-pay | Source: Home / Self Care | Attending: Vascular Surgery

## 2021-12-20 DIAGNOSIS — L03115 Cellulitis of right lower limb: Secondary | ICD-10-CM

## 2021-12-20 DIAGNOSIS — I70211 Atherosclerosis of native arteries of extremities with intermittent claudication, right leg: Secondary | ICD-10-CM | POA: Insufficient documentation

## 2021-12-20 DIAGNOSIS — I4891 Unspecified atrial fibrillation: Secondary | ICD-10-CM | POA: Insufficient documentation

## 2021-12-20 DIAGNOSIS — I2510 Atherosclerotic heart disease of native coronary artery without angina pectoris: Secondary | ICD-10-CM | POA: Diagnosis not present

## 2021-12-20 DIAGNOSIS — E1151 Type 2 diabetes mellitus with diabetic peripheral angiopathy without gangrene: Secondary | ICD-10-CM | POA: Diagnosis not present

## 2021-12-20 DIAGNOSIS — I10 Essential (primary) hypertension: Secondary | ICD-10-CM | POA: Diagnosis not present

## 2021-12-20 DIAGNOSIS — Z7984 Long term (current) use of oral hypoglycemic drugs: Secondary | ICD-10-CM | POA: Insufficient documentation

## 2021-12-20 DIAGNOSIS — Z794 Long term (current) use of insulin: Secondary | ICD-10-CM | POA: Insufficient documentation

## 2021-12-20 DIAGNOSIS — I70221 Atherosclerosis of native arteries of extremities with rest pain, right leg: Secondary | ICD-10-CM

## 2021-12-20 HISTORY — PX: ABDOMINAL AORTOGRAM W/LOWER EXTREMITY: CATH118223

## 2021-12-20 HISTORY — PX: PERIPHERAL VASCULAR INTERVENTION: CATH118257

## 2021-12-20 LAB — GLUCOSE, CAPILLARY
Glucose-Capillary: 410 mg/dL — ABNORMAL HIGH (ref 70–99)
Glucose-Capillary: 413 mg/dL — ABNORMAL HIGH (ref 70–99)
Glucose-Capillary: 300 mg/dL — ABNORMAL HIGH (ref 70–99)

## 2021-12-20 LAB — POCT ACTIVATED CLOTTING TIME: Activated Clotting Time: 245 seconds

## 2021-12-20 LAB — POCT I-STAT, CHEM 8
BUN: 22 mg/dL (ref 8–23)
Calcium, Ion: 1.32 mmol/L (ref 1.15–1.40)
Chloride: 95 mmol/L — ABNORMAL LOW (ref 98–111)
Creatinine, Ser: 0.5 mg/dL — ABNORMAL LOW (ref 0.61–1.24)
Glucose, Bld: 445 mg/dL — ABNORMAL HIGH (ref 70–99)
HCT: 48 % (ref 39.0–52.0)
Hemoglobin: 16.3 g/dL (ref 13.0–17.0)
Potassium: 4.7 mmol/L (ref 3.5–5.1)
Sodium: 129 mmol/L — ABNORMAL LOW (ref 135–145)
TCO2: 25 mmol/L (ref 22–32)

## 2021-12-20 SURGERY — ABDOMINAL AORTOGRAM W/LOWER EXTREMITY
Anesthesia: LOCAL

## 2021-12-20 MED ORDER — MIDAZOLAM HCL 2 MG/2ML IJ SOLN
INTRAMUSCULAR | Status: AC
  Filled 2021-12-20: qty 2

## 2021-12-20 MED ORDER — CLOPIDOGREL BISULFATE 75 MG PO TABS: 75.0000 mg | ORAL_TABLET | Freq: Every day | ORAL | Status: DC

## 2021-12-20 MED ORDER — HYDRALAZINE HCL 20 MG/ML IJ SOLN: 5.0000 mg | INTRAMUSCULAR | Status: DC | PRN

## 2021-12-20 MED ORDER — CLOPIDOGREL BISULFATE 300 MG PO TABS
ORAL_TABLET | ORAL | Status: AC
  Filled 2021-12-20: qty 1

## 2021-12-20 MED ORDER — FENTANYL CITRATE (PF) 100 MCG/2ML IJ SOLN
INTRAMUSCULAR | Status: DC | PRN
Start: 2021-12-20 — End: 2021-12-20
  Administered 2021-12-20 (×2): 50 ug via INTRAVENOUS

## 2021-12-20 MED ORDER — SODIUM CHLORIDE 0.9 % IV SOLN: 250.0000 mL | INTRAVENOUS | Status: DC | PRN

## 2021-12-20 MED ORDER — HEPARIN SODIUM (PORCINE) 1000 UNIT/ML IJ SOLN
INTRAMUSCULAR | Status: AC
  Filled 2021-12-20: qty 10

## 2021-12-20 MED ORDER — CLOPIDOGREL BISULFATE 75 MG PO TABS: 300.0000 mg | ORAL_TABLET | Freq: Once | ORAL | Status: DC

## 2021-12-20 MED ORDER — CLOPIDOGREL BISULFATE 75 MG PO TABS: 75.0000 mg | ORAL_TABLET | Freq: Every day | ORAL | 11 refills | Status: AC

## 2021-12-20 MED ORDER — HEPARIN SODIUM (PORCINE) 1000 UNIT/ML IJ SOLN
INTRAMUSCULAR | Status: DC | PRN
  Administered 2021-12-20: 10000 [IU] via INTRAVENOUS

## 2021-12-20 MED ORDER — ACETAMINOPHEN 325 MG PO TABS: 650.0000 mg | ORAL_TABLET | ORAL | Status: DC | PRN

## 2021-12-20 MED ORDER — LIDOCAINE HCL (PF) 1 % IJ SOLN
INTRAMUSCULAR | Status: AC
  Filled 2021-12-20: qty 30

## 2021-12-20 MED ORDER — FENTANYL CITRATE (PF) 100 MCG/2ML IJ SOLN
INTRAMUSCULAR | Status: AC
  Filled 2021-12-20: qty 2

## 2021-12-20 MED ORDER — SODIUM CHLORIDE 0.9% FLUSH: 3.0000 mL | Freq: Two times a day (BID) | INTRAVENOUS | Status: DC

## 2021-12-20 MED ORDER — MIDAZOLAM HCL 2 MG/2ML IJ SOLN
INTRAMUSCULAR | Status: DC | PRN
  Administered 2021-12-20: 2 mg via INTRAVENOUS

## 2021-12-20 MED ORDER — SODIUM CHLORIDE 0.9 % IV SOLN
INTRAVENOUS | Status: DC
Start: 2021-12-20 — End: 2021-12-20

## 2021-12-20 MED ORDER — INSULIN ASPART 100 UNIT/ML IJ SOLN
15.0000 [IU] | Freq: Once | INTRAMUSCULAR | Status: AC
  Administered 2021-12-20: 15 [IU] via SUBCUTANEOUS
  Filled 2021-12-20: qty 1

## 2021-12-20 MED ORDER — LIDOCAINE HCL (PF) 1 % IJ SOLN
INTRAMUSCULAR | Status: DC | PRN
  Administered 2021-12-20: 12 mL

## 2021-12-20 MED ORDER — ONDANSETRON HCL 4 MG/2ML IJ SOLN: 4.0000 mg | Freq: Four times a day (QID) | INTRAMUSCULAR | Status: DC | PRN

## 2021-12-20 MED ORDER — SODIUM CHLORIDE 0.9% FLUSH: 3.0000 mL | INTRAVENOUS | Status: DC | PRN

## 2021-12-20 MED ORDER — LABETALOL HCL 5 MG/ML IV SOLN: 10.0000 mg | INTRAVENOUS | Status: DC | PRN

## 2021-12-20 MED ORDER — SODIUM CHLORIDE 0.9 % IV SOLN: INTRAVENOUS | Status: DC

## 2021-12-20 MED ORDER — HEPARIN (PORCINE) IN NACL 1000-0.9 UT/500ML-% IV SOLN
INTRAVENOUS | Status: AC
  Filled 2021-12-20: qty 1000

## 2021-12-20 MED ORDER — HEPARIN (PORCINE) IN NACL 1000-0.9 UT/500ML-% IV SOLN
INTRAVENOUS | Status: DC | PRN
  Administered 2021-12-20 (×2): 500 mL

## 2021-12-20 MED ORDER — CLOPIDOGREL BISULFATE 300 MG PO TABS
ORAL_TABLET | ORAL | Status: DC | PRN
  Administered 2021-12-20: 300 mg via ORAL

## 2021-12-20 MED ORDER — IODIXANOL 320 MG/ML IV SOLN
INTRAVENOUS | Status: DC | PRN
  Administered 2021-12-20: 135 mL

## 2021-12-20 SURGICAL SUPPLY — 19 items
BALLN MUSTANG 6.0X40 135 (BALLOONS) ×3
BALLOON MUSTANG 6.0X40 135 (BALLOONS) IMPLANT
CATH OMNI FLUSH 5F 65CM (CATHETERS) ×1 IMPLANT
CATH QUICKCROSS .035X135CM (MICROCATHETER) ×1 IMPLANT
DEVICE CLOSURE MYNXGRIP 6/7F (Vascular Products) ×1 IMPLANT
GLIDEWIRE ADV .035X260CM (WIRE) ×1 IMPLANT
KIT ENCORE 26 ADVANTAGE (KITS) ×1 IMPLANT
KIT MICROPUNCTURE NIT STIFF (SHEATH) ×1 IMPLANT
KIT PV (KITS) ×3 IMPLANT
MAT PREVALON FULL STRYKER (MISCELLANEOUS) ×1 IMPLANT
SHEATH FLEX ANSEL ST 6FR 45CM (SHEATH) ×1 IMPLANT
SHEATH PINNACLE 5F 10CM (SHEATH) ×1 IMPLANT
SHEATH PINNACLE 6F 10CM (SHEATH) ×1 IMPLANT
SHEATH PROBE COVER 6X72 (BAG) ×1 IMPLANT
STENT ELUVIA 7X40X130 (Permanent Stent) ×1 IMPLANT
SYR MEDRAD MARK V 150ML (SYRINGE) ×1 IMPLANT
TRANSDUCER W/STOPCOCK (MISCELLANEOUS) ×3 IMPLANT
TRAY PV CATH (CUSTOM PROCEDURE TRAY) ×3 IMPLANT
WIRE BENTSON .035X145CM (WIRE) ×1 IMPLANT

## 2021-12-20 NOTE — Op Note (Signed)
Patient name: Chad Maddox MRN: [id number] DOB: 24-Aug-1955 Sex: male  12/20/2021 Pre-operative Diagnosis: Ischemic toes right foot with evidence of PAD Post-operative diagnosis:  Same Surgeon:  Cephus Shelling, MD Procedure Performed: 1.  Ultrasound-guided access left common femoral artery 2.  Aortogram including catheter selection of aorta 3.  Bilateral lower extremity arteriogram with selection of third order branches in the right lower extremity 4.  Right distal SFA angioplasty with stent placement (stented with a 7 mm x 40 mm Eluvia postdilated with a 6 mm x 40 mm Mustang) 5.  Mynx closure of the left common femoral artery 6.  50 minutes of monitored moderate conscious sedation time  Indications: 66 year old male seen in the office on Tuesday as a referral from Dr. Lajoyce Corners with ischemic toes on the right foot after stumping his toes about 3 weeks ago.  He presents today for aortogram, lower extremity arteriogram, with possible intervention after risks benefits discussed.  Findings:   Aortogram showed patent renal arteries bilaterally with a widely patent infrarenal aorta and patent common and external iliac arteries bilaterally.  Right lower extremity arteriogram showed a patent common femoral and profunda with a widely patent SFA except for the distal SFA that had a focal high-grade calcified stenosis greater than 95%.  Distal to the lesion widely patent popliteal artery with three-vessel runoff.  Left lower extremity arteriogram showed a patent common femoral and profunda with a widely patent SFA and patent above and below-knee popliteal artery and three-vessel runoff.  The right SFA stenosis was crossed from left transfemoral access.  This was primarily stented with a 7 mm x 40 mm drug-coated Eluvia postdilated with a 6 mm x 40 mm Mustang.  Widely patent stent with no residual stenosis.  Preserved three-vessel runoff distally.   Procedure:  The patient was identified in the  holding area and taken to room 8.  The patient was then placed supine on the table and prepped and draped in the usual sterile fashion.  A time out was called.  Patient received Versed and fentanyl for moderate sedation.  Vital signs were monitored including oxygenation, heart rate, blood pressure and respiratory rate.  I was present for all moderate sedation.  Ultrasound was used to evaluate the left common femoral artery.  It was patent .  A digital ultrasound image was acquired.  A micropuncture needle was used to access the left common femoral artery under ultrasound guidance.  An 018 wire was advanced without resistance and a micropuncture sheath was placed.  The 018 wire was removed and a benson wire was placed.  The micropuncture sheath was exchanged for a 5 french sheath.  An omniflush catheter was advanced over the wire to the level of L-1.  An abdominal angiogram was obtained.  We then pulled the catheter down and shot bilateral lower extremity runoff.  Pertinent findings are noted above.  Ultimately elected intervene on the right SFA high-grade stenosis.  A Glidewire advantage was then placed over the aortic bifurcation into the right SFA with the omni flush catheter.  Exchanged for a long 6 [place] Ansell sheath in the left groin over the aortic bifurcation.  Patient was given 100 units/kg IV heparin.  Then used a quick cross catheter with a Glidewire advantage to cross the right distal SFA high-grade stenosis and got our wire into the popliteal artery.  The right distal SFA stenosis was primarily stented with a 7 mm x 40 mm drug-coated Eluvia and then post-dilated with a  6 mm x 40 mm Mustang.  Widely patent stent at completion with preserved runoff distally.  Wires and catheters were removed and a short 6 French sheath was placed in the left groin.  Mynx closure device was deployed.  Taken to holding in stable condition.  Plan: Patient will be loaded on Plavix and can restart his Xarelto tomorrow.  I  will see him in 1 month with right leg arterial duplex and ABIs.  Excellent results today.   Cephus Shelling, MD [facility] Office: [phone]

## 2021-12-20 NOTE — Discharge Instructions (Signed)
Femoral Site Care This sheet gives you information about how to care for yourself after your procedure. Your health care provider may also give you more specific instructions. If you have problems or questions, contact your health care provider. What can I expect after the procedure?  After the procedure, it is common to have: Bruising that usually fades within 1-2 weeks. Tenderness at the site. Follow these instructions at home: Wound care Follow instructions from your health care provider about how to take care of your insertion site. Make sure you: Wash your hands with soap and water before you change your bandage (dressing). If soap and water are not available, use hand sanitizer. Remove your dressing as told by your health care provider. 24 hours Do not take baths, swim, or use a hot tub until your health care provider approves. You may shower 24-48 hours after the procedure or as told by your health care provider. Gently wash the site with plain soap and water. Pat the area dry with a clean towel. Do not rub the site. This may cause bleeding. Do not apply powder or lotion to the site. Keep the site clean and dry. Check your femoral site every day for signs of infection. Check for: Redness, swelling, or pain. Fluid or blood. Warmth. Pus or a bad smell. Activity For the first 2-3 days after your procedure, or as long as directed: Avoid climbing stairs as much as possible. Do not squat. Do not lift anything that is heavier than 10 lb (4.5 kg), or the limit that you are told, until your health care provider says that it is safe. For 5 days Rest as directed. Avoid sitting for a long time without moving. Get up to take short walks every 1-2 hours. Do not drive for 24 hours if you were given a medicine to help you relax (sedative). General instructions Take over-the-counter and prescription medicines only as told by your health care provider. Keep all follow-up visits as told by your  health care provider. This is important. Contact a health care provider if you have: A fever or chills. You have redness, swelling, or pain around your insertion site. Get help right away if: The catheter insertion area swells very fast. You pass out. You suddenly start to sweat or your skin gets clammy. The catheter insertion area is bleeding, and the bleeding does not stop when you hold steady pressure on the area. The area near or just beyond the catheter insertion site becomes pale, cool, tingly, or numb. These symptoms may represent a serious problem that is an emergency. Do not wait to see if the symptoms will go away. Get medical help right away. Call your local emergency services (911 in the [place] Do not drive yourself to the hospital. Summary After the procedure, it is common to have bruising that usually fades within 1-2 weeks. Check your femoral site every day for signs of infection. Do not lift anything that is heavier than 10 lb (4.5 kg), or the limit that you are told, until your health care provider says that it is safe. This information is not intended to replace advice given to you by your health care provider. Make sure you discuss any questions you have with your health care provider. Document Revised: 07/21/2017 Document Reviewed: 07/21/2017 Elsevier Patient Education  2020 [employer]  

## 2021-12-20 NOTE — H&P (Signed)
History and Physical Interval Note:  12/20/2021 8:12 AM  Page Spiro  has presented today for surgery, with the diagnosis of CRITICAL LIMB ISCHEMIA.  The various methods of treatment have been discussed with the patient and family. After consideration of risks, benefits and other options for treatment, the patient has consented to  Procedure(s): ABDOMINAL AORTOGRAM W/LOWER EXTREMITY (N/A) as a surgical intervention.  The patient's history has been reviewed, patient examined, no change in status, stable for surgery.  I have reviewed the patient's chart and labs.  Questions were answered to the patient's satisfaction.     Marty Heck    Patient name: Chad Maddox      MRN: [id number]        DOB: [birth year]          Sex: male   REASON FOR CONSULT: Right foot ischemic toes   HPI: Chad Maddox is a 66 y.o. male, with history of coronary artery disease s/p PCI, atrial fibrillation on Xarelto, diabetes, hypertension, hyperlipidemia that presents as a referral from Dr. Sharol Given for evaluation of ischemic toes on the right foot.  He stumped his foot about 3 weeks ago and this has not healed.  He was seen by Dr. Sharol Given who then recommended referral to vascular surgery given he could only get monophasic signals in the right foot with no palpable pulses.  Patient denies any previous lower extremity vascular interventions.  He does not smoke.  States he is not taking Xarelto for his atrial fibrillation.  States he cannot see his toes.  Works as a Production assistant, radio.         Past Medical History:  Diagnosis Date   Coronary artery disease      s/p NSTEMI 01/2008  -- s/p BMS to RCA 01/2008  --  s/p XIENCE DES TO OM2 12/2008  --  OTW NONOBS DZS AND PATENT RCA STENT, EF 60%    Diabetes mellitus     GERD (gastroesophageal reflux disease)     Hyperlipidemia, mixed     Hypertension     Obesity             Past Surgical History:  Procedure Laterality Date   CARDIAC CATHETERIZATION   12/22/2008      Triple-vessel coronary artery disease -- Patent stent in the proximal right coronary artery -- Severe stenosis of the second obtuse marginal branch -- Successful percutaneous coronary intervention with placement of a Xience drug-eluting stent in the second obtuse marginal branch.  -- Normal left ventricular systolic function           Family History  Problem Relation Age of Onset   Cerebral aneurysm Mother     Diabetes Mother     COPD Father     Bone cancer Father     Lung cancer Father     Lymphoma Sister        SOCIAL HISTORY: Social History         Socioeconomic History   Marital status: Married      Spouse name: Not on file   Number of children: Not on file   Years of education: Not on file   Highest education level: Not on file  Occupational History   Not on file  Tobacco Use   Smoking status: Never   Smokeless tobacco: Never  Substance and Sexual Activity   Alcohol use: No   Drug use: No   Sexual activity: Not on file  Other Topics Concern   Not  on file  Social History Narrative   Not on file    Social Determinants of Health    Financial Resource Strain: Not on file  Food Insecurity: Not on file  Transportation Needs: Not on file  Physical Activity: Not on file  Stress: Not on file  Social Connections: Not on file  Intimate Partner Violence: Not on file           Allergies  Allergen Reactions   Shellfish Allergy Shortness Of Breath and Swelling   Penicillins Other (See Comments)      Occurred as an infant; reaction unknown.            Current Outpatient Medications  Medication Sig Dispense Refill   empagliflozin (JARDIANCE) 25 MG TABS tablet Take 25 mg daily 30 tablet     glipiZIDE (GLUCOTROL XL) 10 MG 24 hr tablet Take 10 mg by mouth daily.         insulin glargine, 1 Unit Dial, (TOUJEO SOLOSTAR) 300 UNIT/ML Solostar Pen Take 30 uts  every morning       metFORMIN (GLUCOPHAGE-XR) 500 MG 24 hr tablet Take 1,000 mg by mouth 2 (two) times daily.          metoprolol tartrate (LOPRESSOR) 25 MG tablet Take 1/2 tablet ( 12.5 mg ) twice a day 180 tablet 3   nitroGLYCERIN (NITROSTAT) 0.4 MG SL tablet Place 1 tablet (0.4 mg total) under the tongue every 5 (five) minutes as needed. (Patient taking differently: Place 0.4 mg under the tongue every 5 (five) minutes as needed for chest pain.) 25 tablet 11   rosuvastatin (CRESTOR) 40 MG tablet Take 1 tablet (40 mg total) by mouth daily. 90 tablet 3   amLODipine (NORVASC) 2.5 MG tablet Take 1 tablet (2.5 mg total) by mouth daily. 90 tablet 3   ezetimibe (ZETIA) 10 MG tablet Take 1 tablet (10 mg total) by mouth daily. 30 tablet 3   lidocaine (LIDODERM) 5 % Place 1 patch onto the skin daily. Remove & Discard patch within 12 hours or as directed by MD (Patient not taking: Reported on 12/18/2021) 30 patch 0   rivaroxaban (XARELTO) 20 MG TABS tablet Take 1 tablet (20 mg total) by mouth daily with supper. (Patient not taking: Reported on 12/18/2021) 90 tablet 3    No current facility-administered medications for this visit.      REVIEW OF SYSTEMS:  [X]  denotes positive finding, [ ]  denotes negative finding Cardiac   Comments:  Chest pain or chest pressure:      Shortness of breath upon exertion:      Short of breath when lying flat:      Irregular heart rhythm:             Vascular      Pain in calf, thigh, or hip brought on by ambulation:      Pain in feet at night that wakes you up from your sleep:       Blood clot in your veins:      Leg swelling:              Pulmonary      Oxygen at home:      Productive cough:       Wheezing:              Neurologic      Sudden weakness in arms or legs:       Sudden numbness in arms or legs:  Sudden onset of difficulty speaking or slurred speech:      Temporary loss of vision in one eye:       Problems with dizziness:              Gastrointestinal      Blood in stool:       Vomited blood:              Genitourinary      Burning when urinating:        Blood in urine:             Psychiatric      Major depression:              Hematologic      Bleeding problems:      Problems with blood clotting too easily:             Skin      Rashes or ulcers:             Constitutional      Fever or chills:          PHYSICAL EXAM:    Vitals:    12/18/21 0857  BP: (!) 176/97  Pulse: 67  Resp: 18  Temp: 98 F (36.7 C)  TempSrc: Temporal  SpO2: 98%  Weight: 235 lb (106.6 kg)  Height: 5\' 10"  (1.778 m)      GENERAL: The patient is a well-nourished male, in no acute distress. The vital signs are documented above. CARDIAC: There is a regular rate and rhythm.  VASCULAR:  Palpable femoral pulses bilaterally Monophasic signals right foot Left DP palpable Ischemic changes right fourth and fifth toe as pictured PULMONARY: No respiratory distress. ABDOMEN: Soft and non-tender.  Large umbilical hernia. MUSCULOSKELETAL: There are no major deformities or cyanosis. NEUROLOGIC: No focal weakness or paresthesias are detected. PSYCHIATRIC: The patient has a normal affect.       DATA:    ABIs today are 0.9 on the right monophasic and 1.12 on the left triphasic   Assessment/Plan:   66 year old male with multiple risk factors that presents with ischemic changes to the right fourth and fifth toes progressing over the last 3 weeks following stumping his toes.  His ABIs today on the right are 0.9 with abnormal monophasic waveform.  I cannot palpate any pedal pulses on the right and he has easily palpable dorsalis pedis on the left.  I have recommended aortogram with lower extremity arteriogram and possible intervention if he has any endovascular options with a focus on the right leg.  Discussed this is for limb salvage.  If he has no endovascular options, may ultimately require surgical bypass but we will evaluate his runoff.  Risk benefits discussed.  We will get him scheduled for Thursday in the Cath Lab.  All questions answered.      Marty Heck, MD [facility] Office: [phone]

## 2021-12-25 ENCOUNTER — Telehealth: Payer: Self-pay

## 2021-12-25 ENCOUNTER — Telehealth: Payer: Self-pay | Admitting: *Deleted

## 2021-12-25 NOTE — Telephone Encounter (Signed)
Pt is at [facility] facility in [place] right now. They are going to transfer him to [facility].

## 2021-12-25 NOTE — Telephone Encounter (Signed)
Called pt, rang, them prompt says that call cannot be completed as dialed.

## 2021-12-25 NOTE — Telephone Encounter (Signed)
Refer to previous note.

## 2021-12-25 NOTE — Telephone Encounter (Signed)
Patient left voicemail on triage phone that he has pus on his toe I tried calling back, it goes straight to voice mail that is full

## 2021-12-25 NOTE — Telephone Encounter (Signed)
Patient called c/o extreme bruising measuring "18"x4" area".  Also states under each arm he has a rash.  His extreme toe pain will be addressed by Dr Lajoyce Corners.  An appointment was scheduled Wednesday, June

## 2021-12-26 ENCOUNTER — Ambulatory Visit: Payer: Medicare Other

## 2021-12-29 ENCOUNTER — Other Ambulatory Visit: Payer: Self-pay | Admitting: General Practice

## 2021-12-31 ENCOUNTER — Other Ambulatory Visit: Payer: Self-pay

## 2021-12-31 ENCOUNTER — Telehealth: Payer: Self-pay

## 2021-12-31 MED ORDER — EZETIMIBE 10 MG PO TABS: 10.0000 mg | ORAL_TABLET | Freq: Every day | ORAL | 1 refills | Status: DC

## 2021-12-31 NOTE — Telephone Encounter (Signed)
Pt called stating that he had called this office approx 34 times, with no return calls. After speaking with the pt, it became apparent that he had been trying to reach Dr Audrie Lia office instead. He has gotten in touch with them and a f/u appt has been made for [place] and for [employer]. Pt confirmed understanding.

## 2022-01-03 ENCOUNTER — Ambulatory Visit: Payer: Medicare Other | Admitting: Orthopedic Surgery

## 2022-01-03 DIAGNOSIS — I739 Peripheral vascular disease, unspecified: Secondary | ICD-10-CM

## 2022-01-03 DIAGNOSIS — S98131A Complete traumatic amputation of one right lesser toe, initial encounter: Secondary | ICD-10-CM

## 2022-01-03 DIAGNOSIS — Z89421 Acquired absence of other right toe(s): Secondary | ICD-10-CM

## 2022-01-03 MED ORDER — HYDROCODONE-ACETAMINOPHEN 7.5-325 MG PO TABS: 1.0000 | ORAL_TABLET | Freq: Four times a day (QID) | ORAL | 0 refills | Status: DC | PRN

## 2022-01-06 ENCOUNTER — Encounter: Payer: Self-pay | Admitting: Orthopedic Surgery

## 2022-01-06 NOTE — Progress Notes (Signed)
Office Visit Note   Patient: Chad Maddox           Date of Birth: 1956/05/21           MRN: [id number] Visit Date: 01/03/2022              Requested by: No referring provider defined for this encounter. PCP: Pcp, No  Chief Complaint  Patient presents with   Right Foot - Pain    Surgery with vascular at [facility] 12/27/2021 right 4th and 5th toe amputation       HPI: Patient is a 66 year old gentleman who is 1 week status post right foot fourth and fifth toe amputations.  Patient has undergone endovascular reconstruction.  Assessment & Plan: Visit Diagnoses:  1. PVD (peripheral vascular disease) (HCC)   2. Amputated toe of right foot (HCC)     Plan: Start Dial soap cleansing dry dressing changes nonweightbearing.  Follow-Up Instructions: Return in about 4 weeks (around 01/31/2022).   Ortho Exam  Patient is alert, oriented, no adenopathy, well-dressed, normal affect, normal respiratory effort. Examination patient has venous stasis swelling with weeping edema he has a palpable pulse.  There is maceration around the surgical incision of the amputated fourth and fifth toes.  Imaging: No results found. No images are attached to the encounter.  Labs: Lab Results  Component Value Date   HGBA1C 11.1 (H) 03/15/2021   HGBA1C 12.2 (H) 02/24/2020   HGBA1C 11.0 (H) 11/29/2019   REPTSTATUS 12/22/2019 FINAL 12/17/2019   REPTSTATUS 12/22/2019 FINAL 12/17/2019   CULT  12/17/2019    NO GROWTH 5 DAYS Performed at [facility], [address], [place], [place] [address]    CULT  12/17/2019    NO GROWTH 5 DAYS Performed at [facility], [address], [place], [place] [address]    LABORGA GROUP B STREP(S.AGALACTIAE)ISOLATED 12/15/2019     Lab Results  Component Value Date   ALBUMIN 5.0 (H) 04/16/2021   ALBUMIN 4.5 03/15/2021   ALBUMIN 3.9 12/15/2019    Lab Results  Component Value Date   MG 1.6 (L) 12/16/2019   MG 1.6 (L) 12/16/2019   MG 2.0  01/19/2008   No results found for: "VD25OH"  No results found for: "PREALBUMIN"    Latest Ref Rng & Units 12/20/2021    7:39 AM 05/22/2021    3:59 AM 03/15/2021    8:58 AM  CBC EXTENDED  WBC 4.0 - 10.5 K/uL  11.6  10.2   RBC 4.22 - 5.81 MIL/uL  6.79  6.75   Hemoglobin 13.0 - 17.0 g/dL 92.3  30.0  76.2   HCT 39.0 - 52.0 % 48.0  53.1  53.3   Platelets 150 - 400 K/uL  201  230   NEUT# 1.4 - 7.0 x10E3/uL   6.5   Lymph# 0.7 - 3.1 x10E3/uL   2.2      There is no height or weight on file to calculate BMI.  Orders:  No orders of the defined types were placed in this encounter.  Meds ordered this encounter  Medications   HYDROcodone-acetaminophen (NORCO) 7.5-325 MG tablet    Sig: Take 1 tablet by mouth every 6 (six) hours as needed for moderate pain.    Dispense:  15 tablet    Refill:  0     Procedures: No procedures performed  Clinical Data: No additional findings.  ROS:  All other systems negative, except as noted in the HPI. Review of Systems  Objective: Vital Signs: There were no vitals taken for this visit.  Specialty Comments:  No specialty comments available.  PMFS History: Patient Active Problem List   Diagnosis Date Noted   Critical limb ischemia of right lower extremity (HCC) 12/18/2021   Pain in left wrist 04/16/2021   Sepsis (HCC) 12/15/2019   Cellulitis of right foot 12/15/2019   Atrial fibrillation with RVR (HCC) 12/15/2019   Hyperlipidemia 04/19/2016   HYPERTENSION, BENIGN 07/12/2010   CAD, NATIVE VESSEL 06/23/2009   Diabetes mellitus type 2 in obese (HCC) 10/28/2008   OBESITY 10/28/2008   LEUKOCYTOSIS 10/28/2008   GERD 10/28/2008   Past Medical History:  Diagnosis Date   Coronary artery disease    s/p NSTEMI 01/2008  -- s/p BMS to RCA 01/2008  --  s/p XIENCE DES TO OM2 12/2008  --  OTW NONOBS DZS AND PATENT RCA STENT, EF 60%    Diabetes mellitus    GERD (gastroesophageal reflux disease)    Hyperlipidemia, mixed    Hypertension    Obesity      Family History  Problem Relation Age of Onset   Cerebral aneurysm Mother    Diabetes Mother    COPD Father    Bone cancer Father    Lung cancer Father    Lymphoma Sister     Past Surgical History:  Procedure Laterality Date   ABDOMINAL AORTOGRAM W/LOWER EXTREMITY N/A 12/20/2021   Procedure: ABDOMINAL AORTOGRAM W/LOWER EXTREMITY;  Surgeon: Cephus Shelling, MD;  Location: [facility] INVASIVE CV LAB;  Service: Cardiovascular;  Laterality: N/A;   CARDIAC CATHETERIZATION  12/22/2008    Triple-vessel coronary artery disease -- Patent stent in the proximal right coronary artery -- Severe stenosis of the second obtuse marginal branch -- Successful percutaneous coronary intervention with placement of a Xience drug-eluting stent in the second obtuse marginal branch.  -- Normal left ventricular systolic function   PERIPHERAL VASCULAR INTERVENTION  12/20/2021   Procedure: PERIPHERAL VASCULAR INTERVENTION;  Surgeon: Cephus Shelling, MD;  Location: [facility] INVASIVE CV LAB;  Service: Cardiovascular;;  Right SFA   Social History   Occupational History   Not on file  Tobacco Use   Smoking status: Never   Smokeless tobacco: Never  Substance and Sexual Activity   Alcohol use: No   Drug use: No   Sexual activity: Not on file

## 2022-01-11 ENCOUNTER — Other Ambulatory Visit: Payer: Self-pay | Admitting: *Deleted

## 2022-01-11 DIAGNOSIS — I70221 Atherosclerosis of native arteries of extremities with rest pain, right leg: Secondary | ICD-10-CM

## 2022-01-24 HISTORY — PX: TOE SURGERY: SHX1073

## 2022-01-29 ENCOUNTER — Encounter (HOSPITAL_COMMUNITY): Payer: Medicare Other

## 2022-01-29 ENCOUNTER — Encounter: Payer: Medicare Other | Admitting: Vascular Surgery

## 2022-02-01 ENCOUNTER — Ambulatory Visit: Payer: Medicare Other | Admitting: Family

## 2022-02-05 ENCOUNTER — Telehealth: Payer: Self-pay | Admitting: Orthopedic Surgery

## 2022-02-05 NOTE — Telephone Encounter (Signed)
Pt called and need a call from Dr. Lajoyce Corners. Pt is a pt of Dr. Lajoyce Corners and really need medical advice. Please call pt at [phone].

## 2022-02-05 NOTE — Telephone Encounter (Signed)
Pt needs post op appt sch for Thursday at 9 am pt states that he is at a SNF but will arrange for his own transportation.

## 2022-02-07 ENCOUNTER — Telehealth: Payer: Self-pay | Admitting: Orthopedic Surgery

## 2022-02-07 ENCOUNTER — Encounter: Payer: Medicare Other | Admitting: Orthopedic Surgery

## 2022-02-07 NOTE — Telephone Encounter (Signed)
Patient called stating that he is in a rehab facility and will be there for 3 weeks or more and has no way to come to [place] for an appointment because the place he is staying does not offer transportation. CB # [phone]

## 2022-02-07 NOTE — Telephone Encounter (Signed)
This pt did not have surgery with Dr. Lajoyce Corners. He had surgery at [facility]. I will contact the pt and let him know that we are happy to see him when he is able to come in the office but post op care should go through operating doctor.

## 2022-02-08 NOTE — Telephone Encounter (Signed)
I called and sw pt to advise of message below. He states that he does have a post op appt with that office for next week. Advised that we are always happy to see him for any concerns tht he has and he advised tht he will call once he has been d/c from SNF if needed.

## 2022-02-26 ENCOUNTER — Encounter (HOSPITAL_COMMUNITY): Payer: Medicare Other

## 2022-02-26 ENCOUNTER — Encounter: Payer: Medicare Other | Admitting: Vascular Surgery

## 2022-03-17 ENCOUNTER — Other Ambulatory Visit: Payer: Self-pay | Admitting: Cardiology

## 2022-03-18 NOTE — Telephone Encounter (Signed)
Prescription refill request for Xarelto received.  Indication:Afib Last office visit:9/22 Weight:104.3 kg Age:66 Scr:0.6 CrCl:181.08  ml/min  Prescription refilled

## 2022-03-19 ENCOUNTER — Other Ambulatory Visit: Payer: Self-pay | Admitting: Surgery

## 2022-03-25 ENCOUNTER — Other Ambulatory Visit: Payer: Self-pay | Admitting: Cardiology

## 2022-03-27 ENCOUNTER — Other Ambulatory Visit: Payer: Self-pay | Admitting: General Practice

## 2022-04-07 ENCOUNTER — Encounter (HOSPITAL_COMMUNITY): Payer: Self-pay | Admitting: Surgery

## 2022-04-07 ENCOUNTER — Other Ambulatory Visit: Payer: Self-pay

## 2022-04-07 NOTE — Progress Notes (Signed)
PCP - Denies  Cardiologist - Dr. P. Martinique  EP- Denies  Endocrine- Denies   Pulm- Denies  Chest x-ray - 02/01/22 (E)  EKG - 04/09/22- Day of surgery  Stress Test - Denies  ECHO - 04/02/21 (E)  Cardiac Cath - 12/22/08 (E)  AICD-na PM-na LOOP-na  Nerve Stimulator- Denies  Dialysis- Denies  Sleep Study - Denies CPAP - Denies  LABS- 04/09/22: CBC, BMP  ASA- Denies PLAVIX- LD- 9/12 XARELTO- LD- 9/12  ERAS- Yes- clears until 0630  HA1C- 12/25/21(E): 15.1 Fasting Blood Sugar - 120-200 Checks Blood Sugar ___2__ times a week. Pt sts he does not own a meter, the home health agency comes in to check the levels twice a week.   Anesthesia- Yes- abnormal A1C 15.1  Pt denies having chest pain, sob, or fever during the pre-op phone call. All instructions explained to the pt, with a verbal understanding of the material including: As of today, stop taking all Aspirin (unless instructed by your doctor) and Other Aspirin containing products, Vitamins, Fish oils, and Herbal medications. Also stop all NSAIDS i.e. Advil, Ibuprofen, Motrin, Aleve, Anaprox, Naproxen, BC, Goody Powders, and all Supplements.  Pt also instructed to wear a mask and social distance if he goes out. The opportunity to ask questions was provided.

## 2022-04-08 NOTE — Progress Notes (Signed)
Anesthesia Chart Review: Same day workup  Pertinent history includes coronary artery disease, status post PCI with BMS to RCA (01/2008), s/p DES to OM2 (12/2008), paroxysmal atrial fibrillation with Xarelto, hypertension, diabetes mellitus type 2, insulin-dependent, hyperlipidemia, peripheral vascular disease.  Last seen by cardiology 04/16/2021.  At that time he was recommended to have a sleep study for further work-up of elevated pulmonary artery systolic pressure.  Patient has not yet had this done.   History of PAD.  On  12/20/2021 he underwent abdominal aortogram with lower extremity runoff due to critical limb ischemia and subsequently right distal SFA angioplasty with stent placement. He was hospitalized 6/6 - 6/9, 3 weeks after right little toe injury. He was taken to the OR 6/8 for right 4th and 5th toe resection with decreased bleeding appreciated.  Blood cultures were negative. No op cultures were sent.  He was discharged on 2 weeks of keflex and doxycycline. He presented to the ED 7/4 with worsening right 4th and 5th toe amputation sites.  He was taken to the OR 7/7 for debridement. Wound was probed to the fifth metatarsal head and bone appeared normal at resection. Op culture grew group B strep, Finegoldia magna, and 3 colonies Candida pelliculosa. Pathology at the right fifth metatarsal was consistent with acute osteomyelitis.  He was discharged on 7/14 to complete 6 weeks of ceftriaxone, Flagyl, fluconazole. He required further debridement and partial resection of the right fifth metatarsal on 8/3. The wound was closed.  He was seen in outpatient follow-up by ID at [facility] on 03/14/2022.  Per note, "He has now completed 6 weeks of ceftriaxone, Flagyl, fluconazole and has been discharged home from the facility. His foot looks great today. Home health will be changing the dressings. I encouraged him to keep his follow-ups with podiatry and I also placed a referral for a new PCP."  Insulin-dependent  diabetes mellitus type 2, last hemoglobin A1c 15.1 per records in [place].   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  Reviewed history with anesthesiologist Dr. Deatra Canter.  Advised okay to proceed barring acute status change.  Will need day of surgery labs and evaluation.  EKG 12/27/2021 (Care Everywhere): Atrial flutter with 4:1 AV conduction.  Ventricular rate 67. Cannot rule out Anterior infarct , age undetermined   TTE 01/28/22: Left Ventricle: Systolic function is normal. EF: 55-60%. Ejection  fraction measured by 3D is 57%,    Mitral Valve: There is mild to moderate regurgitation.    Tricuspid Valve: There is mild regurgitation.    Tricuspid Valve: The right ventricular systolic pressure is mildly  elevated (37-49 mmHg).    Right Ventricle: Systolic function is mildly reduced. Abnormal  tricuspid annular plane systolic excursion (TAPSE) <1.7 cm.    Aortic Valve: Mild aortic valve regurgitation.   Normal LVEF  Mild to moderate MR  Mild TR  Mildly reduced RV systolic function  Mild AR  Event monitor 03/15/2021: Atrial fibrillation with controlled response - average HR 62 bpm with range 33-113 bpm 6 pauses up to 3.7 seconds 4 brief runs NSVT  Cath and PCI 11/21/2010:  IMPRESSION:  1. Triple-vessel coronary artery disease.  2. Patent stent in the proximal right coronary artery.  3. Severe stenosis of the second obtuse marginal branch.  4. Successful percutaneous coronary intervention with placement of a      Xience drug-eluting stent in the second obtuse marginal branch.  5. Normal left ventricular systolic function.    RECOMMENDATIONS:  I recommend continue  aspirin and Plavix for at least  the next year.  The patient will also be continued on his statin and his  beta blocker.  He will be monitored closely in the telemetry unit  tonight and will be discharged home tomorrow if he is stable.    Wynonia Musty [facility]/Anesthesiology Phone [phone] 04/08/2022 12:45 PM

## 2022-04-08 NOTE — H&P (Signed)
REFERRING PHYSICIAN: Marty Heck, MD  PROVIDER: Beverlee Nims, MD  MRN: [id number] DOB: Mar 15, 1956 DATE OF ENCOUNTER: 03/19/2022 Subjective   Chief Complaint: New Consultation (Umbilical Hernia )   History of Present Illness: Chad Maddox is a 66 y.o. male who is seen today as an office consultation for evaluation of New Consultation (Umbilical Hernia ) .   This gentleman is referred here for an umbilical hernia. He is a poor historian. He has had significant orthopedic and vascular issues. He reports having developed the umbilical hernia over a year ago. He reports developing a wound on the skin over the hernia which is causing discomfort. He has had no obstructive symptoms. He has recently had toe amputations and stents placed in his leg secondary to peripheral vascular disease  Review of Systems: A complete review of systems was obtained from the patient. I have reviewed this information and discussed as appropriate with the patient. See HPI as well for other ROS.  ROS   Medical History: Past Medical History:  Diagnosis Date  Arthritis  Diabetes mellitus without complication (CMS-HCC)  Hyperlipidemia  Hypertension   Patient Active Problem List  Diagnosis  Atrial fibrillation with RVR (CMS-HCC)  Cellulitis of right foot  Coronary atherosclerosis of native coronary artery  Critical limb ischemia of right lower extremity (CMS-HCC)  Esophageal reflux  HLD (hyperlipidemia)  HTN (hypertension)  Leukocytosis  Obesity  Pain in left wrist  Sepsis (CMS-HCC)  Diabetes mellitus type 2 in obese (CMS-HCC)   Past Surgical History:  Procedure Laterality Date  Toe Surgery Right 01/03/2022  # 4 & 5 toes removed    Allergies  Allergen Reactions  Acetaminophen Nausea And Vomiting  Penicillins Other (See Comments)  Occurred as an infant; reaction unknown.   Current Outpatient Medications on File Prior to Visit  Medication Sig Dispense Refill   cefTRIAXone (ROCEPHIN) injection Inject 1 g into the vein daily Pharmacy to determine diluent, concentration, and rate of administration.  clopidogreL (PLAVIX) 75 mg tablet Take by mouth  coenzyme Q10 10 mg capsule Take 1 tablet by mouth once daily  CONCENTRATED insulin glargine (TOUJEO SOLOSTAR U-300 INSULIN) pen injector (concentration 300 units/mL) Inject subcutaneously  ezetimibe (ZETIA) 10 mg tablet Take by mouth  FUROsemide (LASIX) 20 MG tablet Take by mouth  gabapentin (NEURONTIN) 100 MG capsule Take by mouth  insulin LISPRO (ADMELOG, HUMALOG) injection (concentration 100 units/mL) Inject subcutaneously  metoprolol tartrate (LOPRESSOR) 25 MG tablet Take by mouth  oxyCODONE (ROXICODONE) 5 MG immediate release tablet Take 5 mg by mouth every 4 (four) hours as needed for Pain  polyethylene glycol (MIRALAX) powder Take 17 g by mouth once daily Mix in 4-8ounces of fluid prior to taking.  rivaroxaban (XARELTO) 20 mg tablet Take by mouth  rosuvastatin (CRESTOR) 40 MG tablet Take by mouth   No current facility-administered medications on file prior to visit.   History reviewed. No pertinent family history.   Social History   Tobacco Use  Smoking Status Never  Smokeless Tobacco Never    Social History   Socioeconomic History  Marital status: Married  Tobacco Use  Smoking status: Never  Smokeless tobacco: Never  Substance and Sexual Activity  Alcohol use: Not Currently  Drug use: Never   Objective:   Vitals:  03/19/22 1017  BP: (!) 140/80  Pulse: 69  Temp: 36.2 C (97.2 F)  SpO2: 96%  Weight: (!) 109.1 kg (240 lb 9.6 oz)  Height: 172.7 cm (5\' 8" )   Body mass index  is 36.58 kg/m.  Physical Exam   He appears comfortable on exam  His abdomen is soft and obese.  There is a large, chronically incarcerated umbilical hernia. The fascial defect is at least 5 to 6 cm in size. There is an open wound on the skin overlying the hernia. There is no current cellulitis. There  is no abdominal tenderness  Labs, Imaging and Diagnostic Testing: I have reviewed the notes in the electronic medical records.  Assessment and Plan:   Diagnoses and all orders for this visit:  Incarcerated umbilical hernia    At this point, I discussed the diagnosis with the patient. Because the skin is eroding over the top of the chronically incarcerated hernia, urgent repair is recommended in hopes of preventing a fistula or evisceration. I explained the surgical procedure to him in detail. We discussed the risks which includes but is not limited to bleeding, infection, injury to surrounding structures, use of mesh, hernia recurrence, cardiopulmonary issues, prolonged hospitalization, etc. He understands and agrees to proceed. Surgery will be scheduled urgently

## 2022-04-08 NOTE — Anesthesia Preprocedure Evaluation (Signed)
Anesthesia Evaluation  Patient identified by MRN, date of birth, ID band Patient awake    Reviewed: Allergy & Precautions, NPO status , Patient's Chart, lab work & pertinent test results, reviewed documented beta blocker date and time   Airway Mallampati: III  TM Distance: >3 FB Neck ROM: Full    Dental  (+) Missing, Poor Dentition, Dental Advisory Given,    Pulmonary neg pulmonary ROS,    Pulmonary exam normal breath sounds clear to auscultation       Cardiovascular hypertension, Pt. on medications and Pt. on home beta blockers pulmonary hypertension (mod pHTN)+ CAD, + Cardiac Stents, + Peripheral Vascular Disease and +CHF (normal LVEF, mild reduction in RV systolic function)  Normal cardiovascular exam+ dysrhythmias (eliquis and plavix LD 1 week ago) Atrial Fibrillation + Valvular Problems/Murmurs (mild AI, mild to mod MR) AI and MR  Rhythm:Regular Rate:Normal  Off plavix and xarelto   Echo 03/2021 1. Left ventricular ejection fraction, by estimation, is 55 to 60%. The  left ventricle has normal function. The left ventricle has no regional  wall motion abnormalities. Left ventricular diastolic parameters are  indeterminate.  2. Right ventricular systolic function is mildly reduced. The right  ventricular size is mildly enlarged. There is moderately elevated  pulmonary artery systolic pressure. The estimated right ventricular  systolic pressure is 02.7 mmHg.  3. Left atrial size was mildly dilated.  4. Right atrial size was mildly dilated.  5. The mitral valve is normal in structure. Trivial mitral valve  regurgitation. No evidence of mitral stenosis.  6. The aortic valve is tricuspid. Aortic valve regurgitation is not  visualized. No aortic stenosis is present.  7. The inferior vena cava is dilated in size with >50% respiratory  variability, suggesting right atrial pressure of 8 mmHg.  8. The patient was in atrial  fibrillation.   EKG 12/27/2021 (Care Everywhere): Atrial flutter with 4:1 AV conduction.  Ventricular rate 67. Cannot rule out Anterior infarct , age undetermined   TTE 01/28/22: LeftVentricle: Systolic function is normal. EF: 55-60%. Ejection  fraction measured by 3D is 57%,  . MitralValve: There is mild to moderate regurgitation.  . TricuspidValve: There is mild regurgitation.  . TricuspidValve: The right ventricular systolic pressure is mildly  elevated (37-49 mmHg).  . RightVentricle: Systolic function is mildly reduced. Abnormal  tricuspid annular plane systolic excursion (TAPSE) <1.7 cm.  . AorticValve: Mild aortic valve regurgitation.     Event monitor 03/15/2021: . Atrial fibrillation with controlled response - average HR 62 bpm with range 33-113 bpm . 6 pauses up to 3.7 seconds . 4 brief runs NSVT   Neuro/Psych negative neurological ROS  negative psych ROS   GI/Hepatic Neg liver ROS, GERD  Controlled,  Endo/Other  diabetes, Poorly Controlled, Type 2, Insulin DependentBMI 35 Last a1c 15.1 FS 392 in preop- given 14 units short acting insulin  Renal/GU negative Renal ROS  negative genitourinary   Musculoskeletal negative musculoskeletal ROS (+)   Abdominal (+) + obese,   Peds  Hematology negative hematology ROS (+)   Anesthesia Other Findings   Reproductive/Obstetrics negative OB ROS                          Anesthesia Physical Anesthesia Plan  ASA: 4  Anesthesia Plan: General   Post-op Pain Management: Tylenol PO (pre-op)*   Induction: Intravenous  PONV Risk Score and Plan: 3 and Ondansetron, Treatment may vary due to age or medical condition and Midazolam  Airway Management Planned: Oral ETT  Additional Equipment: None  Intra-op Plan:   Post-operative Plan: Extubation in OR  Informed Consent: I have reviewed the patients History and Physical, chart, labs and discussed the procedure including the risks, benefits  and alternatives for the proposed anesthesia with the patient or authorized representative who has indicated his/her understanding and acceptance.     Dental advisory given  Plan Discussed with: CRNA  Anesthesia Plan Comments: ( )       Anesthesia Quick Evaluation

## 2022-04-09 ENCOUNTER — Other Ambulatory Visit: Payer: Self-pay

## 2022-04-09 ENCOUNTER — Ambulatory Visit (HOSPITAL_BASED_OUTPATIENT_CLINIC_OR_DEPARTMENT_OTHER): Payer: Medicare Other | Admitting: Physician Assistant

## 2022-04-09 ENCOUNTER — Encounter (HOSPITAL_COMMUNITY)
Admission: RE | Disposition: A | Discharge status: Home / Self Care | Payer: Self-pay | Source: Home / Self Care | Attending: Surgery

## 2022-04-09 ENCOUNTER — Ambulatory Visit (HOSPITAL_COMMUNITY): Payer: Medicare Other | Admitting: Physician Assistant

## 2022-04-09 ENCOUNTER — Encounter (HOSPITAL_COMMUNITY): Payer: Self-pay | Admitting: Surgery

## 2022-04-09 ENCOUNTER — Observation Stay (HOSPITAL_COMMUNITY)
Admission: RE | Admit: 2022-04-09 | Discharge: 2022-04-11 | Disposition: A | Discharge status: Home / Self Care | Payer: Medicare Other | Attending: Surgery | Admitting: Surgery

## 2022-04-09 DIAGNOSIS — E1151 Type 2 diabetes mellitus with diabetic peripheral angiopathy without gangrene: Secondary | ICD-10-CM | POA: Diagnosis not present

## 2022-04-09 DIAGNOSIS — Z7901 Long term (current) use of anticoagulants: Secondary | ICD-10-CM | POA: Insufficient documentation

## 2022-04-09 DIAGNOSIS — I2510 Atherosclerotic heart disease of native coronary artery without angina pectoris: Secondary | ICD-10-CM | POA: Insufficient documentation

## 2022-04-09 DIAGNOSIS — I480 Paroxysmal atrial fibrillation: Secondary | ICD-10-CM | POA: Insufficient documentation

## 2022-04-09 DIAGNOSIS — Z7902 Long term (current) use of antithrombotics/antiplatelets: Secondary | ICD-10-CM | POA: Insufficient documentation

## 2022-04-09 DIAGNOSIS — K420 Umbilical hernia with obstruction, without gangrene: Secondary | ICD-10-CM

## 2022-04-09 DIAGNOSIS — Z79899 Other long term (current) drug therapy: Secondary | ICD-10-CM | POA: Insufficient documentation

## 2022-04-09 DIAGNOSIS — E119 Type 2 diabetes mellitus without complications: Secondary | ICD-10-CM | POA: Insufficient documentation

## 2022-04-09 DIAGNOSIS — I10 Essential (primary) hypertension: Secondary | ICD-10-CM | POA: Diagnosis not present

## 2022-04-09 DIAGNOSIS — E1165 Type 2 diabetes mellitus with hyperglycemia: Secondary | ICD-10-CM

## 2022-04-09 DIAGNOSIS — Z794 Long term (current) use of insulin: Secondary | ICD-10-CM | POA: Diagnosis not present

## 2022-04-09 HISTORY — PX: UMBILICAL HERNIA REPAIR: SHX196

## 2022-04-09 LAB — HEMOGLOBIN A1C
Hgb A1c MFr Bld: 14.2 % — ABNORMAL HIGH (ref 4.8–5.6)
Mean Plasma Glucose: 360.84 mg/dL

## 2022-04-09 LAB — CBC
HCT: 48.2 % (ref 39.0–52.0)
Hemoglobin: 15.5 g/dL (ref 13.0–17.0)
MCH: 24.4 pg — ABNORMAL LOW (ref 26.0–34.0)
MCHC: 32.2 g/dL (ref 30.0–36.0)
MCV: 76 fL — ABNORMAL LOW (ref 80.0–100.0)
Platelets: 174 10*3/uL (ref 150–400)
RBC: 6.34 MIL/uL — ABNORMAL HIGH (ref 4.22–5.81)
RDW: 17.5 % — ABNORMAL HIGH (ref 11.5–15.5)
WBC: 11.1 10*3/uL — ABNORMAL HIGH (ref 4.0–10.5)
nRBC: 0 % (ref 0.0–0.2)

## 2022-04-09 LAB — GLUCOSE, CAPILLARY
Glucose-Capillary: 392 mg/dL — ABNORMAL HIGH (ref 70–99)
Glucose-Capillary: 318 mg/dL — ABNORMAL HIGH (ref 70–99)
Glucose-Capillary: 277 mg/dL — ABNORMAL HIGH (ref 70–99)
Glucose-Capillary: 234 mg/dL — ABNORMAL HIGH (ref 70–99)
Glucose-Capillary: 217 mg/dL — ABNORMAL HIGH (ref 70–99)
Glucose-Capillary: 290 mg/dL — ABNORMAL HIGH (ref 70–99)

## 2022-04-09 LAB — BASIC METABOLIC PANEL
Anion gap: 12 (ref 5–15)
BUN: 17 mg/dL (ref 8–23)
CO2: 21 mmol/L — ABNORMAL LOW (ref 22–32)
Calcium: 9.1 mg/dL (ref 8.9–10.3)
Chloride: 98 mmol/L (ref 98–111)
Creatinine, Ser: 0.7 mg/dL (ref 0.61–1.24)
GFR, Estimated: >60 mL/min (ref >60)
Glucose, Bld: 367 mg/dL — ABNORMAL HIGH (ref 70–99)
Potassium: 4.5 mmol/L (ref 3.5–5.1)
Sodium: 131 mmol/L — ABNORMAL LOW (ref 135–145)

## 2022-04-09 SURGERY — REPAIR, HERNIA, UMBILICAL, ADULT
Anesthesia: General

## 2022-04-09 MED ORDER — LACTATED RINGERS IV SOLN: INTRAVENOUS | Status: DC

## 2022-04-09 MED ORDER — CHLORHEXIDINE GLUCONATE 0.12 % MT SOLN: 15.0000 mL | Freq: Once | OROMUCOSAL | Status: AC

## 2022-04-09 MED ORDER — ORAL CARE MOUTH RINSE: 15.0000 mL | Freq: Once | OROMUCOSAL | Status: AC

## 2022-04-09 MED ORDER — BUPIVACAINE-EPINEPHRINE 0.25% -1:200000 IJ SOLN
INTRAMUSCULAR | Status: DC | PRN
  Administered 2022-04-09: 20 mL

## 2022-04-09 MED ORDER — FENTANYL CITRATE (PF) 250 MCG/5ML IJ SOLN
INTRAMUSCULAR | Status: AC
  Filled 2022-04-09: qty 5

## 2022-04-09 MED ORDER — ACETAMINOPHEN 500 MG PO TABS
1000.0000 mg | ORAL_TABLET | ORAL | Status: DC
  Filled 2022-04-09: qty 2

## 2022-04-09 MED ORDER — INSULIN ASPART 100 UNIT/ML IJ SOLN
0.0000 [IU] | Freq: Every day | INTRAMUSCULAR | Status: DC
  Administered 2022-04-09: 3 [IU] via SUBCUTANEOUS
  Administered 2022-04-10: 2 [IU] via SUBCUTANEOUS

## 2022-04-09 MED ORDER — ACETAMINOPHEN 10 MG/ML IV SOLN
INTRAVENOUS | Status: AC
  Filled 2022-04-09: qty 100

## 2022-04-09 MED ORDER — ACETAMINOPHEN 10 MG/ML IV SOLN
INTRAVENOUS | Status: DC | PRN
  Administered 2022-04-09: 1000 mg via INTRAVENOUS

## 2022-04-09 MED ORDER — SUCCINYLCHOLINE CHLORIDE 200 MG/10ML IV SOSY
PREFILLED_SYRINGE | INTRAVENOUS | Status: DC | PRN
  Administered 2022-04-09: 200 mg via INTRAVENOUS

## 2022-04-09 MED ORDER — PROPOFOL 10 MG/ML IV BOLUS
INTRAVENOUS | Status: AC
  Filled 2022-04-09: qty 20

## 2022-04-09 MED ORDER — HYDROMORPHONE HCL 1 MG/ML IJ SOLN
INTRAMUSCULAR | Status: AC
  Filled 2022-04-09: qty 1

## 2022-04-09 MED ORDER — ENOXAPARIN SODIUM 40 MG/0.4ML IJ SOSY: 40.0000 mg | PREFILLED_SYRINGE | INTRAMUSCULAR | Status: DC

## 2022-04-09 MED ORDER — ACETAMINOPHEN 500 MG PO TABS: 1000.0000 mg | ORAL_TABLET | Freq: Once | ORAL | Status: DC

## 2022-04-09 MED ORDER — MIDAZOLAM HCL 2 MG/2ML IJ SOLN
INTRAMUSCULAR | Status: AC
  Filled 2022-04-09: qty 2

## 2022-04-09 MED ORDER — CHLORHEXIDINE GLUCONATE CLOTH 2 % EX PADS: 6.0000 | MEDICATED_PAD | Freq: Once | CUTANEOUS | Status: DC

## 2022-04-09 MED ORDER — INSULIN ASPART 100 UNIT/ML IJ SOLN
INTRAMUSCULAR | Status: AC
  Administered 2022-04-09: 14 [IU] via SUBCUTANEOUS
  Filled 2022-04-09: qty 1

## 2022-04-09 MED ORDER — AMLODIPINE BESYLATE 2.5 MG PO TABS
2.5000 mg | ORAL_TABLET | Freq: Every day | ORAL | Status: DC
  Administered 2022-04-10 – 2022-04-11 (×2): 2.5 mg via ORAL
  Filled 2022-04-09 (×2): qty 1

## 2022-04-09 MED ORDER — OXYCODONE HCL 5 MG PO TABS
ORAL_TABLET | ORAL | Status: AC
  Filled 2022-04-09: qty 1

## 2022-04-09 MED ORDER — DIPHENHYDRAMINE HCL 12.5 MG/5ML PO ELIX: 12.5000 mg | ORAL_SOLUTION | Freq: Four times a day (QID) | ORAL | Status: DC | PRN

## 2022-04-09 MED ORDER — LIDOCAINE 2% (20 MG/ML) 5 ML SYRINGE
INTRAMUSCULAR | Status: DC | PRN
  Administered 2022-04-09: 60 mg via INTRAVENOUS

## 2022-04-09 MED ORDER — OXYCODONE HCL 5 MG/5ML PO SOLN: 5.0000 mg | Freq: Once | ORAL | Status: AC | PRN

## 2022-04-09 MED ORDER — METOPROLOL TARTRATE 12.5 MG HALF TABLET
ORAL_TABLET | ORAL | Status: AC
  Administered 2022-04-09: 12.5 mg via ORAL
  Filled 2022-04-09: qty 1

## 2022-04-09 MED ORDER — INSULIN ASPART 100 UNIT/ML IJ SOLN
0.0000 [IU] | Freq: Three times a day (TID) | INTRAMUSCULAR | Status: DC
  Administered 2022-04-09: 7 [IU] via SUBCUTANEOUS
  Administered 2022-04-10: 4 [IU] via SUBCUTANEOUS
  Administered 2022-04-10: 15 [IU] via SUBCUTANEOUS
  Administered 2022-04-10: 11 [IU] via SUBCUTANEOUS
  Administered 2022-04-11: 7 [IU] via SUBCUTANEOUS

## 2022-04-09 MED ORDER — ONDANSETRON 4 MG PO TBDP: 4.0000 mg | ORAL_TABLET | Freq: Four times a day (QID) | ORAL | Status: DC | PRN

## 2022-04-09 MED ORDER — POTASSIUM CHLORIDE IN NACL 20-0.9 MEQ/L-% IV SOLN
INTRAVENOUS | Status: DC
  Filled 2022-04-09 (×3): qty 1000

## 2022-04-09 MED ORDER — DEXMEDETOMIDINE HCL IN NACL 80 MCG/20ML IV SOLN
INTRAVENOUS | Status: DC | PRN
  Administered 2022-04-09: 20 ug via INTRAVENOUS

## 2022-04-09 MED ORDER — ROCURONIUM BROMIDE 10 MG/ML (PF) SYRINGE
PREFILLED_SYRINGE | INTRAVENOUS | Status: DC | PRN
  Administered 2022-04-09: 40 mg via INTRAVENOUS
  Administered 2022-04-09: 10 mg via INTRAVENOUS

## 2022-04-09 MED ORDER — PHENYLEPHRINE 80 MCG/ML (10ML) SYRINGE FOR IV PUSH (FOR BLOOD PRESSURE SUPPORT)
PREFILLED_SYRINGE | INTRAVENOUS | Status: DC | PRN
  Administered 2022-04-09: 120 ug via INTRAVENOUS
  Administered 2022-04-09: 80 ug via INTRAVENOUS

## 2022-04-09 MED ORDER — BUPIVACAINE-EPINEPHRINE (PF) 0.25% -1:200000 IJ SOLN
INTRAMUSCULAR | Status: AC
  Filled 2022-04-09: qty 30

## 2022-04-09 MED ORDER — ONDANSETRON HCL 4 MG/2ML IJ SOLN: 4.0000 mg | Freq: Four times a day (QID) | INTRAMUSCULAR | Status: DC | PRN

## 2022-04-09 MED ORDER — DIPHENHYDRAMINE HCL 50 MG/ML IJ SOLN: 12.5000 mg | Freq: Four times a day (QID) | INTRAMUSCULAR | Status: DC | PRN

## 2022-04-09 MED ORDER — DEXAMETHASONE SODIUM PHOSPHATE 10 MG/ML IJ SOLN
INTRAMUSCULAR | Status: AC
  Filled 2022-04-09: qty 1

## 2022-04-09 MED ORDER — HYDROMORPHONE HCL 1 MG/ML IJ SOLN
1.0000 mg | INTRAMUSCULAR | Status: DC | PRN
  Administered 2022-04-09 – 2022-04-11 (×8): 1 mg via INTRAVENOUS
  Filled 2022-04-09 (×10): qty 1

## 2022-04-09 MED ORDER — INSULIN ASPART 100 UNIT/ML IJ SOLN
0.0000 [IU] | INTRAMUSCULAR | Status: AC | PRN
  Administered 2022-04-09: 10 [IU] via SUBCUTANEOUS

## 2022-04-09 MED ORDER — TRAMADOL HCL 50 MG PO TABS
50.0000 mg | ORAL_TABLET | Freq: Four times a day (QID) | ORAL | Status: DC | PRN
  Filled 2022-04-09: qty 1

## 2022-04-09 MED ORDER — METOPROLOL TARTRATE 12.5 MG HALF TABLET
12.5000 mg | ORAL_TABLET | Freq: Two times a day (BID) | ORAL | Status: DC
  Administered 2022-04-09 – 2022-04-11 (×4): 12.5 mg via ORAL
  Filled 2022-04-09 (×4): qty 1

## 2022-04-09 MED ORDER — CIPROFLOXACIN IN D5W 400 MG/200ML IV SOLN
400.0000 mg | INTRAVENOUS | Status: DC
  Filled 2022-04-09: qty 200

## 2022-04-09 MED ORDER — 0.9 % SODIUM CHLORIDE (POUR BTL) OPTIME
TOPICAL | Status: DC | PRN
  Administered 2022-04-09: 1000 mL

## 2022-04-09 MED ORDER — METOPROLOL TARTRATE 12.5 MG HALF TABLET
12.5000 mg | ORAL_TABLET | Freq: Once | ORAL | Status: AC
  Filled 2022-04-09: qty 1

## 2022-04-09 MED ORDER — CHLORHEXIDINE GLUCONATE 0.12 % MT SOLN
OROMUCOSAL | Status: AC
  Administered 2022-04-09: 15 mL via OROMUCOSAL
  Filled 2022-04-09: qty 15

## 2022-04-09 MED ORDER — PROPOFOL 10 MG/ML IV BOLUS
INTRAVENOUS | Status: DC | PRN
  Administered 2022-04-09: 150 mg via INTRAVENOUS

## 2022-04-09 MED ORDER — AMISULPRIDE (ANTIEMETIC) 5 MG/2ML IV SOLN: 10.0000 mg | Freq: Once | INTRAVENOUS | Status: DC | PRN

## 2022-04-09 MED ORDER — ONDANSETRON HCL 4 MG/2ML IJ SOLN: 4.0000 mg | Freq: Once | INTRAMUSCULAR | Status: DC | PRN

## 2022-04-09 MED ORDER — GABAPENTIN 100 MG PO CAPS
100.0000 mg | ORAL_CAPSULE | Freq: Two times a day (BID) | ORAL | Status: DC
  Administered 2022-04-09 – 2022-04-11 (×4): 100 mg via ORAL
  Filled 2022-04-09 (×4): qty 1

## 2022-04-09 MED ORDER — ONDANSETRON HCL 4 MG/2ML IJ SOLN
INTRAMUSCULAR | Status: DC | PRN
  Administered 2022-04-09: 4 mg via INTRAVENOUS

## 2022-04-09 MED ORDER — OXYCODONE HCL 5 MG PO TABS
5.0000 mg | ORAL_TABLET | Freq: Once | ORAL | Status: AC | PRN
  Administered 2022-04-09: 5 mg via ORAL

## 2022-04-09 MED ORDER — GLYCOPYRROLATE 0.2 MG/ML IJ SOLN
INTRAMUSCULAR | Status: DC | PRN
  Administered 2022-04-09: .2 mg via INTRAVENOUS

## 2022-04-09 MED ORDER — EZETIMIBE 10 MG PO TABS
10.0000 mg | ORAL_TABLET | Freq: Every day | ORAL | Status: DC
  Administered 2022-04-10 – 2022-04-11 (×2): 10 mg via ORAL
  Filled 2022-04-09 (×3): qty 1

## 2022-04-09 MED ORDER — HYDROMORPHONE HCL 1 MG/ML IJ SOLN
0.2500 mg | INTRAMUSCULAR | Status: DC | PRN
  Administered 2022-04-09 (×4): 0.5 mg via INTRAVENOUS

## 2022-04-09 MED ORDER — FENTANYL CITRATE (PF) 250 MCG/5ML IJ SOLN
INTRAMUSCULAR | Status: DC | PRN
  Administered 2022-04-09: 100 ug via INTRAVENOUS

## 2022-04-09 MED ORDER — OXYCODONE HCL 5 MG PO TABS
5.0000 mg | ORAL_TABLET | ORAL | Status: DC | PRN
  Administered 2022-04-09: 5 mg via ORAL
  Administered 2022-04-09: 10 mg via ORAL
  Filled 2022-04-09 (×2): qty 2

## 2022-04-09 SURGICAL SUPPLY — 39 items
BLADE CLIPPER SURG (BLADE) IMPLANT
CANISTER SUCT 3000ML PPV (MISCELLANEOUS) IMPLANT
CHLORAPREP W/TINT 26 (MISCELLANEOUS) ×1 IMPLANT
CNTNR URN SCR LID CUP LEK RST (MISCELLANEOUS) IMPLANT
CONT SPEC 4OZ STRL OR WHT (MISCELLANEOUS) ×1
COVER SURGICAL LIGHT HANDLE (MISCELLANEOUS) ×1 IMPLANT
DERMABOND ADVANCED .7 DNX12 (GAUZE/BANDAGES/DRESSINGS) ×1 IMPLANT
DRAPE LAPAROTOMY 100X72 PEDS (DRAPES) ×1 IMPLANT
DRSG OPSITE POSTOP 4X8 (GAUZE/BANDAGES/DRESSINGS) IMPLANT
ELECT REM PT RETURN 9FT ADLT (ELECTROSURGICAL) ×1
ELECTRODE REM PT RTRN 9FT ADLT (ELECTROSURGICAL) ×1 IMPLANT
GLOVE SURG SIGNA 7.5 PF LTX (GLOVE) ×1 IMPLANT
GOWN STRL REUS W/ TWL LRG LVL3 (GOWN DISPOSABLE) ×1 IMPLANT
GOWN STRL REUS W/ TWL XL LVL3 (GOWN DISPOSABLE) ×1 IMPLANT
GOWN STRL REUS W/TWL LRG LVL3 (GOWN DISPOSABLE) ×1
GOWN STRL REUS W/TWL XL LVL3 (GOWN DISPOSABLE) ×1
KIT BASIN OR (CUSTOM PROCEDURE TRAY) ×1 IMPLANT
KIT TURNOVER KIT B (KITS) ×1 IMPLANT
MESH VENTRALEX ST 2.5 CRC MED (Mesh General) IMPLANT
NDL HYPO 25GX1X1/2 BEV (NEEDLE) ×1 IMPLANT
NEEDLE HYPO 25GX1X1/2 BEV (NEEDLE) ×1 IMPLANT
NS IRRIG 1000ML POUR BTL (IV SOLUTION) ×1 IMPLANT
PACK GENERAL/GYN (CUSTOM PROCEDURE TRAY) ×1 IMPLANT
PAD ARMBOARD 7.5X6 YLW CONV (MISCELLANEOUS) ×1 IMPLANT
PENCIL SMOKE EVACUATOR (MISCELLANEOUS) ×1 IMPLANT
SPIKE FLUID TRANSFER (MISCELLANEOUS) ×1 IMPLANT
STAPLER VISISTAT 35W (STAPLE) IMPLANT
SUT ETHIBOND NAB CT1 #1 30IN (SUTURE) IMPLANT
SUT MNCRL AB 4-0 PS2 18 (SUTURE) ×1 IMPLANT
SUT NOVA 0 T19/GS 22DT (SUTURE) IMPLANT
SUT NOVA NAB DX-16 0-1 5-0 T12 (SUTURE) ×1 IMPLANT
SUT SILK 2 0 SH (SUTURE) IMPLANT
SUT SILK 3 0 (SUTURE) ×1
SUT SILK 3-0 18XBRD TIE 12 (SUTURE) IMPLANT
SUT VIC AB 3-0 SH 27 (SUTURE) ×1
SUT VIC AB 3-0 SH 27X BRD (SUTURE) ×1 IMPLANT
SYR CONTROL 10ML LL (SYRINGE) ×1 IMPLANT
TOWEL GREEN STERILE (TOWEL DISPOSABLE) ×1 IMPLANT
TOWEL GREEN STERILE FF (TOWEL DISPOSABLE) ×1 IMPLANT

## 2022-04-09 NOTE — Transfer of Care (Signed)
Immediate Anesthesia Transfer of Care Note  Patient: Chad Maddox  Procedure(s) Performed: OPEN UMBILICAL HERNIA REPAIR WITH MESH  Patient Location: PACU  Anesthesia Type:General  Level of Consciousness: awake and alert   Airway & Oxygen Therapy: Patient Spontanous Breathing and Patient connected to nasal cannula oxygen  Post-op Assessment: Report given to RN and Post -op Vital signs reviewed and stable  Post vital signs: Reviewed and stable  Last Vitals:  Vitals Value Taken Time  BP 112/95 04/09/22 1100  Temp    Pulse 95 04/09/22 1059  Resp 17 04/09/22 1101  SpO2 95 % 04/09/22 1059  Vitals shown include unvalidated device data.  Last Pain:  Vitals:   04/09/22 0827  TempSrc:   PainSc: 8       Patients Stated Pain Goal: 3 (82/42/35 3614)  Complications: No notable events documented.

## 2022-04-09 NOTE — Anesthesia Postprocedure Evaluation (Signed)
Anesthesia Post Note  Patient: Chad Maddox  Procedure(s) Performed: OPEN UMBILICAL HERNIA REPAIR WITH MESH     Patient location during evaluation: PACU Anesthesia Type: General Level of consciousness: awake and alert, oriented and patient cooperative Pain management: pain level controlled Vital Signs Assessment: post-procedure vital signs reviewed and stable Respiratory status: spontaneous breathing, nonlabored ventilation and respiratory function stable Cardiovascular status: blood pressure returned to baseline and stable Postop Assessment: no apparent nausea or vomiting Anesthetic complications: no   No notable events documented.  Last Vitals:  Vitals:   04/09/22 1100 04/09/22 1115  BP: (!) 112/95 138/77  Pulse: 95 74  Resp: 19 18  Temp: 36.9 C   SpO2: 95% 96%    Last Pain:  Vitals:   04/09/22 1100  TempSrc:   PainSc: [place]

## 2022-04-09 NOTE — Progress Notes (Signed)
Patient's surgical incision has moderate bleeding. Called on call MD Louanna Raw. Instructed to place gauze under honeycomb dressing. Gauze placed under honeycomb, dressing secured with tape.

## 2022-04-09 NOTE — Interval H&P Note (Signed)
History and Physical Interval Note: no change in H and P  04/09/2022 7:52 AM  Page Spiro  has presented today for surgery, with the diagnosis of INCARCERATED UMBILICAL HERNIA.  The various methods of treatment have been discussed with the patient and family. After consideration of risks, benefits and other options for treatment, the patient has consented to  Procedure(s): OPEN UMBILICAL HERNIA REPAIR WITH MESH (N/A) as a surgical intervention.  The patient's history has been reviewed, patient examined, no change in status, stable for surgery.  I have reviewed the patient's chart and labs.  Questions were answered to the patient's satisfaction.     Coralie Keens

## 2022-04-09 NOTE — Op Note (Addendum)
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  The omentum was able to be reduced back to the abdominal cavity.  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  It was sent to pathology for evaluation.  Procedure: The patient was brought to the operating identifies correct patient.  He is placed upon the operating table and general anesthesia was induced.  His abdomen was then prepped and draped in usual sterile fashion.  His previous excoriated skin was healing.  The umbilicus itself was quite firm and hard.  We made an elliptical incision of the patient's midline incorporating the entire umbilicus with a scalpel.  This was then taken down to the hernia sac circumferentially with electrocautery and the overlying skin and umbilicus was completely removed.  The hernia sac was entered and was found to contain a large amount of omentum.  The adhesions were freed up circumferentially with the electrocautery and the omentum could finally be reduced with some difficulty back into the abdominal cavity.  There was still some hernia sac at the umbilicus and as we are excising this there appeared to be a tubular  structure going right up into where the umbilicus skin was.  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  I found what appeared to be the end and clamped it with a right angle clamp transected it and then closed the end with a 2-0 silk suture.  I then sent the tubular structure to pathology for evaluation.  I cannot completely rule out this was the appendix without doing a formal exploratory laparotomy.  We decided to hold on this given the small fascial defect.  A 6.4 cm round ventral Prolene patch was brought to the field.  It was placed through the fascia opening and then pulled up against the peritoneum with a stay ties.  The mesh was then sewn in place circumferentially with 0 Novafil sutures.  The fascia was then closed over top of the mesh with figure-of-eight #1 Ethibond sutures.  Wide coverage of the fascial defect appeared to be achieved.  Hemostasis also appeared to be achieved.  We irrigated the wound with saline.  The skin and fascia were then anesthetized with Marcaine.  The subcutaneous tissue was then closed with interrupted 3-0 Vicryl sutures and the skin was closed skin staples.  A honeycomb dressing was then applied.  The patient tolerated the procedure well.  All the counts were correct at the end of the procedure.  The patient was then extubated in the operating room and taken in a stable condition to the recovery room.  Coralie Keens   Date: 04/09/2022  Time: 10:53 AM

## 2022-04-09 NOTE — Anesthesia Procedure Notes (Signed)
Procedure Name: Intubation Date/Time: 04/09/2022 9:41 AM  Performed by: Eligha Bridegroom, CRNAPre-anesthesia Checklist: Patient identified, Emergency Drugs available and Suction available Patient Re-evaluated:Patient Re-evaluated prior to induction Oxygen Delivery Method: Circle system utilized Induction Type: Rapid sequence Laryngoscope Size: Mac and 4 Grade View: Grade I Tube type: Oral Tube size: 7.5 mm Number of attempts: 1 Airway Equipment and Method: Stylet Secured at: 23 cm Tube secured with: Tape Dental Injury: Teeth and Oropharynx as per pre-operative assessment

## 2022-04-09 NOTE — Inpatient Diabetes Management (Signed)
Inpatient Diabetes Program Recommendations  AACE/ADA: New Consensus Statement on Inpatient Glycemic Control (2015)  Target Ranges:  Prepandial:   less than 140 mg/dL      Peak postprandial:   less than 180 mg/dL (1-2 hours)      Critically ill patients:  140 - 180 mg/dL   Lab Results  Component Value Date   GLUCAP 277 (H) 04/09/2022   HGBA1C 11.1 (H) 03/15/2021    Review of Glycemic Control  Latest Reference Range & Units 04/09/22 10:58 04/09/22 12:47  Glucose-Capillary 70 - 99 mg/dL 318 (H) 277 (H)  (H): Data is abnormally high Diabetes history: Type 2 DM Outpatient Diabetes medications: Toujeo 60 units QD, Humalog 20 units BID Current orders for Inpatient glycemic control: Novolog 0-14 untis Q2H  Inpatient Diabetes Program Recommendations:    A1C in process. Plan for Novolog 0-20 units TID & HS following PACU.  Consider also adding Semglee 30 units QD.   Thanks, Bronson Curb, MSN, RNC-OB Diabetes Coordinator [phone] (8a-5p)

## 2022-04-10 ENCOUNTER — Encounter (HOSPITAL_COMMUNITY): Payer: Self-pay | Admitting: Surgery

## 2022-04-10 DIAGNOSIS — K420 Umbilical hernia with obstruction, without gangrene: Secondary | ICD-10-CM | POA: Diagnosis not present

## 2022-04-10 LAB — GLUCOSE, CAPILLARY
Glucose-Capillary: 256 mg/dL — ABNORMAL HIGH (ref 70–99)
Glucose-Capillary: 335 mg/dL — ABNORMAL HIGH (ref 70–99)
Glucose-Capillary: 187 mg/dL — ABNORMAL HIGH (ref 70–99)
Glucose-Capillary: 230 mg/dL — ABNORMAL HIGH (ref 70–99)

## 2022-04-10 MED ORDER — INSULIN GLARGINE-YFGN 100 UNIT/ML ~~LOC~~ SOLN
20.0000 [IU] | Freq: Every day | SUBCUTANEOUS | Status: DC
  Administered 2022-04-10 – 2022-04-11 (×2): 20 [IU] via SUBCUTANEOUS
  Filled 2022-04-10 (×2): qty 0.2

## 2022-04-10 MED ORDER — LIVING WELL WITH DIABETES BOOK
Freq: Once | Status: AC
  Filled 2022-04-10: qty 1

## 2022-04-10 MED ORDER — RIVAROXABAN 20 MG PO TABS
20.0000 mg | ORAL_TABLET | Freq: Every day | ORAL | Status: DC
  Administered 2022-04-10 – 2022-04-11 (×2): 20 mg via ORAL
  Filled 2022-04-10 (×2): qty 1

## 2022-04-10 MED ORDER — CLOPIDOGREL BISULFATE 75 MG PO TABS
75.0000 mg | ORAL_TABLET | Freq: Every day | ORAL | Status: DC
  Administered 2022-04-10 – 2022-04-11 (×2): 75 mg via ORAL
  Filled 2022-04-10 (×2): qty 1

## 2022-04-10 NOTE — Inpatient Diabetes Management (Signed)
Inpatient Diabetes Program Recommendations  AACE/ADA: New Consensus Statement on Inpatient Glycemic Control (2015)  Target Ranges:  Prepandial:   less than 140 mg/dL      Peak postprandial:   less than 180 mg/dL (1-2 hours)      Critically ill patients:  140 - 180 mg/dL   Lab Results  Component Value Date   GLUCAP 335 (H) 04/10/2022   HGBA1C 14.2 (H) 04/09/2022    Review of Glycemic Control  Latest Reference Range & Units 04/09/22 17:00 04/09/22 21:07 04/10/22 08:05 04/10/22 11:47  Glucose-Capillary 70 - 99 mg/dL 217 (H) 290 (H) 256 (H) 335 (H)  (H): Data is abnormally high  Diabetes history:  DM2  Outpatient Diabetes medications:  Toujeo 60 units QAM Humalog 20 units with BF and dinner  Current orders for Inpatient glycemic control: Semglee 20 units QD Novolog 0-20 units TID and 0-5 QHS  Inpatient Diabetes Program Recommendations:    Semglee 20 units BID Novolog 4 units TID with meals  Spoke with patient at bedside.  Reviewed patient's current A1c of 14.2% (average blood glucose of 360 mg/dL). Explained what a A1c is and what it measures. Also reviewed goal A1c with patient, importance of good glucose control @ home, and blood sugar goals.  He does drink occasional Sundrops and sodas.  He does not watch his diet nor check his glucose.  PCP has moved to [place].  He lives near [place] but would like to see a doctor in [place].  Explained importance of establishing with a PCP for DM management.  He asked if we could help him find a doctor.  Will place [facility] consult but also told him he can call number on back of insurance card to see who is in network with his insurance.    He does eat lunch and recommend meal coverage with Humalog with lunch as well.    He is interested in the [employer].  He does not have a cell phone so would need prescription for the [place] 2 with the reader.    Educated on The Plate Method, CHO's, portion control, CBGs at home fasting and mid  afternoon, F/U with PCP every 3 months, bring meter to PCP office, long and short term complications of uncontrolled BG, and importance of exercise.  Ordered LWWD booklet.    Will continue to follow while inpatient.  Thank you, Reche Dixon, MSN, [place] Diabetes Coordinator Inpatient Diabetes Program [phone] (team pager from 8a-5p)

## 2022-04-10 NOTE — Progress Notes (Signed)
Mobility Specialist - Progress Note   04/10/22 1014  Mobility  Activity Ambulated independently in hallway  Level of Assistance Modified independent, requires aide device or extra time  Assistive Device Other (Comment) (IV Pole)  Distance Ambulated (ft) 200 ft  Activity Response Tolerated well  $Mobility charge 1 Mobility    Pt received in bed agreeable to mobility. Left EOB w/ call bell in reach and all needs met.   Paulla Dolly Mobility Specialist

## 2022-04-10 NOTE — Care Management Obs Status (Signed)
[place] NOTIFICATION   Patient Details  Name: Chad Maddox MRN: [id number] Date of Birth: [birth year]   Medicare Observation Status Notification Given:  Yes    Carles Collet, RN 04/10/2022, 9:55 AM

## 2022-04-10 NOTE — Progress Notes (Signed)
1 Day Post-Op   Subjective/Chief Complaint: Complains of moderate incisional pain   Objective: Vital signs in last 24 hours: Temp:  [97.5 F (36.4 C)-98.7 F (37.1 C)] 98.7 F (37.1 C) (09/20 0808) Pulse Rate:  [69-95] 83 (09/20 0808) Resp:  [10-20] 17 (09/20 0808) BP: (103-138)/(61-99) 126/77 (09/20 0808) SpO2:  [92 %-100 %] 99 % (09/20 0808)    Intake/Output from previous day: 09/19 0701 - 09/20 0700 In: 2484.3 [P.O.:480; I.V.:2004.3] Out: -  Intake/Output this shift: No intake/output data recorded.  Exam: Awake and alert Looks uncomfortable Abdomen soft, obese, moderate drainage on dressing  Lab Results:  Recent Labs    04/09/22 0909  WBC 11.1*  HGB 15.5  HCT 48.2  PLT 174   BMET Recent Labs    04/09/22 0909  NA 131*  K 4.5  CL 98  CO2 21*  GLUCOSE 367*  BUN 17  CREATININE 0.70  CALCIUM 9.1   PT/INR No results for input(s): "LABPROT", "INR" in the last 72 hours. ABG No results for input(s): "PHART", "HCO3" in the last 72 hours.  Invalid input(s): "PCO2", "PO2"  Studies/Results: No results found.  Anti-infectives: Anti-infectives (From admission, onward)    Start     Dose/Rate Route Frequency Ordered Stop   04/09/22 0815  ciprofloxacin (CIPRO) IVPB 400 mg  Status:  Discontinued        400 mg 200 mL/hr over 60 Minutes Intravenous On call to O.R. 04/09/22 0809 04/09/22 1346       Assessment/Plan: s/p Procedure(s): OPEN UMBILICAL HERNIA REPAIR WITH MESH (N/A)  Given his pain from surgery, the need to resume both his Xeralto and plavix, and the possibility of a urachal cyst found at surgery, he needs to stay until at least tomorrow      Coralie Keens MD 04/10/2022

## 2022-04-11 DIAGNOSIS — K420 Umbilical hernia with obstruction, without gangrene: Secondary | ICD-10-CM | POA: Diagnosis not present

## 2022-04-11 LAB — GLUCOSE, CAPILLARY: Glucose-Capillary: 236 mg/dL — ABNORMAL HIGH (ref 70–99)

## 2022-04-11 LAB — SURGICAL PATHOLOGY

## 2022-04-11 MED ORDER — OXYCODONE HCL 5 MG PO TABS: 5.0000 mg | ORAL_TABLET | Freq: Four times a day (QID) | ORAL | 0 refills | Status: AC | PRN

## 2022-04-11 NOTE — Inpatient Diabetes Management (Signed)
Inpatient Diabetes Program Recommendations  AACE/ADA: New Consensus Statement on Inpatient Glycemic Control (2015)  Target Ranges:  Prepandial:   less than 140 mg/dL      Peak postprandial:   less than 180 mg/dL (1-2 hours)      Critically ill patients:  140 - 180 mg/dL   Lab Results  Component Value Date   GLUCAP 236 (H) 04/11/2022   HGBA1C 14.2 (H) 04/09/2022    Review of Glycemic Control  Diabetes history: DM2 Outpatient Diabetes medications: Toujeo 60 QAM, Humalog 20 units with breakfast and dinner Current orders for Inpatient glycemic control: Semglee 20 QD, Novolog 0-20 TID with meals and 0-5 HS  Inpatient Diabetes Program Recommendations:    Increase Semglee to 25 units BID  Add Novolog 6 units TID with meals if eating > 50%  Continue to follow.  Thank you. Lorenda Peck, RD, LDN, [place] Inpatient Diabetes Coordinator [phone]

## 2022-04-11 NOTE — Discharge Instructions (Signed)
[facility] _______[facility]  UMBILICAL OR INGUINAL HERNIA REPAIR: POST OP INSTRUCTIONS  Always review your discharge instruction sheet given to you by the facility where your surgery was performed. IF YOU HAVE DISABILITY OR FAMILY LEAVE FORMS, YOU MUST BRING THEM TO THE OFFICE FOR PROCESSING.   DO NOT GIVE THEM TO YOUR DOCTOR.  1. A  prescription for pain medication may be given to you upon discharge.  Take your pain medication as prescribed, if needed.  If narcotic pain medicine is not needed, then you may take acetaminophen (Tylenol) or ibuprofen (Advil) as needed. 2. Take your usually prescribed medications unless otherwise directed. If you need a refill on your pain medication, please contact your pharmacy.  They will contact our office to request authorization. Prescriptions will not be filled after 5 pm or on week-ends. 3. You should follow a light diet the first 24 hours after arrival home, such as soup and crackers, etc.  Be sure to include lots of fluids daily.  Resume your normal diet the day after surgery. 4.Most patients will experience some swelling and bruising around the umbilicus or in the groin and scrotum.  Ice packs and reclining will help.  Swelling and bruising can take several days to resolve.  6. It is common to experience some constipation if taking pain medication after surgery.  Increasing fluid intake and taking a stool softener (such as Colace) will usually help or prevent this problem from occurring.  A mild laxative (Milk of Magnesia or Miralax) should be taken according to package directions if there are no bowel movements after 48 hours. 7. Unless discharge instructions indicate otherwise, you may remove your bandages 24-48 hours after surgery, and you may shower at that time.  You may have steri-strips (small skin tapes) in place directly over the incision.  These strips should be left on the skin for 7-10 days.  If your surgeon used skin glue on the  incision, you may shower in 24 hours.  The glue will flake off over the next 2-3 weeks.  Any sutures or staples will be removed at the office during your follow-up visit. 8. ACTIVITIES:  You may resume regular (light) daily activities beginning the next day--such as daily self-care, walking, climbing stairs--gradually increasing activities as tolerated.  You may have sexual intercourse when it is comfortable.  Refrain from any heavy lifting or straining until approved by your doctor.  a.You may drive when you are no longer taking prescription pain medication, you can comfortably wear a seatbelt, and you can safely maneuver your car and apply brakes. b.RETURN TO WORK:   _____________________________________________  9.You should see your doctor in the office for a follow-up appointment approximately 2-3 weeks after your surgery.  Make sure that you call for this appointment within a day or two after you arrive home to insure a convenient appointment time. 10.OTHER INSTRUCTIONS: __OK TO SHOWER ICE PACK, TYLENOL, AND IBUPROFEN ALSO FOR PAIN NO LIFTING MORE THAN 15 POUNDS FOR 6 WEEKS_______________________    _____________________________________  WHEN TO CALL YOUR DOCTOR: Fever over 101.0 Inability to urinate Nausea and/or vomiting Extreme swelling or bruising Continued bleeding from incision. Increased pain, redness, or drainage from the incision  The clinic staff is available to answer your questions during regular business hours.  Please don't hesitate to call and ask to speak to one of the nurses for clinical concerns.  If you have a medical emergency, go to the nearest emergency room or call 911.  A surgeon  from [facility] is always on call at the hospital   [address] [place], [place], [place]  [address] ?  [address], [place], [place]   [address] [phone] ? [phone] ? FAX [phone] Web site: [email or web address]

## 2022-04-11 NOTE — Plan of Care (Signed)

## 2022-04-11 NOTE — Discharge Summary (Signed)
Physician Discharge Summary  Patient ID: Chad Maddox MRN: [id number] DOB/AGE: [birth year] 65 y.o.  Admit date: 04/09/2022 Discharge date: 04/11/2022  Admission Diagnoses:  Discharge Diagnoses:  Principal Problem:   Incarcerated umbilical hernia   Discharged Condition: good  Hospital Course: Uneventful post op recovery.  Discharged home POD#2  Consults: None  Significant Diagnostic Studies:   Treatments: surgery: umbilical hernia repair with mesh  Discharge Exam: Blood pressure (!) 153/88, pulse 73, temperature (!) 97.2 F (36.2 C), temperature source Oral, resp. rate 16, height 5\' 8"  (1.727 m), weight 104.3 kg, SpO2 94 %. General appearance: alert, cooperative, and no distress Resp: clear to auscultation bilaterally Cardio: regularly irregular rhythm Incision/Wound: incision clean, staples intact  Disposition: Discharge disposition: 01-Home or Self Care        Allergies as of 04/11/2022       Reactions   Shellfish Allergy Shortness Of Breath, Swelling   Tylenol [acetaminophen] Nausea And Vomiting   Makes his stomach upset   Penicillins Other (See Comments)   Occurred as an infant; reaction unknown.        Medication List     STOP taking these medications    HYDROcodone-acetaminophen 7.5-325 MG tablet Commonly known as: NORCO   insulin lispro 100 UNIT/ML KwikPen Commonly known as: HUMALOG       TAKE these medications    amLODipine 2.5 MG tablet Commonly known as: NORVASC TAKE ONE TABLET BY MOUTH DAILY   clopidogrel 75 MG tablet Commonly known as: Plavix Take 1 tablet (75 mg total) by mouth daily.   CoQ10 100 MG Caps Take 100 mg by mouth daily.   ezetimibe 10 MG tablet Commonly known as: ZETIA TAKE 1 TABLET BY MOUTH DAILY   gabapentin 100 MG capsule Commonly known as: NEURONTIN Take 100 mg by mouth 2 (two) times daily.   lidocaine 5 % Commonly known as: Lidoderm Place 1 patch onto the skin daily. Remove & Discard patch within  12 hours or as directed by MD   metoprolol tartrate 25 MG tablet Commonly known as: LOPRESSOR Take 12.5 mg by mouth 2 (two) times daily.   nitroGLYCERIN 0.4 MG SL tablet Commonly known as: NITROSTAT Place 1 tablet (0.4 mg total) under the tongue every 5 (five) minutes as needed. What changed: reasons to take this   oxyCODONE 5 MG immediate release tablet Commonly known as: Oxy IR/ROXICODONE Take 1 tablet (5 mg total) by mouth every 6 (six) hours as needed for moderate pain, severe pain or breakthrough pain.   rosuvastatin 40 MG tablet Commonly known as: CRESTOR Take 1 tablet (40 mg total) by mouth daily.   Toujeo SoloStar 300 UNIT/ML Solostar Pen Generic drug: insulin glargine (1 Unit Dial) Inject 60 Units into the skin daily.   Xarelto 20 MG Tabs tablet Generic drug: rivaroxaban TAKE 1 TABLET BY MOUTH DAILY WITH SUPPER        Follow-up Information     Coralie Keens, MD Follow up on 04/22/2022.   Specialty: General Surgery Why: For suture removal Contact information: [address] [place] [address] [phone]                 Signed: Coralie Keens 04/11/2022, 9:06 AM

## 2022-04-11 NOTE — Progress Notes (Signed)
Patient stated understanding for discharge instructions.

## 2022-04-11 NOTE — TOC Initial Note (Signed)
Transition of Care [facility]) - Initial/Assessment Note    Patient Details  Name: Chad Maddox MRN: [id number] Date of Birth: April 04, 1956  [facility] CM/SW Contact:    Kingsley Plan, RN Phone Number: 04/11/2022, 9:53 AM  Clinical Narrative:                 Spoke to patient at bedside. Confirmed face sheet information.   Patient requesting help in finding a PCP. Explained if he knows of a PCP he can call office directly to see if they are accepting new patient's with his insurance. He does not.  Explained he can call number on insurance card and be provided a list of providers in network. PAtient prefers not to do that.   Patient insists that there is a doctor at Dr Donivan Scull office who provides primary care. NCM called [facility] office from patient's room and confirmed they do not provide primary care.   Patient requesting Primary Care Provider in same office building as Dr Barrie Dunker. NCM could not find PCP located at Dr Eliberto Ivory office via internet. NCM called some offices at that location and confirmed  no PCP at that location.   Patient aware. Requesting NCM to schedule a follow up appointment with PCP at any location on [facility].   NCM called [facility] , [address] [place] [address] Phone [phone] . Spoke to [place] scheduled appointment for Wednesday April 17, 2022 at 3:20 pm with Dr Leonard Downing Peri Maris . Information placed on AVS   Expected Discharge Plan: Home/Self Care Barriers to Discharge: No Barriers Identified   Patient Goals and CMS Choice Patient states their goals for this hospitalization and ongoing recovery are:: to return to home      Expected Discharge Plan and Services Expected Discharge Plan: Home/Self Care In-house Referral: PCP / [facility] Discharge Planning Services: CM Consult   Living arrangements for the past 2 months: Single Family Home Expected Discharge Date:  04/11/22                 DME Agency: NA       HH Arranged: NA          Prior Living Arrangements/Services Living arrangements for the past 2 months: Single Family Home Lives with:: Spouse   Do you feel safe going back to the place where you live?: Yes      Need for Family Participation in Patient Care: Yes (Comment) Care giver support system in place?: Yes (comment)   Criminal Activity/Legal Involvement Pertinent to Current Situation/Hospitalization: No - Comment as needed  Activities of Daily Living Home Assistive Devices/Equipment: CBG Meter, Eyeglasses ADL Screening (condition at time of admission) Patient's cognitive ability adequate to safely complete daily activities?: Yes Is the patient deaf or have difficulty hearing?: Yes Does the patient have difficulty seeing, even when wearing glasses/contacts?: Yes Does the patient have difficulty concentrating, remembering, or making decisions?: No Patient able to express need for assistance with ADLs?: Yes Does the patient have difficulty dressing or bathing?: No Independently performs ADLs?: Yes (appropriate for developmental age) Does the patient have difficulty walking or climbing stairs?: Yes Weakness of Legs: None Weakness of Arms/Hands: None  Permission Sought/Granted   Permission granted to share information with : No              Emotional Assessment Appearance:: Appears stated age Attitude/Demeanor/Rapport: Engaged Affect (typically observed): Accepting Orientation: : Oriented to Situation, Oriented  to  Time, Oriented to Place, Oriented to Self Alcohol / Substance Use: Not Applicable Psych Involvement: No (comment)  Admission diagnosis:  Incarcerated umbilical hernia [U27.2] Patient Active Problem List   Diagnosis Date Noted   Incarcerated umbilical hernia 53/66/4403   Critical limb ischemia of right lower extremity ([place]) 12/18/2021   Pain in left wrist 04/16/2021   Sepsis ([place]) 12/15/2019    Cellulitis of right foot 12/15/2019   Atrial fibrillation with RVR ([place]) 12/15/2019   Hyperlipidemia 04/19/2016   HYPERTENSION, BENIGN 07/12/2010   CAD, NATIVE VESSEL 06/23/2009   Diabetes mellitus type 2 in obese ([place]) 10/28/2008   OBESITY 10/28/2008   LEUKOCYTOSIS 10/28/2008   GERD 10/28/2008   PCP:  Pcp, No Pharmacy:   [facility] - [place], [place] [place] [address] Phone: [phone] Fax: [phone]  [id number] - [place] [address], [address] [place] [address] Phone: [phone] Fax: [phone]  [facility] [address] [address] Phone: [phone] Fax: [phone]     Social Determinants of Health (SDOH) Interventions    Readmission Risk Interventions     No data to display

## 2022-06-18 ENCOUNTER — Ambulatory Visit: Payer: Medicare Other | Admitting: Internal Medicine

## 2022-07-29 ENCOUNTER — Ambulatory Visit: Payer: Medicare Other | Admitting: Orthopedic Surgery
